# Patient Record
Sex: Male | Born: 1960
Health system: Southern US, Community
[De-identification: ages and names within clinical notes are randomized; demographics above are authoritative.]

## PROBLEM LIST (undated history)

## (undated) DIAGNOSIS — E785 Hyperlipidemia, unspecified: Secondary | ICD-10-CM

## (undated) DIAGNOSIS — M545 Low back pain, unspecified: Secondary | ICD-10-CM

## (undated) DIAGNOSIS — Z86010 Personal history of colonic polyps: Principal | ICD-10-CM

## (undated) DIAGNOSIS — I7121 Aneurysm of the ascending aorta, without rupture: Secondary | ICD-10-CM

## (undated) DIAGNOSIS — M1990 Unspecified osteoarthritis, unspecified site: Secondary | ICD-10-CM

## (undated) HISTORY — DX: Hyperlipidemia, unspecified: E78.5

## (undated) HISTORY — DX: Aneurysm of the ascending aorta, without rupture: I71.21

## (undated) HISTORY — PX: POLYPECTOMY: SHX149

## (undated) HISTORY — DX: Unspecified osteoarthritis, unspecified site: M19.90

## (undated) HISTORY — DX: Low back pain, unspecified: M54.50

## (undated) HISTORY — PX: HERNIA REPAIR: SHX51

## (undated) HISTORY — DX: Personal history of colonic polyps: Z86.010

## (undated) HISTORY — DX: Low back pain: M54.5

---

## 2003-07-14 ENCOUNTER — Inpatient Hospital Stay (HOSPITAL_COMMUNITY): Admission: AD | Admit: 2003-07-14 | Discharge: 2003-07-16 | Payer: Self-pay | Admitting: Family Medicine

## 2003-07-19 ENCOUNTER — Encounter: Admission: RE | Admit: 2003-07-19 | Discharge: 2003-07-19 | Payer: Self-pay | Admitting: Family Medicine

## 2003-09-17 ENCOUNTER — Ambulatory Visit (HOSPITAL_COMMUNITY): Admission: RE | Admit: 2003-09-17 | Discharge: 2003-09-17 | Payer: Self-pay | Admitting: Gastroenterology

## 2003-09-17 HISTORY — PX: COLONOSCOPY: SHX174

## 2012-11-24 ENCOUNTER — Telehealth: Payer: Self-pay | Admitting: Family Medicine

## 2012-11-24 ENCOUNTER — Other Ambulatory Visit: Payer: Self-pay | Admitting: Family Medicine

## 2012-11-24 DIAGNOSIS — M545 Low back pain, unspecified: Secondary | ICD-10-CM

## 2012-11-24 MED ORDER — METAXALONE 800 MG PO TABS: 800.0000 mg | ORAL_TABLET | Freq: Three times a day (TID) | ORAL | Status: DC

## 2012-11-24 NOTE — Telephone Encounter (Signed)
Needs skelaxin for low back pain which I escribed to [facility] and instructed he NTBS if worsening.

## 2012-11-24 NOTE — Telephone Encounter (Signed)
LMTRC twice

## 2013-02-23 ENCOUNTER — Telehealth: Payer: Self-pay | Admitting: Family Medicine

## 2013-02-23 NOTE — Telephone Encounter (Signed)
Atorvastatin 40mg  [facility]  #90

## 2013-02-24 MED ORDER — ATORVASTATIN CALCIUM 40 MG PO TABS: 40.0000 mg | ORAL_TABLET | Freq: Every day | ORAL | Status: DC

## 2013-02-24 NOTE — Telephone Encounter (Signed)
CPE appt in November.  One refill #90 sent

## 2013-03-30 ENCOUNTER — Other Ambulatory Visit: Payer: BC Managed Care – PPO

## 2013-03-30 DIAGNOSIS — Z0000 Encounter for general adult medical examination without abnormal findings: Secondary | ICD-10-CM

## 2013-03-30 LAB — CBC WITH DIFFERENTIAL/PLATELET
Eosinophils Relative: 5 % (ref 0–5)
HCT: 45.3 % (ref 39.0–52.0)
Hemoglobin: 15.4 g/dL (ref 13.0–17.0)
Lymphocytes Relative: 28 % (ref 12–46)
Lymphs Abs: 1.6 10*3/uL (ref 0.7–4.0)
MCV: 87.5 fL (ref 78.0–100.0)
Monocytes Absolute: 0.5 10*3/uL (ref 0.1–1.0)
RBC: 5.18 MIL/uL (ref 4.22–5.81)
WBC: 5.9 10*3/uL (ref 4.0–10.5)

## 2013-03-30 LAB — COMPREHENSIVE METABOLIC PANEL
ALT: 17 U/L (ref 0–53)
BUN: 21 mg/dL (ref 6–23)
CO2: 26 mEq/L (ref 19–32)
Calcium: 9.3 mg/dL (ref 8.4–10.5)
Chloride: 107 mEq/L (ref 96–112)
Creat: 0.82 mg/dL (ref 0.50–1.35)

## 2013-03-30 LAB — LIPID PANEL
Cholesterol: 163 mg/dL (ref 0–200)
Total CHOL/HDL Ratio: 3.5 Ratio

## 2013-04-07 ENCOUNTER — Encounter: Payer: Self-pay | Admitting: Family Medicine

## 2013-04-07 ENCOUNTER — Ambulatory Visit (INDEPENDENT_AMBULATORY_CARE_PROVIDER_SITE_OTHER): Payer: BC Managed Care – PPO | Admitting: Family Medicine

## 2013-04-07 VITALS — BP 110/70 | HR 66 | Temp 97.8°F | Resp 14 | Ht 73.0 in | Wt 223.0 lb

## 2013-04-07 DIAGNOSIS — M545 Low back pain, unspecified: Secondary | ICD-10-CM | POA: Insufficient documentation

## 2013-04-07 DIAGNOSIS — R7303 Prediabetes: Secondary | ICD-10-CM | POA: Insufficient documentation

## 2013-04-07 DIAGNOSIS — Z23 Encounter for immunization: Secondary | ICD-10-CM

## 2013-04-07 DIAGNOSIS — Z0000 Encounter for general adult medical examination without abnormal findings: Secondary | ICD-10-CM

## 2013-04-07 DIAGNOSIS — R7309 Other abnormal glucose: Secondary | ICD-10-CM

## 2013-04-07 MED ORDER — ATORVASTATIN CALCIUM 40 MG PO TABS: 40.0000 mg | ORAL_TABLET | Freq: Every day | ORAL | Status: DC

## 2013-04-07 MED ORDER — METAXALONE 800 MG PO TABS: 800.0000 mg | ORAL_TABLET | Freq: Three times a day (TID) | ORAL | Status: DC

## 2013-04-07 NOTE — Addendum Note (Signed)
Addended by: Legrand Rams B on: 04/07/2013 10:02 AM   Modules accepted: Orders

## 2013-04-07 NOTE — Progress Notes (Signed)
Subjective:    [name], male    DOB: 11-Feb-1961, 52 y.o.   MRN: [id number]  HPI  Patient is here today for a complete physical exam. His lab work is listed below. Is significant for a fasting blood sugar of 111. Otherwise his labs are outstanding. His colonoscopy was in 2005. He is due for a tetanus shot and a flu shot. He is due for his prostate exam. He does have some mild low back pain that migrates and is exacerbated by heavy lifting. He denies any symptoms of sciatica. He denies any dysuria. He denies any hematuria. He denies any fevers. Past Medical History  Diagnosis Date  . Hyperlipidemia   . Low back pain    No current outpatient prescriptions on file prior to visit.   No current facility-administered medications on file prior to visit.   Allergies  Allergen Reactions  . Niaspan [Niacin Er]     Severe flushing   History   Social History  . Marital Status: Married    Spouse Name: N/A    Number of Children: N/A  . Years of Education: N/A   Occupational History  . Not on file.   Social History Main Topics  . Smoking status: Never Smoker   . Smokeless tobacco: Never Used  . Alcohol Use: No  . Drug Use: No  . Sexual Activity: Yes     Comment: married.  Works in Data processing manager, Set designer   Other Topics Concern  . Not on file   Social History Narrative  . No narrative on file   Family History  Problem Relation Age of Onset  . Alcohol abuse Brother   . Hyperlipidemia Brother      Review of Systems  All other systems reviewed and are negative.       Objective:   Physical Exam  Vitals reviewed. Constitutional: He is oriented to person, place, and time. He appears well-developed and well-nourished. No distress.  HENT:  Head: Normocephalic and atraumatic.  Right Ear: External ear normal.  Left Ear: External ear normal.  Nose: Nose normal.  Mouth/Throat: Oropharynx is clear and moist. No oropharyngeal exudate.  Eyes: Conjunctivae  and EOM are normal. Pupils are equal, round, and reactive to light. Right eye exhibits no discharge. Left eye exhibits no discharge. No scleral icterus.  Neck: Normal range of motion. Neck supple. No JVD present. No thyromegaly present.  Cardiovascular: Normal rate, regular rhythm and normal heart sounds.  Exam reveals no gallop and no friction rub.   No murmur heard. Pulmonary/Chest: Effort normal and breath sounds normal. No stridor. No respiratory distress. He has no wheezes. He has no rales. He exhibits no tenderness.  Abdominal: Soft. Bowel sounds are normal. He exhibits no distension and no mass. There is no tenderness. There is no rebound and no guarding.  Genitourinary: Rectum normal, prostate normal and penis normal. Guaiac negative stool. No penile tenderness.  Musculoskeletal: Normal range of motion. He exhibits no edema and no tenderness.  Lymphadenopathy:    He has no cervical adenopathy.  Neurological: He is alert and oriented to person, place, and time. He has normal reflexes. He displays normal reflexes. No cranial nerve deficit. He exhibits normal muscle tone. Coordination normal.  Skin: Skin is warm. Rash noted. He is not diaphoretic. No erythema. No pallor.  Psychiatric: He has a normal mood and affect. His behavior is normal. Judgment and thought content normal.   there is a 2 cm erythematous superficial sore on the  left side of his gluteal cleft.  It appears to be irritated.        Assessment & Plan:  1. Low back pain Sounds musculoskeletal. He can take Skelaxin 800 mg 3 times a day as needed for pain. - metaxalone (SKELAXIN) 800 MG tablet; Take 1 tablet (800 mg total) by mouth 3 (three) times daily. As needed for low back pain  Dispense: 30 tablet; Refill: 0  2. Routine general medical examination at a health care facility Patient's physical exam is normal. Give the patient a flu shot as well as aTdAP.  Also send the patient home with fecal occult blood cards. If  negative he will not need a colonoscopy until next year. I recommended 10 pounds weight loss, 15 minutes a day of aerobic exercise. We will decrease his Lipitor to 20 mg a day. Recheck his blood sugar and fasting lipid panel in 6 months.  I recommended he apply Polysporin daily to the irritated area on his left gluteal cleft 1 month. Recheck then. If persistent I would recommend shave biopsy. - Fecal occult blood, imunochemical  3. Prediabetes Stent 5-10 minutes discussing low carbohydrate diet and increasing aerobic exercise. Recheck blood sugar in 6 months. Decrease Lipitor to 20 mg a day.

## 2013-08-11 ENCOUNTER — Other Ambulatory Visit: Payer: Self-pay | Admitting: Family Medicine

## 2013-08-11 ENCOUNTER — Encounter: Payer: Self-pay | Admitting: Family Medicine

## 2013-08-11 DIAGNOSIS — M545 Low back pain, unspecified: Secondary | ICD-10-CM

## 2013-08-11 MED ORDER — METAXALONE 800 MG PO TABS: 800.0000 mg | ORAL_TABLET | Freq: Three times a day (TID) | ORAL | Status: DC

## 2013-10-05 ENCOUNTER — Other Ambulatory Visit: Payer: BC Managed Care – PPO

## 2013-10-05 DIAGNOSIS — Z79899 Other long term (current) drug therapy: Secondary | ICD-10-CM

## 2013-10-05 DIAGNOSIS — Z0000 Encounter for general adult medical examination without abnormal findings: Secondary | ICD-10-CM

## 2013-10-05 LAB — CBC WITH DIFFERENTIAL/PLATELET
Basophils Absolute: 0.1 10*3/uL (ref 0.0–0.1)
Basophils Relative: 1 % (ref 0–1)
EOS PCT: 5 % (ref 0–5)
Eosinophils Absolute: 0.3 10*3/uL (ref 0.0–0.7)
HEMATOCRIT: 41.4 % (ref 39.0–52.0)
HEMOGLOBIN: 13.9 g/dL (ref 13.0–17.0)
LYMPHS PCT: 30 % (ref 12–46)
Lymphs Abs: 1.5 10*3/uL (ref 0.7–4.0)
MCH: 29.1 pg (ref 26.0–34.0)
MCHC: 33.6 g/dL (ref 30.0–36.0)
MCV: 86.6 fL (ref 78.0–100.0)
MONO ABS: 0.4 10*3/uL (ref 0.1–1.0)
MONOS PCT: 8 % (ref 3–12)
NEUTROS ABS: 2.8 10*3/uL (ref 1.7–7.7)
Neutrophils Relative %: 56 % (ref 43–77)
Platelets: 198 10*3/uL (ref 150–400)
RBC: 4.78 MIL/uL (ref 4.22–5.81)
RDW: 14.1 % (ref 11.5–15.5)
WBC: 5 10*3/uL (ref 4.0–10.5)

## 2013-10-05 LAB — COMPLETE METABOLIC PANEL WITH GFR
ALBUMIN: 4.3 g/dL (ref 3.5–5.2)
ALT: 16 U/L (ref 0–53)
AST: 17 U/L (ref 0–37)
Alkaline Phosphatase: 49 U/L (ref 39–117)
BUN: 18 mg/dL (ref 6–23)
CALCIUM: 9.2 mg/dL (ref 8.4–10.5)
CHLORIDE: 107 meq/L (ref 96–112)
CO2: 25 meq/L (ref 19–32)
CREATININE: 0.83 mg/dL (ref 0.50–1.35)
GLUCOSE: 101 mg/dL — AB (ref 70–99)
POTASSIUM: 4.1 meq/L (ref 3.5–5.3)
Sodium: 142 mEq/L (ref 135–145)
Total Bilirubin: 0.9 mg/dL (ref 0.2–1.2)
Total Protein: 6.2 g/dL (ref 6.0–8.3)

## 2013-10-05 LAB — LIPID PANEL
Cholesterol: 155 mg/dL (ref 0–200)
HDL: 44 mg/dL (ref >39)
LDL CALC: 97 mg/dL (ref 0–99)
TRIGLYCERIDES: 71 mg/dL (ref <150)
Total CHOL/HDL Ratio: 3.5 Ratio
VLDL: 14 mg/dL (ref 0–40)

## 2013-10-06 ENCOUNTER — Encounter: Payer: Self-pay | Admitting: Family Medicine

## 2013-10-29 ENCOUNTER — Encounter: Payer: Self-pay | Admitting: Family Medicine

## 2013-10-29 DIAGNOSIS — E785 Hyperlipidemia, unspecified: Secondary | ICD-10-CM

## 2013-10-30 MED ORDER — ATORVASTATIN CALCIUM 20 MG PO TABS: 20.0000 mg | ORAL_TABLET | Freq: Every day | ORAL | Status: DC

## 2014-02-18 ENCOUNTER — Ambulatory Visit (INDEPENDENT_AMBULATORY_CARE_PROVIDER_SITE_OTHER): Payer: Medicare Other | Admitting: Family Medicine

## 2014-02-18 ENCOUNTER — Encounter: Payer: Self-pay | Admitting: Family Medicine

## 2014-02-18 VITALS — BP 112/74 | HR 80 | Temp 98.2°F | Resp 14 | Ht 73.0 in | Wt 223.0 lb

## 2014-02-18 DIAGNOSIS — Z23 Encounter for immunization: Secondary | ICD-10-CM

## 2014-02-18 DIAGNOSIS — S93601A Unspecified sprain of right foot, initial encounter: Secondary | ICD-10-CM

## 2014-02-18 MED ORDER — KETOROLAC TROMETHAMINE 10 MG PO TABS: 10.0000 mg | ORAL_TABLET | Freq: Four times a day (QID) | ORAL | Status: DC | PRN

## 2014-02-18 NOTE — Progress Notes (Signed)
   Subjective:    [name], male    DOB: [birth year], 53 y.o.   MRN: [id number]  HPI One week ago, the patient performed a substantial amount of walking while he visited his son in college. He had to walk several miles and climb steps all day long.  Ever since his foot has been edematous swollen and tender. He is primarily tender and sore and swollen over the second third and fourth metatarsal bodies and near the metatarsal tarsal joints. There is no erythema or warmth. There is visible swelling and tenderness to palpation in this area. He has full range of motion in his toes and his ankle. There is no bruising. Past Medical History  Diagnosis Date  . Hyperlipidemia   . Low back pain    No past surgical history on file. Current Outpatient Prescriptions on File Prior to Visit  Medication Sig Dispense Refill  . aspirin 81 MG tablet Take 81 mg by mouth daily.      Marland Kitchen atorvastatin (LIPITOR) 20 MG tablet Take 1 tablet (20 mg total) by mouth daily.  90 tablet  3   No current facility-administered medications on file prior to visit.   Allergies  Allergen Reactions  . Niaspan [Niacin Er]     Severe flushing   History   Social History  . Marital Status: Married    Spouse Name: N/A    Number of Children: N/A  . Years of Education: N/A   Occupational History  . Not on file.   Social History Main Topics  . Smoking status: Never Smoker   . Smokeless tobacco: Never Used  . Alcohol Use: No  . Drug Use: No  . Sexual Activity: Yes     Comment: married.  Works in Therapist, occupational, Psychologist, educational   Other Topics Concern  . Not on file   Social History Narrative  . No narrative on file      Review of Systems  All other systems reviewed and are negative.      Objective:   Physical Exam  Vitals reviewed. Cardiovascular: Normal rate and regular rhythm.   Pulmonary/Chest: Effort normal and breath sounds normal.  Musculoskeletal:       Right foot: He exhibits  tenderness, bony tenderness and swelling. He exhibits no deformity.          Assessment & Plan:  Foot sprain, right, initial encounter - Plan: ketorolac (TORADOL) 10 MG tablet   I suspect the patient sprained his midfoot. Recommended rest and elevation through the weekend. I recommended ice to 3 times a day for 10-15 minutes. I recommended Toradol 10 mg every 6 hours for no more than 5 days. The patient is no better next week, I would obtain an x-ray of the foot to rule out a stress fracture.  Patient has no history of gout and there is no evidence of such at this time. There is also no evidence of an infection.

## 2014-02-18 NOTE — Addendum Note (Signed)
Addended by: Shary Decamp B on: 02/18/2014 01:09 PM   Modules accepted: Orders

## 2014-04-05 ENCOUNTER — Other Ambulatory Visit: Payer: Medicare Other

## 2014-04-05 DIAGNOSIS — R7303 Prediabetes: Secondary | ICD-10-CM

## 2014-04-05 DIAGNOSIS — Z79899 Other long term (current) drug therapy: Secondary | ICD-10-CM

## 2014-04-05 DIAGNOSIS — Z0000 Encounter for general adult medical examination without abnormal findings: Secondary | ICD-10-CM

## 2014-04-05 DIAGNOSIS — E785 Hyperlipidemia, unspecified: Secondary | ICD-10-CM

## 2014-04-05 DIAGNOSIS — Z125 Encounter for screening for malignant neoplasm of prostate: Secondary | ICD-10-CM

## 2014-04-05 LAB — LIPID PANEL
CHOL/HDL RATIO: 3.4 ratio
Cholesterol: 148 mg/dL (ref 0–200)
HDL: 43 mg/dL (ref >39)
LDL Cholesterol: 88 mg/dL (ref 0–99)
Triglycerides: 84 mg/dL (ref <150)
VLDL: 17 mg/dL (ref 0–40)

## 2014-04-05 LAB — COMPLETE METABOLIC PANEL WITH GFR
ALK PHOS: 60 U/L (ref 39–117)
ALT: 16 U/L (ref 0–53)
AST: 19 U/L (ref 0–37)
Albumin: 4.4 g/dL (ref 3.5–5.2)
BILIRUBIN TOTAL: 1.1 mg/dL (ref 0.2–1.2)
BUN: 25 mg/dL — ABNORMAL HIGH (ref 6–23)
CO2: 26 mEq/L (ref 19–32)
Calcium: 9.2 mg/dL (ref 8.4–10.5)
Chloride: 105 mEq/L (ref 96–112)
Creat: 0.78 mg/dL (ref 0.50–1.35)
GFR, Est African American: >89 mL/min
GLUCOSE: 107 mg/dL — AB (ref 70–99)
Potassium: 4.8 mEq/L (ref 3.5–5.3)
SODIUM: 139 meq/L (ref 135–145)
TOTAL PROTEIN: 6.5 g/dL (ref 6.0–8.3)

## 2014-04-05 LAB — CBC WITH DIFFERENTIAL/PLATELET
BASOS ABS: 0 10*3/uL (ref 0.0–0.1)
Basophils Relative: 0 % (ref 0–1)
EOS ABS: 0.3 10*3/uL (ref 0.0–0.7)
EOS PCT: 5 % (ref 0–5)
HCT: 43.1 % (ref 39.0–52.0)
Hemoglobin: 14.3 g/dL (ref 13.0–17.0)
LYMPHS PCT: 30 % (ref 12–46)
Lymphs Abs: 1.5 10*3/uL (ref 0.7–4.0)
MCH: 29.5 pg (ref 26.0–34.0)
MCHC: 33.2 g/dL (ref 30.0–36.0)
MCV: 88.9 fL (ref 78.0–100.0)
Monocytes Absolute: 0.4 10*3/uL (ref 0.1–1.0)
Monocytes Relative: 8 % (ref 3–12)
Neutro Abs: 2.9 10*3/uL (ref 1.7–7.7)
Neutrophils Relative %: 57 % (ref 43–77)
PLATELETS: 194 10*3/uL (ref 150–400)
RBC: 4.85 MIL/uL (ref 4.22–5.81)
RDW: 14.1 % (ref 11.5–15.5)
WBC: 5.1 10*3/uL (ref 4.0–10.5)

## 2014-04-05 LAB — HEMOGLOBIN A1C
Hgb A1c MFr Bld: 5.6 % (ref <5.7)
Mean Plasma Glucose: 114 mg/dL (ref <117)

## 2014-04-05 LAB — TSH: TSH: 1.422 u[IU]/mL (ref 0.350–4.500)

## 2014-04-06 LAB — PSA, MEDICARE: PSA: 1.66 ng/mL

## 2014-04-12 ENCOUNTER — Encounter: Payer: Self-pay | Admitting: Family Medicine

## 2014-04-12 ENCOUNTER — Ambulatory Visit (INDEPENDENT_AMBULATORY_CARE_PROVIDER_SITE_OTHER): Payer: Medicare Other | Admitting: Family Medicine

## 2014-04-12 VITALS — BP 120/76 | HR 78 | Temp 98.4°F | Resp 16 | Ht 73.0 in | Wt 222.0 lb

## 2014-04-12 DIAGNOSIS — Z0000 Encounter for general adult medical examination without abnormal findings: Secondary | ICD-10-CM

## 2014-04-12 NOTE — Progress Notes (Signed)
Subjective:    [name], male    DOB: 11-Jun-1960, 53 y.o.   MRN: [id number]  HPI Patient is here today for complete physical exam. He does complain of some numbness in his right hand with repetitive use. 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 He denies any neck pain or arm pain. He denies any pain in his hand. The numbness is limited distal to his right wrist. He is due for a colonoscopy. Otherwise the patient is doing relatively well. He continues to have some pain and swelling in his right forefoot. I diagnosed patient with a sprain in October. He would not like to get an x-ray at this time. The pain is improving he would like to give it more time. His most recent lab work as listed below. His flu shot and his tetanus shot are up-to-date. Lab on 04/05/2014  Component Date Value Ref Range Status  . Sodium 04/05/2014 139  135 - 145 mEq/L Final  . Potassium 04/05/2014 4.8  3.5 - 5.3 mEq/L Final  . Chloride 04/05/2014 105  96 - 112 mEq/L Final  . CO2 04/05/2014 26  19 - 32 mEq/L Final  . Glucose, Bld 04/05/2014 107* 70 - 99 mg/dL Final  . BUN 04/05/2014 25* 6 - 23 mg/dL Final  . Creat 04/05/2014 0.78  0.50 - 1.35 mg/dL Final  . Total Bilirubin 04/05/2014 1.1  0.2 - 1.2 mg/dL Final  . Alkaline Phosphatase 04/05/2014 60  39 - 117 U/L Final  . AST 04/05/2014 19  0 - 37 U/L Final  . ALT 04/05/2014 16  0 - 53 U/L Final  . Total Protein 04/05/2014 6.5  6.0 - 8.3 g/dL Final  . Albumin 04/05/2014 4.4  3.5 - 5.2 g/dL Final  . Calcium 04/05/2014 9.2  8.4 - 10.5 mg/dL Final  . GFR, Est African American 04/05/2014 >89   Final  . GFR, Est Non African American 04/05/2014 >89   Final   Comment:   The estimated GFR is a calculation valid for adults (>=58 years old) that uses the CKD-EPI algorithm to adjust for age and sex. It is   not to be used for children, pregnant women, hospitalized patients,    patients on dialysis, or with rapidly changing kidney  function. According to the NKDEP, eGFR >89 is normal, 60-89 shows mild impairment, 30-59 shows moderate impairment, 15-29 shows severe impairment and <15 is ESRD.     Marland Kitchen Cholesterol 04/05/2014 148  0 - 200 mg/dL Final   Comment: ATP III Classification:       < 200        mg/dL        Desirable      200 - 239     mg/dL        Borderline High      >= 240        mg/dL        High     . Triglycerides 04/05/2014 84  <150 mg/dL Final  . HDL 04/05/2014 43  >39 mg/dL Final  . Total CHOL/HDL Ratio 04/05/2014 3.4   Final  . VLDL 04/05/2014 17  0 - 40 mg/dL Final  . LDL Cholesterol 04/05/2014 88  0 - 99 mg/dL Final   Comment:   Total Cholesterol/HDL Ratio:CHD Risk                        Coronary Heart Disease Risk Table  Men       Women          1/2 Average Risk              3.4        3.3              Average Risk              5.0        4.4           2X Average Risk              9.6        7.1           3X Average Risk             23.4       11.0 Use the calculated Patient Ratio above and the CHD Risk table  to determine the patient's CHD Risk. ATP III Classification (LDL):       < 100        mg/dL         Optimal      100 - 129     mg/dL         Near or Above Optimal      130 - 159     mg/dL         Borderline High      160 - 189     mg/dL         High       > 190        mg/dL         Very High     . WBC 04/05/2014 5.1  4.0 - 10.5 K/uL Final  . RBC 04/05/2014 4.85  4.22 - 5.81 MIL/uL Final  . Hemoglobin 04/05/2014 14.3  13.0 - 17.0 g/dL Final  . HCT 04/05/2014 43.1  39.0 - 52.0 % Final  . MCV 04/05/2014 88.9  78.0 - 100.0 fL Final  . MCH 04/05/2014 29.5  26.0 - 34.0 pg Final  . MCHC 04/05/2014 33.2  30.0 - 36.0 g/dL Final  . RDW 04/05/2014 14.1  11.5 - 15.5 % Final  . Platelets 04/05/2014 194  150 - 400 K/uL Final  . Neutrophils Relative % 04/05/2014 57  43 - 77 % Final  . Neutro Abs 04/05/2014 2.9  1.7 - 7.7 K/uL Final  . Lymphocytes  Relative 04/05/2014 30  12 - 46 % Final  . Lymphs Abs 04/05/2014 1.5  0.7 - 4.0 K/uL Final  . Monocytes Relative 04/05/2014 8  3 - 12 % Final  . Monocytes Absolute 04/05/2014 0.4  0.1 - 1.0 K/uL Final  . Eosinophils Relative 04/05/2014 5  0 - 5 % Final  . Eosinophils Absolute 04/05/2014 0.3  0.0 - 0.7 K/uL Final  . Basophils Relative 04/05/2014 0  0 - 1 % Final  . Basophils Absolute 04/05/2014 0.0  0.0 - 0.1 K/uL Final  . Smear Review 04/05/2014 Criteria for review not met   Final  . TSH 04/05/2014 1.422  0.350 - 4.500 uIU/mL Final  . Hgb A1c MFr Bld 04/05/2014 5.6  <5.7 % Final   Comment:  According to the ADA Clinical Practice Recommendations for 2011, when HbA1c is used as a screening test:     >=6.5%   Diagnostic of Diabetes Mellitus            (if abnormal result is confirmed)   5.7-6.4%   Increased risk of developing Diabetes Mellitus   References:Diagnosis and Classification of Diabetes Mellitus,Diabetes DUKG,2542,70(WCBJS 1):S62-S69 and Standards of Medical Care in         Diabetes - 2011,Diabetes EGBT,5176,16 (Suppl 1):S11-S61.     . Mean Plasma Glucose 04/05/2014 114  <117 mg/dL Final  . PSA 04/05/2014 1.66  <=4.00 ng/mL Final   Comment: Test Methodology: ECLIA PSA (Electrochemiluminescence Immunoassay)   For PSA values from 2.5-4.0, particularly in younger men <27 years old, the AUA and NCCN suggest testing for % Free PSA (3515) and evaluation of the rate of increase in PSA (PSA velocity).    Past Medical History  Diagnosis Date  . Hyperlipidemia   . Low back pain    No past surgical history on file. Current Outpatient Prescriptions on File Prior to Visit  Medication Sig Dispense Refill  . aspirin 81 MG tablet Take 81 mg by mouth daily.    Marland Kitchen atorvastatin (LIPITOR) 20 MG tablet Take 1 tablet (20 mg total) by mouth daily. 90 tablet 3   No current facility-administered medications on file prior  to visit.   Allergies  Allergen Reactions  . Niaspan [Niacin Er]     Severe flushing   History   Social History  . Marital Status: Married    Spouse Name: N/A    Number of Children: N/A  . Years of Education: N/A   Occupational History  . Not on file.   Social History Main Topics  . Smoking status: Never Smoker   . Smokeless tobacco: Never Used  . Alcohol Use: No  . Drug Use: No  . Sexual Activity: Yes     Comment: married.  Works in Therapist, occupational, Psychologist, educational   Other Topics Concern  . Not on file   Social History Narrative   Family History  Problem Relation Age of Onset  . Alcohol abuse Brother   . Hyperlipidemia Brother       Review of Systems  All other systems reviewed and are negative.      Objective:   Physical Exam  Constitutional: He is oriented to person, place, and time. He appears well-developed and well-nourished. No distress.  HENT:  Head: Normocephalic and atraumatic.  Right Ear: External ear normal.  Left Ear: External ear normal.  Nose: Nose normal.  Mouth/Throat: Oropharynx is clear and moist. No oropharyngeal exudate.  Eyes: Conjunctivae and EOM are normal. Pupils are equal, round, and reactive to light. Right eye exhibits no discharge. Left eye exhibits no discharge. No scleral icterus.  Neck: Normal range of motion. Neck supple. No JVD present. No tracheal deviation present. No thyromegaly present.  Cardiovascular: Normal rate, regular rhythm, normal heart sounds and intact distal pulses.  Exam reveals no gallop and no friction rub.   No murmur heard. Pulmonary/Chest: Effort normal and breath sounds normal. No stridor. No respiratory distress. He has no wheezes. He has no rales. He exhibits no tenderness.  Abdominal: Soft. Bowel sounds are normal. He exhibits no distension and no mass. There is no tenderness. There is no rebound and no guarding.  Genitourinary: Rectum normal, prostate normal and penis normal.  Musculoskeletal: Normal  range of motion. He exhibits no edema or tenderness.  Lymphadenopathy:  He has no cervical adenopathy.  Neurological: He is alert and oriented to person, place, and time. He has normal reflexes. He displays normal reflexes. No cranial nerve deficit. He exhibits normal muscle tone. Coordination normal.  Skin: Skin is warm. No rash noted. He is not diaphoretic. No erythema. No pallor.  Psychiatric: He has a normal mood and affect. His behavior is normal. Judgment and thought content normal.  Vitals reviewed.         Assessment & Plan:  Routine general medical examination at a health care facility  Patient's physical exam is up-to-date. His exam is normal. His lab work is significant only for mildly elevated sugar. I recommended 10 pounds of weight loss to try to address and a low carbohydrate diet. I will schedule the patient for colonoscopy. His immunizations are up-to-date. I believe the patient's symptoms are consistent with a sprain of his forefoot and also carpal tunnel syndrome. I have recommended rest ice elevation and compression for his foot. I recommended a cockup wrist splint for his wrist on his right hand. I have also given the patient samples of Cialis 20 mg by mouth daily when necessary for erectile dysfunction.

## 2014-04-12 NOTE — Addendum Note (Signed)
Addended by: Jenna Luo on: 04/12/2014 09:15 AM   Modules accepted: Orders

## 2014-04-22 ENCOUNTER — Encounter: Payer: Self-pay | Admitting: Family Medicine

## 2014-05-03 ENCOUNTER — Encounter: Payer: Self-pay | Admitting: Internal Medicine

## 2014-06-18 ENCOUNTER — Ambulatory Visit (AMBULATORY_SURGERY_CENTER): Payer: Self-pay | Admitting: *Deleted

## 2014-06-18 VITALS — Ht 74.0 in | Wt 224.0 lb

## 2014-06-18 DIAGNOSIS — Z1211 Encounter for screening for malignant neoplasm of colon: Secondary | ICD-10-CM

## 2014-06-18 DIAGNOSIS — Z800 Family history of malignant neoplasm of digestive organs: Secondary | ICD-10-CM

## 2014-06-18 NOTE — Progress Notes (Signed)
No egg or soy allergy  No home 02 use  No diet pills  No issues with past colon sedation  Pt declined emmi

## 2014-06-22 ENCOUNTER — Ambulatory Visit (INDEPENDENT_AMBULATORY_CARE_PROVIDER_SITE_OTHER): Payer: BLUE CROSS/BLUE SHIELD | Admitting: Family Medicine

## 2014-06-22 ENCOUNTER — Encounter: Payer: Self-pay | Admitting: Family Medicine

## 2014-06-22 VITALS — BP 110/70 | HR 76 | Temp 98.6°F | Resp 16 | Ht 73.0 in | Wt 229.0 lb

## 2014-06-22 DIAGNOSIS — L00 Staphylococcal scalded skin syndrome: Secondary | ICD-10-CM

## 2014-06-22 MED ORDER — CEPHALEXIN 500 MG PO CAPS
500.0000 mg | ORAL_CAPSULE | Freq: Four times a day (QID) | ORAL | Status: DC
Start: 2014-06-22 — End: 2014-07-02

## 2014-06-22 NOTE — Progress Notes (Signed)
Subjective:    [name], male    DOB: [birth year], 54 y.o.   MRN: [id number]  HPI Patient has a 2 day history of pain on the right side of his scrotum. It began with some irritation. Yesterday he became extremely erythematous warm and painful. Today the skin on the right side of scrotum is starting to crack and peel similar to skin syndrome or a burn. There is no erythema on the left side of his scrotum. There is no exfoliation on the left side of his scrotum. There are no visible vesicles or blisters or papules. His wife does not have a rash. There is no history of herpes in the family. He denies any dysuria or hematuria. He denies any penile discharge. Differential diagnosis includes scalded skin syndrome from strep or staph, contact dermatitis from an unknown source, unusual shingles, possibly herpes. Past Medical History  Diagnosis Date  . Hyperlipidemia   . Low back pain    Past Surgical History  Procedure Laterality Date  . Hernia repair    . Colonoscopy  09-17-2003    normal dr Wynetta Emery   Current Outpatient Prescriptions on File Prior to Visit  Medication Sig Dispense Refill  . aspirin 81 MG tablet Take 81 mg by mouth daily.    Marland Kitchen atorvastatin (LIPITOR) 20 MG tablet Take 1 tablet (20 mg total) by mouth daily. 90 tablet 3  . Multiple Vitamin (MULTIVITAMIN) tablet Take 1 tablet by mouth daily.    Marland Kitchen OVER THE COUNTER MEDICATION Take 1 capsule by mouth daily. For joints     No current facility-administered medications on file prior to visit.   Allergies  Allergen Reactions  . Niaspan [Niacin Er]     Severe flushing   History   Social History  . Marital Status: Married    Spouse Name: N/A    Number of Children: N/A  . Years of Education: N/A   Occupational History  . Not on file.   Social History Main Topics  . Smoking status: Never Smoker   . Smokeless tobacco: Never Used  . Alcohol Use: No  . Drug Use: No  . Sexual Activity: Yes     Comment:  married.  Works in Therapist, occupational, Psychologist, educational   Other Topics Concern  . Not on file   Social History Narrative      Review of Systems  All other systems reviewed and are negative.      Objective:   Physical Exam  Cardiovascular: Normal rate, regular rhythm and normal heart sounds.   No murmur heard. Pulmonary/Chest: Effort normal and breath sounds normal. No respiratory distress. He has no wheezes. He has no rales.  Abdominal: Soft. Bowel sounds are normal. He exhibits no distension. There is no tenderness. There is no rebound and no guarding.  Skin: Skin is warm. Rash noted. There is erythema.  Vitals reviewed.  right side of the scrotum is erythematous tender and warm with peeling scalded skin        Assessment & Plan:  Scalded skin syndrome - Plan: cephALEXin (KEFLEX) 500 MG capsule  Differential diagnosis includes scalded skin syndrome, contact dermatitis, shingles, unusual herpes, Candida intertrigo with a secondary bacterial infection. I will treat the patient as possible scalded skin syndrome with Keflex 500 mg by mouth 3 times a day to cover staph and strep for 7 days. If patient's symptoms are not improving over the next 48 hours consider putting the patient on Lotrisone cream to cover possible Candida  intertrigo as well as a contact dermatitis.

## 2014-07-02 ENCOUNTER — Encounter: Payer: Self-pay | Admitting: Internal Medicine

## 2014-07-02 ENCOUNTER — Ambulatory Visit (AMBULATORY_SURGERY_CENTER): Payer: BLUE CROSS/BLUE SHIELD | Admitting: Internal Medicine

## 2014-07-02 VITALS — BP 107/69 | HR 55 | Temp 96.8°F | Resp 19 | Ht 74.0 in | Wt 224.0 lb

## 2014-07-02 DIAGNOSIS — Z1211 Encounter for screening for malignant neoplasm of colon: Secondary | ICD-10-CM

## 2014-07-02 DIAGNOSIS — D123 Benign neoplasm of transverse colon: Secondary | ICD-10-CM

## 2014-07-02 MED ORDER — SODIUM CHLORIDE 0.9 % IV SOLN: 500.0000 mL | INTRAVENOUS | Status: DC

## 2014-07-02 NOTE — Op Note (Signed)
[facility]  [address] [place], [id number]   COLONOSCOPY PROCEDURE REPORT  PATIENT: Mike Perez, Mike Perez  MR#: [id number] BIRTHDATE: 07/11/1960 , 78  yrs. old GENDER: male ENDOSCOPIST: Gatha Mayer, MD, [facility] PROCEDURE DATE:  07/02/2014 PROCEDURE:   Colonoscopy with snare polypectomy First Screening Colonoscopy - Avg.  risk and is 50 yrs.  old or older Yes.  Prior Negative Screening - Now for repeat screening. N/A  History of Adenoma - Now for follow-up colonoscopy & has been > or = to 3 yrs.  N/A  Polyps Removed Today? Yes. ASA CLASS:   Class II INDICATIONS:average risk patient for colorectal cancer. MEDICATIONS: Propofol 230 mg IV and Monitored anesthesia care  DESCRIPTION OF PROCEDURE:   After the risks benefits and alternatives of the procedure were thoroughly explained, informed consent was obtained.  The digital rectal exam revealed no abnormalities of the rectum, revealed no prostatic nodules, and revealed the prostate was not enlarged.   The LB [id number] endoscope was introduced through the anus and advanced to the cecum, which was identified by both the appendix and ileocecal valve. No adverse events experienced.   The quality of the prep was excellent, using MiraLax  The instrument was then slowly withdrawn as the colon was fully examined.      COLON FINDINGS: 1) Diminutive transverse colon polyp removed by cold snare and sent to pathology. 2) Sigmoid diverticulosis 3) Otherwise normal colon, excellent prep - first screening.  Retroflexed views revealed no abnormalities. The time to cecum=1 minutes 52 seconds. Withdrawal time=8 minutes 44 seconds.  The scope was withdrawn and the procedure completed. COMPLICATIONS: There were no immediate complications.  ENDOSCOPIC IMPRESSION: 1) Diminutive transverse colon polyp removed by cold snare and sent to pathology. 2) Sigmoid diverticulosis 3) Otherwise normal colon, excellent prep - first  screening  RECOMMENDATIONS: Timing of repeat colonoscopy will be determined by pathology findings.  eSigned:  Gatha Mayer, MD, [facility] 07/02/2014 11:39 AM   cc: Margaretmary Eddy, MD and The Patient

## 2014-07-02 NOTE — Patient Instructions (Signed)
YOU HAD AN ENDOSCOPIC PROCEDURE TODAY AT THE [facility]: Refer to the procedure report that was given to you for any specific questions about what was found during the examination.  If the procedure report does not answer your questions, please call your gastroenterologist to clarify.  If you requested that your care partner not be given the details of your procedure findings, then the procedure report has been included in a sealed envelope for you to review at your convenience later.  YOU SHOULD EXPECT: Some feelings of bloating in the abdomen. Passage of more gas than usual.  Walking can help get rid of the air that was put into your GI tract during the procedure and reduce the bloating. If you had a lower endoscopy (such as a colonoscopy or flexible sigmoidoscopy) you may notice spotting of blood in your stool or on the toilet paper. If you underwent a bowel prep for your procedure, then you may not have a normal bowel movement for a few days.  DIET: Your first meal following the procedure should be a light meal and then it is ok to progress to your normal diet.  A half-sandwich or bowl of soup is an example of a good first meal.  Heavy or fried foods are harder to digest and may make you feel nauseous or bloated.  Likewise meals heavy in dairy and vegetables can cause extra gas to form and this can also increase the bloating.  Drink plenty of fluids but you should avoid alcoholic beverages for 24 hours.  ACTIVITY: Your care partner should take you home directly after the procedure.  You should plan to take it easy, moving slowly for the rest of the day.  You can resume normal activity the day after the procedure however you should NOT DRIVE or use heavy machinery for 24 hours (because of the sedation medicines used during the test).    SYMPTOMS TO REPORT IMMEDIATELY: A gastroenterologist can be reached at any hour.  During normal business hours, 8:30 AM to 5:00 PM Monday through Friday,  call [phone].  After hours and on weekends, please call the GI answering service at [phone] who will take a message and have the physician on call contact you.   Following lower endoscopy (colonoscopy or flexible sigmoidoscopy):  Excessive amounts of blood in the stool  Significant tenderness or worsening of abdominal pains  Swelling of the abdomen that is new, acute  Fever of 100F or higher  FOLLOW UP: If any biopsies were taken you will be contacted by phone or by letter within the next 1-3 weeks.  Call your gastroenterologist if you have not heard about the biopsies in 3 weeks.  Our staff will call the home number listed on your records the next business day following your procedure to check on you and address any questions or concerns that you may have at that time regarding the information given to you following your procedure. This is a courtesy call and so if there is no answer at the home number and we have not heard from you through the emergency physician on call, we will assume that you have returned to your regular daily activities without incident.  SIGNATURES/CONFIDENTIALITY: You and/or your care partner have signed paperwork which will be entered into your electronic medical record.  These signatures attest to the fact that that the information above on your After Visit Summary has been reviewed and is understood.  Full responsibility of the confidentiality of this   discharge information lies with you and/or your care-partner.  Read the handouts given to you by your recovery room nurse. 

## 2014-07-02 NOTE — Progress Notes (Signed)
Called to room to assist during endoscopic procedure.  Patient ID and intended procedure confirmed with present staff. Received instructions for my participation in the procedure from the performing physician.  

## 2014-07-02 NOTE — Progress Notes (Signed)
To recovery, report to Hodges, RN, VSS 

## 2014-07-05 ENCOUNTER — Telehealth: Payer: Self-pay | Admitting: *Deleted

## 2014-07-05 NOTE — Telephone Encounter (Signed)
  Follow up Call-  Call back number 07/02/2014  Post procedure Call Back phone  # [phone]  Permission to leave phone message No  comments no voicemail     Patient questions:  Do you have a fever, pain , or abdominal swelling? No. Pain Score  0 *  Have you tolerated food without any problems? yes  Have you been able to return to your normal activities? Yes.    Do you have any questions about your discharge instructions: Diet   No. Medications  No. Follow up visit  No.  Do you have questions or concerns about your Care? No.  Actions: * If pain score is 4 or above: No action needed, pain <4.

## 2014-07-06 ENCOUNTER — Encounter: Payer: Self-pay | Admitting: Gastroenterology

## 2014-07-08 ENCOUNTER — Encounter: Payer: Self-pay | Admitting: Internal Medicine

## 2014-07-08 DIAGNOSIS — Z86010 Personal history of colonic polyps: Secondary | ICD-10-CM

## 2014-07-08 DIAGNOSIS — Z860101 Personal history of adenomatous and serrated colon polyps: Secondary | ICD-10-CM | POA: Insufficient documentation

## 2014-07-08 HISTORY — DX: Personal history of adenomatous and serrated colon polyps: Z86.0101

## 2014-07-08 HISTORY — DX: Personal history of colonic polyps: Z86.010

## 2014-07-08 NOTE — Progress Notes (Signed)
Quick Note:  Diminutive adenoma Repeat colonoscopy about 2021 ______

## 2014-10-14 ENCOUNTER — Ambulatory Visit (INDEPENDENT_AMBULATORY_CARE_PROVIDER_SITE_OTHER): Payer: BLUE CROSS/BLUE SHIELD | Admitting: Family Medicine

## 2014-10-14 ENCOUNTER — Encounter: Payer: Self-pay | Admitting: Family Medicine

## 2014-10-14 ENCOUNTER — Ambulatory Visit
Admission: RE | Admit: 2014-10-14 | Discharge: 2014-10-14 | Disposition: A | Discharge status: Home / Self Care | Payer: BLUE CROSS/BLUE SHIELD | Source: Ambulatory Visit | Attending: Family Medicine | Admitting: Family Medicine

## 2014-10-14 VITALS — BP 110/66 | HR 80 | Temp 98.5°F | Resp 14 | Ht 73.0 in | Wt 227.0 lb

## 2014-10-14 DIAGNOSIS — R0789 Other chest pain: Secondary | ICD-10-CM

## 2014-10-14 DIAGNOSIS — M549 Dorsalgia, unspecified: Secondary | ICD-10-CM

## 2014-10-14 NOTE — Progress Notes (Signed)
Subjective:    [name], male    DOB: 13-Mar-1961, 54 y.o.   MRN: [id number]  HPI  Patient has had chest pain for 3 weeks. Patient denies any specific cause. The pain just gradually began. The pain is located underneath his lower right sternum. At times it radiates into the center of his back around the level of T7. He denies any falls or injuries. He denies any shortness of breath. He denies any dyspnea on exertion. He denies any pleurisy. He denies any cough. He denies any hemoptysis. He denies any angina. He denies any nausea or vomiting. He denies any relationship of the pain to food. He denies any melanoma or hematochezia. Today on examination the patient is pain-free with palpation of the right upper quadrant. There is no hepatosplenomegaly. There is no tenderness to palpation over his pancreas. I'm unable to reproduce the pain by palpation of the sternum. There is no evidence of costochondritis. There is no pain with palpation of his ribs or his thoracic spinous processes. Cardiac exam is unremarkable. Pulmonary exam is unremarkable. Patient states the pain is not severe but it is nagging and persistent. There are no exacerbating or alleviating factors. Past Medical History  Diagnosis Date  . Hyperlipidemia   . Low back pain   . Hx of adenomatous polyp of colon 07/08/2014   Past Surgical History  Procedure Laterality Date  . Hernia repair    . Colonoscopy  09-17-2003    normal dr Wynetta Emery   Current Outpatient Prescriptions on File Prior to Visit  Medication Sig Dispense Refill  . aspirin 81 MG tablet Take 81 mg by mouth daily.    Marland Kitchen atorvastatin (LIPITOR) 20 MG tablet Take 1 tablet (20 mg total) by mouth daily. 90 tablet 3  . Multiple Vitamin (MULTIVITAMIN) tablet Take 1 tablet by mouth daily.    Marland Kitchen OVER THE COUNTER MEDICATION Take 1 capsule by mouth daily. Turmeric For joints     No current facility-administered medications on file prior to visit.   Allergies    Allergen Reactions  . Niaspan [Niacin Er]     Severe flushing   History   Social History  . Marital Status: Married    Spouse Name: N/A  . Number of Children: N/A  . Years of Education: N/A   Occupational History  . Not on file.   Social History Main Topics  . Smoking status: Never Smoker   . Smokeless tobacco: Never Used  . Alcohol Use: No  . Drug Use: No  . Sexual Activity: Yes     Comment: married.  Works in Therapist, occupational, Psychologist, educational   Other Topics Concern  . Not on file   Social History Narrative     Review of Systems  All other systems reviewed and are negative.      Objective:   Physical Exam  Constitutional: He appears well-developed and well-nourished. No distress.  HENT:  Mouth/Throat: Oropharynx is clear and moist. No oropharyngeal exudate.  Eyes: Conjunctivae are normal.  Neck: Neck supple. No JVD present.  Cardiovascular: Normal rate, regular rhythm and normal heart sounds.  Exam reveals no gallop and no friction rub.   No murmur heard. Pulmonary/Chest: Effort normal and breath sounds normal. No respiratory distress. He has no wheezes. He has no rales.  Abdominal: Soft. Bowel sounds are normal. He exhibits no distension and no mass. There is no tenderness. There is no rebound and no guarding.  Musculoskeletal:  Thoracic back: He exhibits normal range of motion, no tenderness, no bony tenderness, no pain and no spasm.  Lymphadenopathy:    He has no cervical adenopathy.  Skin: He is not diaphoretic.  Vitals reviewed.         Assessment & Plan:  Other chest pain - Plan: DG Chest 2 View, DG Thoracic Spine W/Swimmers  Mid-back pain, acute - Plan: DG Chest 2 View, DG Thoracic Spine W/Swimmers  Patient's pain is nonspecific. Differential diagnosis includes cardiac sources, aortic sources, esophageal sources of chest pain, pulmonary sources, and musculoskeletal sources. I will begin the workup with an EKG. The patient's pain is very atypical  for cardiac sources. If his EKG is norma, then I do not feel this is cardiac in nature. I will proceed with a chest x-ray to rule out pulmonary sources and also to evaluate for possible signs of an aortic dissection which I feel is highly unlikely. I will also obtain x-rays of thoracic spine to rule out bony lesions and injuries in the thoracic spine which may account for his pain. Patient's symptoms do not sound to be gastrointestinal in origin. He has very few if any risk factors for pancreatitis and the pain is actually much higher than his pancreas. Pain does not sound be gallstone related. Seems to be unrelated to food although GERD is a possibility. Next up would be a CT scan of the chest with EKG, chest x-ray, and T-spine x-rays are normal.  EKG reveals normal sinus rhythm with normal intervals and a normal axis. There is no ST segment changes consistent with ischemia. There are no Q waves consistent with infarction. There are no abnormalities seen on the EKG that would account for the patient's symptoms. I will next proceed with a chest x-ray and T-spine x-ray. Consider a CT scan of the chest if x-rays are normal.

## 2014-10-15 ENCOUNTER — Telehealth: Payer: Self-pay | Admitting: Family Medicine

## 2014-10-15 NOTE — Telephone Encounter (Signed)
Tried to call pt no answer and no vm.

## 2014-10-15 NOTE — Telephone Encounter (Signed)
Patient calling to see if xray results were in  [phone]

## 2014-10-20 NOTE — Telephone Encounter (Signed)
Notes Recorded by Eden Lathe Six, LPN on 4/91/7915 at 0:56 PM Patient returned call and made aware.

## 2014-12-20 ENCOUNTER — Other Ambulatory Visit: Payer: Self-pay | Admitting: Family Medicine

## 2015-02-15 ENCOUNTER — Ambulatory Visit (INDEPENDENT_AMBULATORY_CARE_PROVIDER_SITE_OTHER): Payer: BLUE CROSS/BLUE SHIELD | Admitting: Family Medicine

## 2015-02-15 DIAGNOSIS — Z23 Encounter for immunization: Secondary | ICD-10-CM

## 2015-03-01 ENCOUNTER — Encounter: Payer: Self-pay | Admitting: Family Medicine

## 2015-03-03 MED ORDER — METAXALONE 800 MG PO TABS: 800.0000 mg | ORAL_TABLET | Freq: Three times a day (TID) | ORAL | Status: DC | PRN

## 2015-04-19 ENCOUNTER — Other Ambulatory Visit: Payer: BLUE CROSS/BLUE SHIELD

## 2015-04-19 DIAGNOSIS — Z125 Encounter for screening for malignant neoplasm of prostate: Secondary | ICD-10-CM

## 2015-04-19 DIAGNOSIS — R7303 Prediabetes: Secondary | ICD-10-CM

## 2015-04-19 DIAGNOSIS — Z0000 Encounter for general adult medical examination without abnormal findings: Secondary | ICD-10-CM

## 2015-04-19 DIAGNOSIS — Z79899 Other long term (current) drug therapy: Secondary | ICD-10-CM

## 2015-04-19 DIAGNOSIS — E785 Hyperlipidemia, unspecified: Secondary | ICD-10-CM

## 2015-04-19 LAB — COMPLETE METABOLIC PANEL WITH GFR
ALT: 21 U/L (ref 9–46)
AST: 20 U/L (ref 10–35)
Albumin: 4.1 g/dL (ref 3.6–5.1)
Alkaline Phosphatase: 49 U/L (ref 40–115)
BUN: 19 mg/dL (ref 7–25)
CHLORIDE: 105 mmol/L (ref 98–110)
CO2: 25 mmol/L (ref 20–31)
CREATININE: 0.71 mg/dL (ref 0.70–1.33)
Calcium: 8.9 mg/dL (ref 8.6–10.3)
GFR, Est African American: >89 mL/min (ref >=60)
GFR, Est Non African American: >89 mL/min (ref >=60)
GLUCOSE: 90 mg/dL (ref 70–99)
Potassium: 4.5 mmol/L (ref 3.5–5.3)
SODIUM: 139 mmol/L (ref 135–146)
Total Bilirubin: 0.9 mg/dL (ref 0.2–1.2)
Total Protein: 6 g/dL — ABNORMAL LOW (ref 6.1–8.1)

## 2015-04-19 LAB — CBC WITH DIFFERENTIAL/PLATELET
BASOS ABS: 0 10*3/uL (ref 0.0–0.1)
Basophils Relative: 0 % (ref 0–1)
EOS ABS: 0.3 10*3/uL (ref 0.0–0.7)
EOS PCT: 5 % (ref 0–5)
HCT: 41.7 % (ref 39.0–52.0)
Hemoglobin: 14.3 g/dL (ref 13.0–17.0)
LYMPHS ABS: 1.4 10*3/uL (ref 0.7–4.0)
Lymphocytes Relative: 26 % (ref 12–46)
MCH: 30.2 pg (ref 26.0–34.0)
MCHC: 34.3 g/dL (ref 30.0–36.0)
MCV: 88.2 fL (ref 78.0–100.0)
MPV: 9.7 fL (ref 8.6–12.4)
Monocytes Absolute: 0.4 10*3/uL (ref 0.1–1.0)
Monocytes Relative: 8 % (ref 3–12)
Neutro Abs: 3.3 10*3/uL (ref 1.7–7.7)
Neutrophils Relative %: 61 % (ref 43–77)
PLATELETS: 188 10*3/uL (ref 150–400)
RBC: 4.73 MIL/uL (ref 4.22–5.81)
RDW: 14.2 % (ref 11.5–15.5)
WBC: 5.4 10*3/uL (ref 4.0–10.5)

## 2015-04-19 LAB — LIPID PANEL
CHOL/HDL RATIO: 3.6 ratio (ref <=5.0)
CHOLESTEROL: 167 mg/dL (ref 125–200)
HDL: 47 mg/dL (ref >=40)
LDL Cholesterol: 108 mg/dL (ref <130)
Triglycerides: 60 mg/dL (ref <150)
VLDL: 12 mg/dL (ref <30)

## 2015-04-19 LAB — HEMOGLOBIN A1C
Hgb A1c MFr Bld: 5.9 % — ABNORMAL HIGH (ref <5.7)
Mean Plasma Glucose: 123 mg/dL — ABNORMAL HIGH (ref <117)

## 2015-04-19 LAB — TSH: TSH: 1.13 u[IU]/mL (ref 0.350–4.500)

## 2015-04-20 LAB — PSA: PSA: 1.42 ng/mL (ref <=4.00)

## 2015-04-21 ENCOUNTER — Ambulatory Visit (INDEPENDENT_AMBULATORY_CARE_PROVIDER_SITE_OTHER): Payer: BLUE CROSS/BLUE SHIELD | Admitting: Family Medicine

## 2015-04-21 ENCOUNTER — Encounter: Payer: Self-pay | Admitting: Family Medicine

## 2015-04-21 VITALS — BP 100/70 | HR 68 | Temp 98.2°F | Resp 16 | Ht 73.0 in | Wt 230.0 lb

## 2015-04-21 DIAGNOSIS — Z0000 Encounter for general adult medical examination without abnormal findings: Secondary | ICD-10-CM

## 2015-04-21 NOTE — Progress Notes (Signed)
Subjective:    [name], male    DOB: 01/20/1961, 54 y.o.   MRN: [id number]  HPI  Patient is here today for complete physical exam. Patient just had a colonoscopy in February of this year. He did have a tubular adenoma. Repeat colonoscopy was recommended in 2021. He is due today for his prostate exam. His flu shot is up-to-date. Tetanus shot was administered 2 years ago. He does not meet any of the criteria for Pneumovax 23 earlier than age 46. He is not due for the shingles vaccine until age 47. His most recent lab work is listed below: Lab on 04/19/2015  Component Date Value Ref Range Status  . Sodium 04/19/2015 139  135 - 146 mmol/L Final  . Potassium 04/19/2015 4.5  3.5 - 5.3 mmol/L Final  . Chloride 04/19/2015 105  98 - 110 mmol/L Final  . CO2 04/19/2015 25  20 - 31 mmol/L Final  . Glucose, Bld 04/19/2015 90  70 - 99 mg/dL Final  . BUN 04/19/2015 19  7 - 25 mg/dL Final  . Creat 04/19/2015 0.71  0.70 - 1.33 mg/dL Final  . Total Bilirubin 04/19/2015 0.9  0.2 - 1.2 mg/dL Final  . Alkaline Phosphatase 04/19/2015 49  40 - 115 U/L Final  . AST 04/19/2015 20  10 - 35 U/L Final  . ALT 04/19/2015 21  9 - 46 U/L Final  . Total Protein 04/19/2015 6.0* 6.1 - 8.1 g/dL Final  . Albumin 04/19/2015 4.1  3.6 - 5.1 g/dL Final  . Calcium 04/19/2015 8.9  8.6 - 10.3 mg/dL Final  . GFR, Est African American 04/19/2015 >89  >=60 mL/min Final  . GFR, Est Non African American 04/19/2015 >89  >=60 mL/min Final   Comment:   The estimated GFR is a calculation valid for adults (>=12 years old) that uses the CKD-EPI algorithm to adjust for age and sex. It is   not to be used for children, pregnant women, hospitalized patients,    patients on dialysis, or with rapidly changing kidney function. According to the NKDEP, eGFR >89 is normal, 60-89 shows mild impairment, 30-59 shows moderate impairment, 15-29 shows severe impairment and <15 is ESRD.     Marland Kitchen TSH 04/19/2015 1.130  0.350 - 4.500  uIU/mL Final  . Cholesterol 04/19/2015 167  125 - 200 mg/dL Final  . Triglycerides 04/19/2015 60  <150 mg/dL Final  . HDL 04/19/2015 47  >=40 mg/dL Final  . Total CHOL/HDL Ratio 04/19/2015 3.6  <=5.0 Ratio Final  . VLDL 04/19/2015 12  <30 mg/dL Final  . LDL Cholesterol 04/19/2015 108  <130 mg/dL Final   Comment:   Total Cholesterol/HDL Ratio:CHD Risk                        Coronary Heart Disease Risk Table                                        Men       Women          1/2 Average Risk              3.4        3.3              Average Risk              5.0  4.4           2X Average Risk              9.6        7.1           3X Average Risk             23.4       11.0 Use the calculated Patient Ratio above and the CHD Risk table  to determine the patient's CHD Risk.   . WBC 04/19/2015 5.4  4.0 - 10.5 K/uL Final  . RBC 04/19/2015 4.73  4.22 - 5.81 MIL/uL Final  . Hemoglobin 04/19/2015 14.3  13.0 - 17.0 g/dL Final  . HCT 04/19/2015 41.7  39.0 - 52.0 % Final  . MCV 04/19/2015 88.2  78.0 - 100.0 fL Final  . MCH 04/19/2015 30.2  26.0 - 34.0 pg Final  . MCHC 04/19/2015 34.3  30.0 - 36.0 g/dL Final  . RDW 04/19/2015 14.2  11.5 - 15.5 % Final  . Platelets 04/19/2015 188  150 - 400 K/uL Final  . MPV 04/19/2015 9.7  8.6 - 12.4 fL Final  . Neutrophils Relative % 04/19/2015 61  43 - 77 % Final  . Neutro Abs 04/19/2015 3.3  1.7 - 7.7 K/uL Final  . Lymphocytes Relative 04/19/2015 26  12 - 46 % Final  . Lymphs Abs 04/19/2015 1.4  0.7 - 4.0 K/uL Final  . Monocytes Relative 04/19/2015 8  3 - 12 % Final  . Monocytes Absolute 04/19/2015 0.4  0.1 - 1.0 K/uL Final  . Eosinophils Relative 04/19/2015 5  0 - 5 % Final  . Eosinophils Absolute 04/19/2015 0.3  0.0 - 0.7 K/uL Final  . Basophils Relative 04/19/2015 0  0 - 1 % Final  . Basophils Absolute 04/19/2015 0.0  0.0 - 0.1 K/uL Final  . Smear Review 04/19/2015 Criteria for review not met   Final  . Hgb A1c MFr Bld 04/19/2015 5.9* <5.7 % Final    Comment:                                                                        According to the ADA Clinical Practice Recommendations for 2011, when HbA1c is used as a screening test:     >=6.5%   Diagnostic of Diabetes Mellitus            (if abnormal result is confirmed)   5.7-6.4%   Increased risk of developing Diabetes Mellitus   References:Diagnosis and Classification of Diabetes Mellitus,Diabetes TMHD,6222,97(LGXQJ 1):S62-S69 and Standards of Medical Care in         Diabetes - 2011,Diabetes JHER,7408,14 (Suppl 1):S11-S61.     . Mean Plasma Glucose 04/19/2015 123* <117 mg/dL Final  . PSA 04/19/2015 1.42  <=4.00 ng/mL Final   Comment: Test Methodology: ECLIA PSA (Electrochemiluminescence Immunoassay)   For PSA values from 2.5-4.0, particularly in younger men <26 years old, the AUA and NCCN suggest testing for % Free PSA (3515) and evaluation of the rate of increase in PSA (PSA velocity).    Past Medical History  Diagnosis Date  . Hyperlipidemia   . Low back pain   . Hx of adenomatous polyp of colon 07/08/2014  Past Surgical History  Procedure Laterality Date  . Hernia repair    . Colonoscopy  09-17-2003    normal dr Johnson   Current Outpatient Prescriptions on File Prior to Visit  Medication Sig Dispense Refill  . aspirin 81 MG tablet Take 81 mg by mouth daily.    . atorvastatin (LIPITOR) 20 MG tablet TAKE 1 TABLET BY MOUTH DAILY 90 tablet 11  . Multiple Vitamin (MULTIVITAMIN) tablet Take 1 tablet by mouth daily.    . Omega-3 Fatty Acids (FISH OIL CONCENTRATE PO) Take by mouth.    . OVER THE COUNTER MEDICATION Take 1 capsule by mouth daily. Turmeric For joints     No current facility-administered medications on file prior to visit.   Allergies  Allergen Reactions  . Niaspan [Niacin Er]     Severe flushing   Social History   Social History  . Marital Status: Married    Spouse Name: N/A  . Number of Children: N/A  . Years of Education: N/A   Occupational  History  . Not on file.   Social History Main Topics  . Smoking status: Never Smoker   . Smokeless tobacco: Never Used  . Alcohol Use: No  . Drug Use: No  . Sexual Activity: Yes     Comment: married.  Works in fabrication, manufacturing   Other Topics Concern  . Not on file   Social History Narrative   Family History  Problem Relation Age of Onset  . Alcohol abuse Brother   . Hyperlipidemia Brother   . Diabetes Father   . Colon cancer Maternal Grandmother   . Esophageal cancer Neg Hx   . Rectal cancer Neg Hx   . Stomach cancer Neg Hx       Review of Systems  All other systems reviewed and are negative.      Objective:   Physical Exam  Constitutional: He is oriented to person, place, and time. He appears well-developed and well-nourished. No distress.  HENT:  Head: Normocephalic and atraumatic.  Right Ear: External ear normal.  Left Ear: External ear normal.  Nose: Nose normal.  Mouth/Throat: Oropharynx is clear and moist. No oropharyngeal exudate.  Eyes: Conjunctivae and EOM are normal. Pupils are equal, round, and reactive to light. Right eye exhibits no discharge. Left eye exhibits no discharge. No scleral icterus.  Neck: Normal range of motion. Neck supple. No JVD present. No tracheal deviation present. No thyromegaly present.  Cardiovascular: Normal rate, regular rhythm, normal heart sounds and intact distal pulses.  Exam reveals no gallop and no friction rub.   No murmur heard. Pulmonary/Chest: Effort normal and breath sounds normal. No stridor. No respiratory distress. He has no wheezes. He has no rales. He exhibits no tenderness.  Abdominal: Soft. Bowel sounds are normal. He exhibits no distension and no mass. There is no tenderness. There is no rebound and no guarding.  Genitourinary: Rectum normal, prostate normal and penis normal.  Musculoskeletal: Normal range of motion. He exhibits no edema or tenderness.  Lymphadenopathy:    He has no cervical  adenopathy.  Neurological: He is alert and oriented to person, place, and time. He has normal reflexes. No cranial nerve deficit. He exhibits normal muscle tone. Coordination normal.  Skin: Skin is warm. No rash noted. He is not diaphoretic. No erythema. No pallor.  Psychiatric: He has a normal mood and affect. His behavior is normal. Judgment and thought content normal.  Vitals reviewed.         Assessment &   Plan:  Routine general medical examination at a health care facility  Patient's physical exam is up-to-date. His exam is normal. His lab work is significant only for mildly elevated sugar. We spent 5 minutes discussing a low carbohydrate diet, exercise, and weight loss. His immunizations are up-to-date. The remainder of his cancer screening is up-to-date including his prostate exam, his PSA, and his colonoscopy. Recheck in one year or as needed. Continue his current medications at the present dosages. Right now Lipitor 10 mg a day seems adequate to manage his cholesterol.

## 2015-05-07 ENCOUNTER — Encounter: Payer: Self-pay | Admitting: Family Medicine

## 2015-05-20 ENCOUNTER — Encounter: Payer: Self-pay | Admitting: Family Medicine

## 2015-06-23 ENCOUNTER — Encounter: Payer: Self-pay | Admitting: Family Medicine

## 2015-06-24 ENCOUNTER — Other Ambulatory Visit: Payer: Self-pay | Admitting: Family Medicine

## 2015-06-24 MED ORDER — ATORVASTATIN CALCIUM 10 MG PO TABS: 10.0000 mg | ORAL_TABLET | Freq: Every day | ORAL | Status: DC

## 2015-06-24 MED FILL — ATORVASTATIN 10 MG TABLET: 10 | 90 days supply | Qty: 90 | Fill #0

## 2015-06-27 ENCOUNTER — Other Ambulatory Visit: Payer: Self-pay | Admitting: Family Medicine

## 2015-06-27 DIAGNOSIS — Z79899 Other long term (current) drug therapy: Secondary | ICD-10-CM

## 2015-06-27 DIAGNOSIS — E785 Hyperlipidemia, unspecified: Secondary | ICD-10-CM

## 2015-08-16 ENCOUNTER — Ambulatory Visit (INDEPENDENT_AMBULATORY_CARE_PROVIDER_SITE_OTHER): Payer: 59 | Admitting: Family Medicine

## 2015-08-16 ENCOUNTER — Encounter: Payer: Self-pay | Admitting: Family Medicine

## 2015-08-16 VITALS — BP 130/70 | HR 72 | Temp 98.7°F | Resp 12 | Ht 73.0 in | Wt 230.0 lb

## 2015-08-16 DIAGNOSIS — H6123 Impacted cerumen, bilateral: Secondary | ICD-10-CM | POA: Diagnosis not present

## 2015-08-16 MED ORDER — CARBAMIDE PEROXIDE 6.5 % OT SOLN: 5.0000 [drp] | Freq: Two times a day (BID) | OTIC | Status: DC

## 2015-08-16 NOTE — Patient Instructions (Signed)
F/U as needed

## 2015-08-16 NOTE — Progress Notes (Signed)
[name], male   DOB: 24-Aug-1960, 55 y.o.   MRN: [id number]   Subjective:    [name], male    DOB: 1961-03-29, 55 y.o.   MRN: [id number]  Patient presents for L Ear Pain Patient here with left ear pain. He felt like he may have gotten some water in it but also states that he had wax buildup he denies any drainage out of the ear. He denies any sinus pressure drainage has not been ill. He is not using anything in his ears.    Review Of Systems:  GEN- denies fatigue, fever, weight loss,weakness, recent illness HEENT- denies eye drainage, change in vision, nasal discharge, CVS- denies chest pain, palpitations RESP- denies SOB, cough, wheezey Neuro- denies headache, dizziness, syncope, seizure activity       Objective:    BP 130/70 mmHg  Pulse 72  Temp(Src) 98.7 F (37.1 C) (Oral)  Resp 12  Ht 6\' 1"  (1.854 m)  Wt 230 lb (104.327 kg)  BMI 30.35 kg/m2 GEN- NAD, alert and oriented x3 HEENT- PERRL, EOMI, non injected sclera, pink conjunctiva, MMM, oropharynx clear, bilat Canals wax impaction, s/p irrigation- Left canal clear, TM in tact no effusion, Right hard wax toward back unable to visuazlize TM         Assessment & Plan:      Problem List Items Addressed This Visit    None    Visit Diagnoses    Cerumen impaction, bilateral    -  Primary    Unable to completely disimpact the right canal we will give him Debrox drops to use for a few days, he can return to have rinsed again       Note: This dictation was prepared with Dragon dictation along with smaller phrase technology. Any transcriptional errors that result from this process are unintentional.

## 2015-09-15 MED FILL — ATORVASTATIN 10 MG TABLET: 10 | 90 days supply | Qty: 90 | Fill #1

## 2015-09-23 ENCOUNTER — Encounter: Payer: Self-pay | Admitting: Family Medicine

## 2015-09-23 ENCOUNTER — Ambulatory Visit (INDEPENDENT_AMBULATORY_CARE_PROVIDER_SITE_OTHER): Payer: 59 | Admitting: Family Medicine

## 2015-09-23 VITALS — BP 100/64 | HR 82 | Temp 98.4°F | Resp 16 | Ht 72.0 in | Wt 229.0 lb

## 2015-09-23 DIAGNOSIS — J069 Acute upper respiratory infection, unspecified: Secondary | ICD-10-CM | POA: Diagnosis not present

## 2015-09-23 MED ORDER — AZITHROMYCIN 250 MG PO TABS: ORAL_TABLET | ORAL | Status: DC

## 2015-09-23 MED ORDER — HYDROCODONE-HOMATROPINE 5-1.5 MG/5ML PO SYRP: 5.0000 mL | ORAL_SOLUTION | Freq: Three times a day (TID) | ORAL | Status: DC | PRN

## 2015-09-23 MED FILL — HYDROMET SYRUP: 5-1.5 | 8 days supply | Qty: 120 | Fill #0

## 2015-09-23 MED FILL — AZITHROMYCIN 250 MG TABLET: 250 | 5 days supply | Qty: 6 | Fill #0

## 2015-09-23 NOTE — Progress Notes (Signed)
   Subjective:    [name], male    DOB: [birth year], 55 y.o.   MRN: [id number]  HPI  Patient ports a 5 day history of a sore throat that progressed into rhinorrhea and finally now an unrelenting nonproductive cough. He denies any shortness of breath. He denies any chest pain. He denies any fevers or chills. He denies any sinus pain. Past Medical History  Diagnosis Date  . Hyperlipidemia   . Low back pain   . Hx of adenomatous polyp of colon 07/08/2014   Past Surgical History  Procedure Laterality Date  . Hernia repair    . Colonoscopy  09-17-2003    normal dr Wynetta Emery   Current Outpatient Prescriptions on File Prior to Visit  Medication Sig Dispense Refill  . aspirin 81 MG tablet Take 81 mg by mouth daily.    Marland Kitchen atorvastatin (LIPITOR) 10 MG tablet Take 1 tablet (10 mg total) by mouth daily. 90 tablet 3  . glucosamine-chondroitin 500-400 MG tablet Take 1 tablet by mouth 3 (three) times daily.    . Multiple Vitamin (MULTIVITAMIN) tablet Take 1 tablet by mouth daily.    . Omega-3 Fatty Acids (FISH OIL CONCENTRATE PO) Take by mouth.    Marland Kitchen OVER THE COUNTER MEDICATION Take 1 capsule by mouth daily. Turmeric For joints     No current facility-administered medications on file prior to visit.   Allergies  Allergen Reactions  . Niaspan [Niacin Er]     Severe flushing   Social History   Social History  . Marital Status: Married    Spouse Name: N/A  . Number of Children: N/A  . Years of Education: N/A   Occupational History  . Not on file.   Social History Main Topics  . Smoking status: Never Smoker   . Smokeless tobacco: Never Used  . Alcohol Use: No  . Drug Use: No  . Sexual Activity: Yes     Comment: married.  Works in Therapist, occupational, Psychologist, educational   Other Topics Concern  . Not on file   Social History Narrative     Review of Systems  All other systems reviewed and are negative.      Objective:   Physical Exam  HENT:  Right Ear: External ear  normal.  Left Ear: External ear normal.  Nose: Nose normal.  Mouth/Throat: Oropharynx is clear and moist. No oropharyngeal exudate.  Eyes: Conjunctivae are normal.  Neck: Neck supple.  Cardiovascular: Normal rate, regular rhythm and normal heart sounds.   No murmur heard. Pulmonary/Chest: Effort normal and breath sounds normal. No respiratory distress. He has no wheezes. He has no rales.  Abdominal: Soft. Bowel sounds are normal. He exhibits no distension. There is no tenderness. There is no rebound.  Lymphadenopathy:    He has no cervical adenopathy.  Vitals reviewed.         Assessment & Plan:  Acute URI - Plan: azithromycin (ZITHROMAX) 250 MG tablet, HYDROcodone-homatropine (HYCODAN) 5-1.5 MG/5ML syrup  Symptoms are consistent with a viral upper respiratory infection. I recommended tincture of time. He can use Hycodan 1 teaspoon every 8 hours as needed for cough. Should his symptoms worsen, I did give him a prescription for a Z-Pak but he will not get the antibiotic unless he develops fever and/or purulent sputum.

## 2015-11-02 ENCOUNTER — Other Ambulatory Visit: Payer: 59

## 2015-11-02 DIAGNOSIS — E785 Hyperlipidemia, unspecified: Secondary | ICD-10-CM | POA: Diagnosis not present

## 2015-11-02 DIAGNOSIS — Z79899 Other long term (current) drug therapy: Secondary | ICD-10-CM | POA: Diagnosis not present

## 2015-11-03 LAB — COMPLETE METABOLIC PANEL WITH GFR
ALT: 16 U/L (ref 9–46)
AST: 18 U/L (ref 10–35)
Albumin: 4.3 g/dL (ref 3.6–5.1)
Alkaline Phosphatase: 50 U/L (ref 40–115)
BILIRUBIN TOTAL: 1 mg/dL (ref 0.2–1.2)
BUN: 23 mg/dL (ref 7–25)
CO2: 18 mmol/L — AB (ref 20–31)
Calcium: 9 mg/dL (ref 8.6–10.3)
Chloride: 110 mmol/L (ref 98–110)
Creat: 0.71 mg/dL (ref 0.70–1.33)
GLUCOSE: 107 mg/dL — AB (ref 70–99)
Potassium: 4.3 mmol/L (ref 3.5–5.3)
SODIUM: 141 mmol/L (ref 135–146)
TOTAL PROTEIN: 6.3 g/dL (ref 6.1–8.1)

## 2015-11-03 LAB — LIPID PANEL
CHOL/HDL RATIO: 3.8 ratio (ref <=5.0)
Cholesterol: 177 mg/dL (ref 125–200)
HDL: 47 mg/dL (ref >=40)
LDL CALC: 115 mg/dL (ref <130)
TRIGLYCERIDES: 77 mg/dL (ref <150)
VLDL: 15 mg/dL (ref <30)

## 2015-11-07 ENCOUNTER — Encounter: Payer: Self-pay | Admitting: Family Medicine

## 2015-11-09 ENCOUNTER — Encounter: Payer: Self-pay | Admitting: Family Medicine

## 2015-11-11 ENCOUNTER — Encounter: Payer: Self-pay | Admitting: Family Medicine

## 2015-12-19 ENCOUNTER — Other Ambulatory Visit: Payer: Self-pay | Admitting: Family Medicine

## 2015-12-19 ENCOUNTER — Encounter: Payer: Self-pay | Admitting: Family Medicine

## 2015-12-19 MED ORDER — SIMVASTATIN 10 MG PO TABS: 10.0000 mg | ORAL_TABLET | Freq: Every day | ORAL | 3 refills | Status: DC

## 2015-12-22 ENCOUNTER — Other Ambulatory Visit: Payer: Self-pay | Admitting: Family Medicine

## 2015-12-22 MED ORDER — ATORVASTATIN CALCIUM 10 MG PO TABS
10.0000 mg | ORAL_TABLET | Freq: Every day | ORAL | 3 refills | Status: DC
Start: 2015-12-22 — End: 2016-05-28

## 2015-12-22 MED FILL — ATORVASTATIN 10 MG TABLET: 10 | 90 days supply | Qty: 90 | Fill #0

## 2016-02-13 ENCOUNTER — Ambulatory Visit (INDEPENDENT_AMBULATORY_CARE_PROVIDER_SITE_OTHER): Payer: 59 | Admitting: Family Medicine

## 2016-02-13 DIAGNOSIS — Z23 Encounter for immunization: Secondary | ICD-10-CM | POA: Diagnosis not present

## 2016-03-26 MED FILL — ATORVASTATIN 10 MG TABLET: 10 | 90 days supply | Qty: 90 | Fill #1

## 2016-03-29 DIAGNOSIS — H35711 Central serous chorioretinopathy, right eye: Secondary | ICD-10-CM | POA: Diagnosis not present

## 2016-04-16 ENCOUNTER — Ambulatory Visit (INDEPENDENT_AMBULATORY_CARE_PROVIDER_SITE_OTHER): Payer: 59 | Admitting: Family Medicine

## 2016-04-16 ENCOUNTER — Encounter: Payer: Self-pay | Admitting: Family Medicine

## 2016-04-16 VITALS — BP 110/74 | HR 82 | Temp 99.2°F | Resp 16 | Ht 73.0 in | Wt 232.0 lb

## 2016-04-16 DIAGNOSIS — J019 Acute sinusitis, unspecified: Secondary | ICD-10-CM | POA: Diagnosis not present

## 2016-04-16 DIAGNOSIS — J029 Acute pharyngitis, unspecified: Secondary | ICD-10-CM

## 2016-04-16 DIAGNOSIS — B9689 Other specified bacterial agents as the cause of diseases classified elsewhere: Secondary | ICD-10-CM

## 2016-04-16 LAB — STREP GROUP A AG, W/REFLEX TO CULT: STREGTOCOCCUS GROUP A AG SCREEN: NOT DETECTED

## 2016-04-16 MED ORDER — AMOXICILLIN 875 MG PO TABS: 875.0000 mg | ORAL_TABLET | Freq: Two times a day (BID) | ORAL | 0 refills | Status: DC

## 2016-04-16 MED ORDER — HYDROCODONE-HOMATROPINE 5-1.5 MG/5ML PO SYRP: 5.0000 mL | ORAL_SOLUTION | Freq: Three times a day (TID) | ORAL | 0 refills | Status: DC | PRN

## 2016-04-16 MED FILL — HYDROCODONE-HOMATROPINE SYR: 5-1.5 | 8 days supply | Qty: 120 | Fill #0

## 2016-04-16 MED FILL — AMOXICILLIN 875 MG TABLET: 875 | 10 days supply | Qty: 20 | Fill #0

## 2016-04-16 NOTE — Progress Notes (Signed)
   Subjective:    [name], male    DOB: [birth year], 55 y.o.   MRN: [id number]  HPI  Patient Reports a 5 day history of sinus pressure and sinus pain behind both eyes and in both maxillary sinuses. He reports subjective fevers. He reports significant postnasal drainage causing a nonproductive cough and a very sore throat. He reports sinus headaches and dizziness. Strep test is negative Past Medical History:  Diagnosis Date  . Hx of adenomatous polyp of colon 07/08/2014  . Hyperlipidemia   . Low back pain    Past Surgical History:  Procedure Laterality Date  . COLONOSCOPY  09-17-2003   normal dr Wynetta Emery  . HERNIA REPAIR     Current Outpatient Prescriptions on File Prior to Visit  Medication Sig Dispense Refill  . aspirin 81 MG tablet Take 81 mg by mouth daily.    Marland Kitchen atorvastatin (LIPITOR) 10 MG tablet Take 1 tablet (10 mg total) by mouth daily. Please cancel simvastatin prescription 90 tablet 3  . glucosamine-chondroitin 500-400 MG tablet Take 1 tablet by mouth 3 (three) times daily.    . Multiple Vitamin (MULTIVITAMIN) tablet Take 1 tablet by mouth daily.    . Omega-3 Fatty Acids (FISH OIL CONCENTRATE PO) Take by mouth.    Marland Kitchen OVER THE COUNTER MEDICATION Take 1 capsule by mouth daily. Turmeric For joints    . simvastatin (ZOCOR) 10 MG tablet Take 1 tablet (10 mg total) by mouth at bedtime. 90 tablet 3   No current facility-administered medications on file prior to visit.    Allergies  Allergen Reactions  . Niaspan [Niacin Er]     Severe flushing   Social History   Social History  . Marital status: Married    Spouse name: N/A  . Number of children: N/A  . Years of education: N/A   Occupational History  . Not on file.   Social History Main Topics  . Smoking status: Never Smoker  . Smokeless tobacco: Never Used  . Alcohol use No  . Drug use: No  . Sexual activity: Yes     Comment: married.  Works in Therapist, occupational, Psychologist, educational   Other Topics Concern  .  Not on file   Social History Narrative  . No narrative on file     Review of Systems  All other systems reviewed and are negative.      Objective:   Physical Exam  HENT:  Right Ear: External ear normal.  Left Ear: External ear normal.  Nose: Nose normal.  Mouth/Throat: Oropharynx is clear and moist. No oropharyngeal exudate.  Eyes: Conjunctivae are normal.  Neck: Neck supple.  Cardiovascular: Normal rate, regular rhythm and normal heart sounds.   No murmur heard. Pulmonary/Chest: Effort normal and breath sounds normal. No respiratory distress. He has no wheezes. He has no rales.  Abdominal: Soft. Bowel sounds are normal. He exhibits no distension. There is no tenderness. There is no rebound.  Lymphadenopathy:    He has no cervical adenopathy.  Vitals reviewed.         Assessment & Plan:  Sore throat - Plan: STREP GROUP A AG, W/REFLEX TO CULT, amoxicillin (AMOXIL) 875 MG tablet  Acute bacterial rhinosinusitis - Plan: amoxicillin (AMOXIL) 875 MG tablet Patient has a sinus infection. Symptoms are worsening now 5 days then. Begin amoxicillin 875 mg by mouth twice a day for 10 days. Use Hycodan for cough. Continue to use Mucinex DM and Sudafed for congestion and cough.

## 2016-04-18 LAB — CULTURE, GROUP A STREP: ORGANISM ID, BACTERIA: NORMAL

## 2016-05-23 ENCOUNTER — Other Ambulatory Visit: Payer: 59

## 2016-05-23 DIAGNOSIS — Z125 Encounter for screening for malignant neoplasm of prostate: Secondary | ICD-10-CM

## 2016-05-23 DIAGNOSIS — R7303 Prediabetes: Secondary | ICD-10-CM

## 2016-05-23 DIAGNOSIS — Z0000 Encounter for general adult medical examination without abnormal findings: Secondary | ICD-10-CM | POA: Diagnosis not present

## 2016-05-23 DIAGNOSIS — Z79899 Other long term (current) drug therapy: Secondary | ICD-10-CM

## 2016-05-23 LAB — COMPLETE METABOLIC PANEL WITH GFR
ALT: 18 U/L (ref 9–46)
AST: 19 U/L (ref 10–35)
Albumin: 4.3 g/dL (ref 3.6–5.1)
Alkaline Phosphatase: 58 U/L (ref 40–115)
BUN: 17 mg/dL (ref 7–25)
CHLORIDE: 106 mmol/L (ref 98–110)
CO2: 24 mmol/L (ref 20–31)
Calcium: 8.9 mg/dL (ref 8.6–10.3)
Creat: 0.71 mg/dL (ref 0.70–1.33)
GFR, Est African American: >89 mL/min (ref >=60)
GFR, Est Non African American: >89 mL/min (ref >=60)
GLUCOSE: 91 mg/dL (ref 70–99)
POTASSIUM: 4.1 mmol/L (ref 3.5–5.3)
SODIUM: 141 mmol/L (ref 135–146)
Total Bilirubin: 1.1 mg/dL (ref 0.2–1.2)
Total Protein: 6.5 g/dL (ref 6.1–8.1)

## 2016-05-23 LAB — CBC WITH DIFFERENTIAL/PLATELET
BASOS ABS: 56 {cells}/uL (ref 0–200)
Basophils Relative: 1 %
Eosinophils Absolute: 168 cells/uL (ref 15–500)
Eosinophils Relative: 3 %
HEMATOCRIT: 43 % (ref 38.5–50.0)
Hemoglobin: 14.3 g/dL (ref 13.0–17.0)
LYMPHS PCT: 27 %
Lymphs Abs: 1512 cells/uL (ref 850–3900)
MCH: 29.5 pg (ref 27.0–33.0)
MCHC: 33.3 g/dL (ref 32.0–36.0)
MCV: 88.8 fL (ref 80.0–100.0)
MONO ABS: 448 {cells}/uL (ref 200–950)
MPV: 10.1 fL (ref 7.5–12.5)
Monocytes Relative: 8 %
NEUTROS PCT: 61 %
Neutro Abs: 3416 cells/uL (ref 1500–7800)
Platelets: 206 10*3/uL (ref 140–400)
RBC: 4.84 MIL/uL (ref 4.20–5.80)
RDW: 14.5 % (ref 11.0–15.0)
WBC: 5.6 10*3/uL (ref 3.8–10.8)

## 2016-05-23 LAB — LIPID PANEL
CHOL/HDL RATIO: 4.4 ratio (ref <5.0)
Cholesterol: 197 mg/dL (ref <200)
HDL: 45 mg/dL (ref >40)
LDL CALC: 134 mg/dL — AB (ref <100)
TRIGLYCERIDES: 91 mg/dL (ref <150)
VLDL: 18 mg/dL (ref <30)

## 2016-05-23 LAB — HEMOGLOBIN A1C
Hgb A1c MFr Bld: 5.5 % (ref <5.7)
MEAN PLASMA GLUCOSE: 111 mg/dL

## 2016-05-23 LAB — TSH: TSH: 1.15 m[IU]/L (ref 0.40–4.50)

## 2016-05-23 LAB — PSA: PSA: 2.2 ng/mL (ref <=4.0)

## 2016-05-28 ENCOUNTER — Ambulatory Visit (INDEPENDENT_AMBULATORY_CARE_PROVIDER_SITE_OTHER): Payer: 59 | Admitting: Family Medicine

## 2016-05-28 ENCOUNTER — Encounter: Payer: Self-pay | Admitting: Family Medicine

## 2016-05-28 VITALS — BP 126/74 | HR 72 | Temp 97.8°F | Resp 18 | Ht 73.0 in | Wt 234.0 lb

## 2016-05-28 DIAGNOSIS — Z0000 Encounter for general adult medical examination without abnormal findings: Secondary | ICD-10-CM

## 2016-05-28 DIAGNOSIS — E7800 Pure hypercholesterolemia, unspecified: Secondary | ICD-10-CM | POA: Diagnosis not present

## 2016-05-28 MED ORDER — ATORVASTATIN CALCIUM 10 MG PO TABS
10.0000 mg | ORAL_TABLET | Freq: Every day | ORAL | 3 refills | Status: DC
Start: 2016-05-28 — End: 2017-05-30

## 2016-05-28 MED ORDER — MELOXICAM 15 MG PO TABS: 15.0000 mg | ORAL_TABLET | Freq: Every day | ORAL | 0 refills | Status: DC

## 2016-05-28 MED FILL — MELOXICAM 15 MG TABLET: 15 | 30 days supply | Qty: 30 | Fill #0

## 2016-05-28 MED FILL — ATORVASTATIN 10 MG TABLET: 10 | 90 days supply | Qty: 90 | Fill #0

## 2016-05-28 NOTE — Progress Notes (Signed)
Subjective:    [name], male    DOB: 1960/12/02, 56 y.o.   MRN: [id number]  HPI Patient is here today for complete physical exam. Patient just had a colonoscopy in February of 2016. He did have a tubular adenoma. Repeat colonoscopy was recommended in 2021.PSA did increase from 1.5-2.2 but is asymptomatic and his digital rectal exam today is normal.  His flu shot is up-to-date. Tetanus shot was administered 3 years ago. He does not meet any of the criteria for Pneumovax 23 earlier than age 75. He is not due for the shingles vaccine until age 4. His most recent lab work is listed below: Lab on 05/23/2016  Component Date Value Ref Range Status  . Sodium 05/23/2016 141  135 - 146 mmol/L Final  . Potassium 05/23/2016 4.1  3.5 - 5.3 mmol/L Final  . Chloride 05/23/2016 106  98 - 110 mmol/L Final  . CO2 05/23/2016 24  20 - 31 mmol/L Final  . Glucose, Bld 05/23/2016 91  70 - 99 mg/dL Final  . BUN 05/23/2016 17  7 - 25 mg/dL Final  . Creat 05/23/2016 0.71  0.70 - 1.33 mg/dL Final   Comment:   For patients > or = 55 years of age: The upper reference limit for Creatinine is approximately 13% higher for people identified as African-American.     . Total Bilirubin 05/23/2016 1.1  0.2 - 1.2 mg/dL Final  . Alkaline Phosphatase 05/23/2016 58  40 - 115 U/L Final  . AST 05/23/2016 19  10 - 35 U/L Final  . ALT 05/23/2016 18  9 - 46 U/L Final  . Total Protein 05/23/2016 6.5  6.1 - 8.1 g/dL Final  . Albumin 05/23/2016 4.3  3.6 - 5.1 g/dL Final  . Calcium 05/23/2016 8.9  8.6 - 10.3 mg/dL Final  . GFR, Est African American 05/23/2016 >89  >=60 mL/min Final  . GFR, Est Non African American 05/23/2016 >89  >=60 mL/min Final  . TSH 05/23/2016 1.15  0.40 - 4.50 mIU/L Final  . Cholesterol 05/23/2016 197  <200 mg/dL Final  . Triglycerides 05/23/2016 91  <150 mg/dL Final  . HDL 05/23/2016 45  >40 mg/dL Final  . Total CHOL/HDL Ratio 05/23/2016 4.4  <5.0 Ratio Final  . VLDL 05/23/2016 18   <30 mg/dL Final  . LDL Cholesterol 05/23/2016 134* <100 mg/dL Final  . WBC 05/23/2016 5.6  3.8 - 10.8 K/uL Final  . RBC 05/23/2016 4.84  4.20 - 5.80 MIL/uL Final  . Hemoglobin 05/23/2016 14.3  13.0 - 17.0 g/dL Final  . HCT 05/23/2016 43.0  38.5 - 50.0 % Final  . MCV 05/23/2016 88.8  80.0 - 100.0 fL Final  . MCH 05/23/2016 29.5  27.0 - 33.0 pg Final  . MCHC 05/23/2016 33.3  32.0 - 36.0 g/dL Final  . RDW 05/23/2016 14.5  11.0 - 15.0 % Final  . Platelets 05/23/2016 206  140 - 400 K/uL Final  . MPV 05/23/2016 10.1  7.5 - 12.5 fL Final  . Neutro Abs 05/23/2016 3416  1,500 - 7,800 cells/uL Final  . Lymphs Abs 05/23/2016 1512  850 - 3,900 cells/uL Final  . Monocytes Absolute 05/23/2016 448  200 - 950 cells/uL Final  . Eosinophils Absolute 05/23/2016 168  15 - 500 cells/uL Final  . Basophils Absolute 05/23/2016 56  0 - 200 cells/uL Final  . Neutrophils Relative % 05/23/2016 61  % Final  . Lymphocytes Relative 05/23/2016 27  % Final  . Monocytes  Relative 05/23/2016 8  % Final  . Eosinophils Relative 05/23/2016 3  % Final  . Basophils Relative 05/23/2016 1  % Final  . Smear Review 05/23/2016 Criteria for review not met   Final  . Hgb A1c MFr Bld 05/23/2016 5.5  <5.7 % Final   Comment:   For the purpose of screening for the presence of diabetes:   <5.7%       Consistent with the absence of diabetes 5.7-6.4 %   Consistent with increased risk for diabetes (prediabetes) >=6.5 %     Consistent with diabetes   This assay result is consistent with a decreased risk of diabetes.   Currently, no consensus exists regarding use of hemoglobin A1c for diagnosis of diabetes in children.   According to American Diabetes Association (ADA) guidelines, hemoglobin A1c <7.0% represents optimal control in non-pregnant diabetic patients. Different metrics may apply to specific patient populations. Standards of Medical Care in Diabetes (ADA).     . Mean Plasma Glucose 05/23/2016 111  mg/dL Final  . PSA  05/23/2016 2.2  <=4.0 ng/mL Final   Comment:   The total PSA value from this assay system is standardized against the WHO standard. The test result will be approximately 20% lower when compared to the equimolar-standardized total PSA (Beckman Coulter). Comparison of serial PSA results should be interpreted with this fact in mind.   This test was performed using the Siemens chemiluminescent method. Values obtained from different assay methods cannot be used interchangeably. PSA levels, regardless of value, should not be interpreted as absolute evidence of the presence or absence of disease.      Past Medical History:  Diagnosis Date  . Hx of adenomatous polyp of colon 07/08/2014  . Hyperlipidemia   . Low back pain    Past Surgical History:  Procedure Laterality Date  . COLONOSCOPY  09-17-2003   normal dr Wynetta Emery  . HERNIA REPAIR     Current Outpatient Prescriptions on File Prior to Visit  Medication Sig Dispense Refill  . aspirin 81 MG tablet Take 81 mg by mouth daily.    Marland Kitchen atorvastatin (LIPITOR) 10 MG tablet Take 1 tablet (10 mg total) by mouth daily. Please cancel simvastatin prescription 90 tablet 3  . glucosamine-chondroitin 500-400 MG tablet Take 1 tablet by mouth 3 (three) times daily.    . Multiple Vitamin (MULTIVITAMIN) tablet Take 1 tablet by mouth daily.    . Omega-3 Fatty Acids (FISH OIL CONCENTRATE PO) Take by mouth.    Marland Kitchen OVER THE COUNTER MEDICATION Take 1 capsule by mouth daily. Turmeric For joints     No current facility-administered medications on file prior to visit.    Allergies  Allergen Reactions  . Niaspan [Niacin Er]     Severe flushing   Social History   Social History  . Marital status: Married    Spouse name: N/A  . Number of children: N/A  . Years of education: N/A   Occupational History  . Not on file.   Social History Main Topics  . Smoking status: Never Smoker  . Smokeless tobacco: Never Used  . Alcohol use No  . Drug use: No  .  Sexual activity: Yes     Comment: married.  Works in Therapist, occupational, Psychologist, educational   Other Topics Concern  . Not on file   Social History Narrative  . No narrative on file   Family History  Problem Relation Age of Onset  . Alcohol abuse Brother   . Hyperlipidemia Brother   .  Diabetes Father   . Colon cancer Maternal Grandmother   . Esophageal cancer Neg Hx   . Rectal cancer Neg Hx   . Stomach cancer Neg Hx       Review of Systems  All other systems reviewed and are negative.      Objective:   Physical Exam  Constitutional: He is oriented to person, place, and time. He appears well-developed and well-nourished. No distress.  HENT:  Head: Normocephalic and atraumatic.  Right Ear: External ear normal.  Left Ear: External ear normal.  Nose: Nose normal.  Mouth/Throat: Oropharynx is clear and moist. No oropharyngeal exudate.  Eyes: Conjunctivae and EOM are normal. Pupils are equal, round, and reactive to light. Right eye exhibits no discharge. Left eye exhibits no discharge. No scleral icterus.  Neck: Normal range of motion. Neck supple. No JVD present. No tracheal deviation present. No thyromegaly present.  Cardiovascular: Normal rate, regular rhythm, normal heart sounds and intact distal pulses.  Exam reveals no gallop and no friction rub.   No murmur heard. Pulmonary/Chest: Effort normal and breath sounds normal. No stridor. No respiratory distress. He has no wheezes. He has no rales. He exhibits no tenderness.  Abdominal: Soft. Bowel sounds are normal. He exhibits no distension and no mass. There is no tenderness. There is no rebound and no guarding.  Genitourinary: Rectum normal, prostate normal and penis normal.  Musculoskeletal: Normal range of motion. He exhibits no edema or tenderness.  Lymphadenopathy:    He has no cervical adenopathy.  Neurological: He is alert and oriented to person, place, and time. He has normal reflexes. No cranial nerve deficit. He exhibits  normal muscle tone. Coordination normal.  Skin: Skin is warm. No rash noted. He is not diaphoretic. No erythema. No pallor.  Psychiatric: He has a normal mood and affect. His behavior is normal. Judgment and thought content normal.  Vitals reviewed.         Assessment & Plan:  Routine general medical examination at a health care facility  Pure hypercholesterolemia  Patient's physical exam is up-to-date. His exam is normal. His blood sugar has improved from the last time we checked it. His previous fasting blood sugar was 107. Today it is normal and his hemoglobin A1c is within normal range. Immunizations are up-to-date. Lab work is only significant for a mild increase in his LDL cholesterol. This increases his 10 year risk of cardiovascular disease from 4.2% to 5.4%. Patient will stay on Lipitor but work on diet and exercise to reduce his cholesterol further. Immunizations are up-to-date and cancer screening is up-to-date.

## 2016-07-12 ENCOUNTER — Encounter (INDEPENDENT_AMBULATORY_CARE_PROVIDER_SITE_OTHER): Payer: 59 | Admitting: Family Medicine

## 2016-08-20 MED FILL — ATORVASTATIN 10 MG TABLET: 10 | 90 days supply | Qty: 90 | Fill #1

## 2016-08-22 ENCOUNTER — Encounter: Payer: Self-pay | Admitting: Family Medicine

## 2016-08-22 MED ORDER — MELOXICAM 15 MG PO TABS: 15.0000 mg | ORAL_TABLET | Freq: Every day | ORAL | 1 refills | Status: DC

## 2016-08-22 MED FILL — MELOXICAM 15 MG TABLET: 15 | 90 days supply | Qty: 90 | Fill #0

## 2016-11-30 MED FILL — ATORVASTATIN 10 MG TABLET: 10 | 90 days supply | Qty: 90 | Fill #2

## 2017-01-27 ENCOUNTER — Telehealth: Payer: BLUE CROSS/BLUE SHIELD | Admitting: Family

## 2017-01-27 DIAGNOSIS — W57XXXA Bitten or stung by nonvenomous insect and other nonvenomous arthropods, initial encounter: Secondary | ICD-10-CM

## 2017-01-27 MED ORDER — PREDNISONE 10 MG (21) PO TBPK: ORAL_TABLET | ORAL | 0 refills | Status: DC

## 2017-01-27 MED ORDER — TRIAMCINOLONE ACETONIDE 0.5 % EX OINT: 1.0000 "application " | TOPICAL_OINTMENT | Freq: Two times a day (BID) | CUTANEOUS | 0 refills | Status: DC

## 2017-01-27 NOTE — Progress Notes (Signed)
E Visit for Insect Sting  Thank you for describing the insect sting for [place].  Here is how we plan to help!  Based on the information you have shared with me it looks like you have: A sting that we will treat with a short course of prednisone.  The 2 greatest risks from insect stings are allergic reaction, which can be fatal in some people and infection, which is more common and less serious.  Bees, wasps, yellow jackets, and hornets belong to a class of insects called Hymenoptera.  Most insect stings cause only minor discomfort.  Stings can happen anywhere on the body and can be painful.  Most stings are from honey bees or yellow jackets.  Fire ants can sting multiple times.  The sites of the stings are more likely to become infected.    I have sent in prednisone 10 mg tapering dose for 5 days to the pharmacy you selected.  Please make sure that you selected a pharmacy that is open now. I also sent in Kenalog cream to help with the itching.   What can be used to prevent Insect Stings?   Insect repellant with at least 20% DEET.    Wearing long pants and shirts with socks and shoes.    Wear dark or drab-colored clothes rather than bright colors.    Avoid using perfumes and hair sprays; these attract insects.  HOME CARE ADVICE:  1. Stinger removal:  The stinger looks like a tiny black dot in the sting.  Use a fingernail, credit card edge, or knife-edge to scrape it off.  Don't pull it out because it squeezes out more venom.  If the stinger is below the skin surface, leave it alone.  It will be shed with normal skin healing. 2. Use cold compresses to the area of the sting for 10-20 minutes.  You may repeat this as needed to relieve symptoms of pain and swelling. 3.  For pain relief, take acetominophen 650 mg 4-6 hours as needed or ibuprofen 400 mg every 6-8 hours as needed or naproxen 250-500 mg every 12 hours as needed. 4.  You can also use hydrocortisone cream 0.5% or 1% up to 4  times daily as needed for itching. 5.  If the sting becomes very itchy, take Benadryl 25-50 mg, follow directions on box. 6.  Wash the area 2-3 times daily with antibacterial soap and warm water. 7. Call your Doctor if:  Fever, a severe headache, or rash occur in the next 2 weeks.  Sting area begins to look infected.  Redness and swelling worsens after home treatment.  Your current symptoms become worse.    MAKE SURE YOU:   Understand these instructions.  Will watch your condition.  Will get help right away if you are not doing well or get worse.  Thank you for choosing an e-visit. Your e-visit answers were reviewed by a board certified advanced clinical practitioner to complete your personal care plan. Depending upon the condition, your plan could have included both over the counter or prescription medications. Please review your pharmacy choice. Be sure that the pharmacy you have chosen is open so that you can pick up your prescription now.  If there is a problem you may message your provider in [place] to have the prescription routed to another pharmacy. Your safety is important to [place]. If you have drug allergies check your prescription carefully.  For the next 24 hours, you can use MyChart to ask questions about today's  visit, request a non-urgent call back, or ask for a work or school excuse from your e-visit provider. You will get an email in the next two days asking about your experience. I hope that your e-visit has been valuable and will speed your recovery.

## 2017-02-06 ENCOUNTER — Ambulatory Visit (INDEPENDENT_AMBULATORY_CARE_PROVIDER_SITE_OTHER): Payer: 59 | Admitting: Family Medicine

## 2017-02-06 DIAGNOSIS — Z23 Encounter for immunization: Secondary | ICD-10-CM

## 2017-03-05 MED FILL — ATORVASTATIN 10 MG TABLET: 10 | 90 days supply | Qty: 90 | Fill #3

## 2017-03-27 DIAGNOSIS — H35711 Central serous chorioretinopathy, right eye: Secondary | ICD-10-CM | POA: Diagnosis not present

## 2017-05-20 ENCOUNTER — Other Ambulatory Visit: Payer: 59

## 2017-05-20 DIAGNOSIS — Z0000 Encounter for general adult medical examination without abnormal findings: Secondary | ICD-10-CM | POA: Diagnosis not present

## 2017-05-20 DIAGNOSIS — E7800 Pure hypercholesterolemia, unspecified: Secondary | ICD-10-CM

## 2017-05-21 LAB — COMPREHENSIVE METABOLIC PANEL
AG RATIO: 2 (calc) (ref 1.0–2.5)
ALBUMIN MSPROF: 4 g/dL (ref 3.6–5.1)
ALT: 14 U/L (ref 9–46)
AST: 17 U/L (ref 10–35)
Alkaline phosphatase (APISO): 59 U/L (ref 40–115)
BUN: 16 mg/dL (ref 7–25)
CHLORIDE: 106 mmol/L (ref 98–110)
CO2: 28 mmol/L (ref 20–32)
Calcium: 8.8 mg/dL (ref 8.6–10.3)
Creat: 0.73 mg/dL (ref 0.70–1.33)
GLOBULIN: 2 g/dL (ref 1.9–3.7)
GLUCOSE: 95 mg/dL (ref 65–99)
POTASSIUM: 4.2 mmol/L (ref 3.5–5.3)
SODIUM: 142 mmol/L (ref 135–146)
Total Bilirubin: 1 mg/dL (ref 0.2–1.2)
Total Protein: 6 g/dL — ABNORMAL LOW (ref 6.1–8.1)

## 2017-05-21 LAB — CBC WITH DIFFERENTIAL/PLATELET
BASOS PCT: 1 %
Basophils Absolute: 48 cells/uL (ref 0–200)
EOS ABS: 202 {cells}/uL (ref 15–500)
Eosinophils Relative: 4.2 %
HCT: 41.7 % (ref 38.5–50.0)
HEMOGLOBIN: 14.2 g/dL (ref 13.2–17.1)
Lymphs Abs: 1219 cells/uL (ref 850–3900)
MCH: 30.7 pg (ref 27.0–33.0)
MCHC: 34.1 g/dL (ref 32.0–36.0)
MCV: 90.1 fL (ref 80.0–100.0)
MONOS PCT: 8.8 %
MPV: 10.2 fL (ref 7.5–12.5)
NEUTROS ABS: 2909 {cells}/uL (ref 1500–7800)
Neutrophils Relative %: 60.6 %
Platelets: 201 10*3/uL (ref 140–400)
RBC: 4.63 10*6/uL (ref 4.20–5.80)
RDW: 12.8 % (ref 11.0–15.0)
Total Lymphocyte: 25.4 %
WBC: 4.8 10*3/uL (ref 3.8–10.8)
WBCMIX: 422 {cells}/uL (ref 200–950)

## 2017-05-21 LAB — LIPID PANEL
CHOL/HDL RATIO: 3.9 (calc) (ref <5.0)
Cholesterol: 181 mg/dL (ref <200)
HDL: 47 mg/dL (ref >40)
LDL Cholesterol (Calc): 119 mg/dL (calc) — ABNORMAL HIGH
NON-HDL CHOLESTEROL (CALC): 134 mg/dL — AB (ref <130)
Triglycerides: 55 mg/dL (ref <150)

## 2017-05-21 LAB — PSA: PSA: 1.7 ng/mL (ref <=4.0)

## 2017-05-30 ENCOUNTER — Ambulatory Visit (INDEPENDENT_AMBULATORY_CARE_PROVIDER_SITE_OTHER): Payer: 59 | Admitting: Family Medicine

## 2017-05-30 ENCOUNTER — Encounter: Payer: Self-pay | Admitting: Family Medicine

## 2017-05-30 VITALS — BP 100/70 | HR 80 | Temp 98.2°F | Resp 14 | Ht 73.0 in | Wt 209.0 lb

## 2017-05-30 DIAGNOSIS — Z0000 Encounter for general adult medical examination without abnormal findings: Secondary | ICD-10-CM

## 2017-05-30 DIAGNOSIS — E7800 Pure hypercholesterolemia, unspecified: Secondary | ICD-10-CM

## 2017-05-30 MED ORDER — ATORVASTATIN CALCIUM 10 MG PO TABS: 10.0000 mg | ORAL_TABLET | Freq: Every day | ORAL | 3 refills | Status: DC

## 2017-05-30 MED ORDER — METAXALONE 800 MG PO TABS: 800.0000 mg | ORAL_TABLET | Freq: Three times a day (TID) | ORAL | 1 refills | Status: DC

## 2017-05-30 MED ORDER — HYDROCODONE-HOMATROPINE 5-1.5 MG/5ML PO SYRP: 5.0000 mL | ORAL_SOLUTION | Freq: Three times a day (TID) | ORAL | 0 refills | Status: DC | PRN

## 2017-05-30 NOTE — Progress Notes (Signed)
Subjective:    [name], male    DOB: January 01, 1961, 57 y.o.   MRN: [id number]  HPI Patient is here today for complete physical exam. Patient just had a colonoscopy in February of 2016. He did have a tubular adenoma. Repeat colonoscopy was recommended in 2021.  He is due for shingrix.  Other vaccines are UTD.  Reports a cough for 4 weeks that is non productive but slowly getting better.  Denies fever, chills, sob, doe.  Has lost 30 lbs.  His most recent lab work is listed below: Lab on 05/20/2017  Component Date Value Ref Range Status  . WBC 05/20/2017 4.8  3.8 - 10.8 Thousand/uL Final  . RBC 05/20/2017 4.63  4.20 - 5.80 Million/uL Final  . Hemoglobin 05/20/2017 14.2  13.2 - 17.1 g/dL Final  . HCT 05/20/2017 41.7  38.5 - 50.0 % Final  . MCV 05/20/2017 90.1  80.0 - 100.0 fL Final  . MCH 05/20/2017 30.7  27.0 - 33.0 pg Final  . MCHC 05/20/2017 34.1  32.0 - 36.0 g/dL Final  . RDW 05/20/2017 12.8  11.0 - 15.0 % Final  . Platelets 05/20/2017 201  140 - 400 Thousand/uL Final  . MPV 05/20/2017 10.2  7.5 - 12.5 fL Final  . Neutro Abs 05/20/2017 2,909  1,500 - 7,800 cells/uL Final  . Lymphs Abs 05/20/2017 1,219  850 - 3,900 cells/uL Final  . WBC mixed population 05/20/2017 422  200 - 950 cells/uL Final  . Eosinophils Absolute 05/20/2017 202  15 - 500 cells/uL Final  . Basophils Absolute 05/20/2017 48  0 - 200 cells/uL Final  . Neutrophils Relative % 05/20/2017 60.6  % Final  . Total Lymphocyte 05/20/2017 25.4  % Final  . Monocytes Relative 05/20/2017 8.8  % Final  . Eosinophils Relative 05/20/2017 4.2  % Final  . Basophils Relative 05/20/2017 1.0  % Final  . Glucose, Bld 05/20/2017 95  65 - 99 mg/dL Final   Comment: .            Fasting reference interval .   . BUN 05/20/2017 16  7 - 25 mg/dL Final  . Creat 05/20/2017 0.73  0.70 - 1.33 mg/dL Final   Comment: For patients >97 years of age, the reference limit for Creatinine is approximately 13% higher for  people identified as African-American. .   Havery Moros Ratio 42/70/6237 NOT APPLICABLE  6 - 22 (calc) Final  . Sodium 05/20/2017 142  135 - 146 mmol/L Final  . Potassium 05/20/2017 4.2  3.5 - 5.3 mmol/L Final  . Chloride 05/20/2017 106  98 - 110 mmol/L Final  . CO2 05/20/2017 28  20 - 32 mmol/L Final  . Calcium 05/20/2017 8.8  8.6 - 10.3 mg/dL Final  . Total Protein 05/20/2017 6.0* 6.1 - 8.1 g/dL Final  . Albumin 05/20/2017 4.0  3.6 - 5.1 g/dL Final  . Globulin 05/20/2017 2.0  1.9 - 3.7 g/dL (calc) Final  . AG Ratio 05/20/2017 2.0  1.0 - 2.5 (calc) Final  . Total Bilirubin 05/20/2017 1.0  0.2 - 1.2 mg/dL Final  . Alkaline phosphatase (APISO) 05/20/2017 59  40 - 115 U/L Final  . AST 05/20/2017 17  10 - 35 U/L Final  . ALT 05/20/2017 14  9 - 46 U/L Final  . PSA 05/20/2017 1.7  < OR = 4.0 ng/mL Final   Comment: The total PSA value from this assay system is  standardized against the WHO standard. The test  result will be approximately 20% lower when compared  to the equimolar-standardized total PSA (Beckman  Coulter). Comparison of serial PSA results should be  interpreted with this fact in mind. . This test was performed using the Siemens  chemiluminescent method. Values obtained from  different assay methods cannot be used interchangeably. PSA levels, regardless of value, should not be interpreted as absolute evidence of the presence or absence of disease.   . Cholesterol 05/20/2017 181  <200 mg/dL Final  . HDL 05/20/2017 47  >40 mg/dL Final  . Triglycerides 05/20/2017 55  <150 mg/dL Final  . LDL Cholesterol (Calc) 05/20/2017 119* mg/dL (calc) Final   Comment: Reference range: <100 . Desirable range <100 mg/dL for primary prevention;   <70 mg/dL for patients with CHD or diabetic patients  with > or = 2 CHD risk factors. Marland Kitchen LDL-C is now calculated using the Martin-Hopkins  calculation, which is a validated novel method providing  better accuracy than the Friedewald  equation in the  estimation of LDL-C.  Cresenciano Genre et al. Annamaria Helling. 5102;585(27): 2061-2068  (http://education.[facility].com/faq/FAQ164)   . Total CHOL/HDL Ratio 05/20/2017 3.9  <5.0 (calc) Final  . Non-HDL Cholesterol (Calc) 05/20/2017 134* <130 mg/dL (calc) Final   Comment: For patients with diabetes plus 1 major ASCVD risk  factor, treating to a non-HDL-C goal of <100 mg/dL  (LDL-C of <70 mg/dL) is considered a therapeutic  option.    Past Medical History:  Diagnosis Date  . Hx of adenomatous polyp of colon 07/08/2014  . Hyperlipidemia   . Low back pain    Past Surgical History:  Procedure Laterality Date  . COLONOSCOPY  09-17-2003   normal dr Wynetta Emery  . HERNIA REPAIR     Current Outpatient Medications on File Prior to Visit  Medication Sig Dispense Refill  . aspirin 81 MG tablet Take 81 mg by mouth daily.    . Multiple Vitamin (MULTIVITAMIN) tablet Take 1 tablet by mouth daily.    . Omega-3 Fatty Acids (FISH OIL CONCENTRATE PO) Take by mouth.     No current facility-administered medications on file prior to visit.    Allergies  Allergen Reactions  . Niaspan [Niacin Er]     Severe flushing   Social History   Socioeconomic History  . Marital status: Married    Spouse name: Not on file  . Number of children: Not on file  . Years of education: Not on file  . Highest education level: Not on file  Social Needs  . Financial resource strain: Not on file  . Food insecurity - worry: Not on file  . Food insecurity - inability: Not on file  . Transportation needs - medical: Not on file  . Transportation needs - non-medical: Not on file  Occupational History  . Not on file  Tobacco Use  . Smoking status: Never Smoker  . Smokeless tobacco: Never Used  Substance and Sexual Activity  . Alcohol use: No    Alcohol/week: 0.0 oz  . Drug use: No  . Sexual activity: Yes    Comment: married.  Works in Therapist, occupational, Psychologist, educational  Other Topics Concern  . Not on file  Social  History Narrative  . Not on file   Family History  Problem Relation Age of Onset  . Alcohol abuse Brother   . Hyperlipidemia Brother   . Diabetes Father   . Colon cancer Maternal Grandmother   . Esophageal cancer Neg Hx   . Rectal cancer Neg Hx   . Stomach  cancer Neg Hx       Review of Systems  All other systems reviewed and are negative.      Objective:   Physical Exam  Constitutional: He is oriented to person, place, and time. He appears well-developed and well-nourished. No distress.  HENT:  Head: Normocephalic and atraumatic.  Right Ear: External ear normal.  Left Ear: External ear normal.  Nose: Nose normal.  Mouth/Throat: Oropharynx is clear and moist. No oropharyngeal exudate.  Eyes: Conjunctivae and EOM are normal. Pupils are equal, round, and reactive to light. Right eye exhibits no discharge. Left eye exhibits no discharge. No scleral icterus.  Neck: Normal range of motion. Neck supple. No JVD present. No tracheal deviation present. No thyromegaly present.  Cardiovascular: Normal rate, regular rhythm, normal heart sounds and intact distal pulses. Exam reveals no gallop and no friction rub.  No murmur heard. Pulmonary/Chest: Effort normal and breath sounds normal. No stridor. No respiratory distress. He has no wheezes. He has no rales. He exhibits no tenderness.  Abdominal: Soft. Bowel sounds are normal. He exhibits no distension and no mass. There is no tenderness. There is no rebound and no guarding.  Genitourinary: Rectum normal, prostate normal and penis normal.  Musculoskeletal: Normal range of motion. He exhibits no edema or tenderness.  Lymphadenopathy:    He has no cervical adenopathy.  Neurological: He is alert and oriented to person, place, and time. He has normal reflexes. No cranial nerve deficit. He exhibits normal muscle tone. Coordination normal.  Skin: Skin is warm. No rash noted. He is not diaphoretic. No erythema. No pallor.  Psychiatric: He has a  normal mood and affect. His behavior is normal. Judgment and thought content normal.  Vitals reviewed.         Assessment & Plan:  Routine general medical examination at a health care facility  Pure hypercholesterolemia  Patient's physical exam is up-to-date. His exam is normal. Labs are excellent.  Recommended shingrix.    Immunizations and cancer screening is utd.  Hycodan 1 tsp poq6 hrs prn cough, CXR if no better in 2 weeks.  Otherwise, CPE is excellent.

## 2017-06-10 ENCOUNTER — Encounter: Payer: Self-pay | Admitting: Family Medicine

## 2017-06-12 ENCOUNTER — Other Ambulatory Visit: Payer: Self-pay

## 2017-06-12 ENCOUNTER — Encounter: Payer: Self-pay | Admitting: Physician Assistant

## 2017-06-12 ENCOUNTER — Ambulatory Visit (INDEPENDENT_AMBULATORY_CARE_PROVIDER_SITE_OTHER): Payer: 59 | Admitting: Physician Assistant

## 2017-06-12 ENCOUNTER — Telehealth: Payer: Self-pay

## 2017-06-12 VITALS — BP 132/76 | HR 73 | Temp 98.0°F | Resp 14 | Ht 73.0 in | Wt 212.2 lb

## 2017-06-12 DIAGNOSIS — H65112 Acute and subacute allergic otitis media (mucoid) (sanguinous) (serous), left ear: Secondary | ICD-10-CM | POA: Diagnosis not present

## 2017-06-12 DIAGNOSIS — H60392 Other infective otitis externa, left ear: Secondary | ICD-10-CM

## 2017-06-12 DIAGNOSIS — H6123 Impacted cerumen, bilateral: Secondary | ICD-10-CM

## 2017-06-12 MED ORDER — ATORVASTATIN CALCIUM 10 MG PO TABS: 10.0000 mg | ORAL_TABLET | Freq: Every day | ORAL | 3 refills | Status: DC

## 2017-06-12 MED ORDER — OFLOXACIN 0.3 % OT SOLN: 5.0000 [drp] | Freq: Every day | OTIC | 0 refills | Status: AC

## 2017-06-12 MED ORDER — AMOXICILLIN-POT CLAVULANATE 875-125 MG PO TABS: 1.0000 | ORAL_TABLET | Freq: Two times a day (BID) | ORAL | 0 refills | Status: AC

## 2017-06-12 MED FILL — AMOX-CLAV 875-125 MG TABLET: 875-125 | 7 days supply | Qty: 14 | Fill #0

## 2017-06-12 MED FILL — OFLOXACIN 0.3% EYE DROPS: 0.3 | 40 days supply | Qty: 10 | Fill #0

## 2017-06-12 MED FILL — ATORVASTATIN 10 MG TABLET: 10 | 90 days supply | Qty: 90 | Fill #0

## 2017-06-12 NOTE — Progress Notes (Addendum)
[name] MRN: [id number], DOB: 10/26/1960, 57 y.o. Date of Encounter: 06/12/2017, 9:55 AM    Chief Complaint:  Chief Complaint  Patient presents with  . left ear pain    right ear ache x1week     HPI: 57 y.o. year old male presents with above.   He reports that he recently was here for complete physical exam with Dr. Dennard Schaumann.  Was told that he had "wax in his ears ".  After that, his wife started putting drops of Debrox in his ears.  States that they also have used the bulb that comes with that and has flushed it out.  Says that he got "a large hunk out of the left ear "and then had some relief but then it felt stopped up again.  In the left ear he is also having decreased hearing and ringing sound in the left ear for the last few days.     Home Meds:   Outpatient Medications Prior to Visit  Medication Sig Dispense Refill  . aspirin 81 MG tablet Take 81 mg by mouth daily.    Marland Kitchen atorvastatin (LIPITOR) 10 MG tablet Take 1 tablet (10 mg total) by mouth daily. Please cancel simvastatin prescription 90 tablet 3  . HYDROcodone-homatropine (HYCODAN) 5-1.5 MG/5ML syrup Take 5 mLs by mouth every 8 (eight) hours as needed for cough. 120 mL 0  . metaxalone (SKELAXIN) 800 MG tablet Take 1 tablet (800 mg total) by mouth 3 (three) times daily. (Patient taking differently: Take 800 mg by mouth 3 (three) times daily as needed. ) 90 tablet 1  . Multiple Vitamin (MULTIVITAMIN) tablet Take 1 tablet by mouth daily.    . Omega-3 Fatty Acids (FISH OIL CONCENTRATE PO) Take by mouth.     No facility-administered medications prior to visit.     Allergies:  Allergies  Allergen Reactions  . Niaspan [Niacin Er]     Severe flushing      Review of Systems: See HPI for pertinent ROS. All other ROS negative.    Physical Exam: Blood pressure 132/76, pulse 73, temperature 98 F (36.7 C), temperature source Oral, resp. rate 14, height 6\' 1"  (1.854 m), weight 96.3 kg (212 lb 3.2 oz),  SpO2 96 %., Body mass index is 28 kg/m. General:  WNWD WM. Appears in no acute distress. HEENT: Right ear canal is completely obstructed with hard cerumen.  Left ear canal is obstructed with soft cerumen. Repeat Examination after irrigation performed:  Right Ear canal, TM appear normal.  Left TM appears severely retracted, deep/dull shade of erythema. Canal also abnormal with inflammation. Neck: Supple. No thyromegaly. No lymphadenopathy. Lungs: Clear bilaterally to auscultation without wheezes, rales, or rhonchi. Breathing is unlabored. Heart: Regular rhythm. No murmurs, rubs, or gallops. Msk:  Strength and tone normal for age. Extremities/Skin: Warm and dry.  Neuro: Alert and oriented X 3. Moves all extremities spontaneously. Gait is normal. CNII-XII grossly in tact. Psych:  Responds to questions appropriately with a normal affect.     ASSESSMENT AND PLAN:  57 y.o. year old male with  1. Bilateral impacted cerumen Both the ears are irrigated today at our office.  In the future he can use the Debrox to prevent recurrence.   2. Acute mucoid otitis media of left ear Antibiotic as directed and complete all of it.  Follow-up if symptoms do not resolve after completion of this. - amoxicillin-clavulanate (AUGMENTIN) 875-125 MG tablet; Take 1 tablet by mouth every 12 (twelve) hours  for 7 days.  Dispense: 14 tablet; Refill: 0  3. Infective otitis externa of left ear Apply drops to the left ear as directed.  Follow-up if symptoms do not resolve after completion of this. - ofloxacin (FLOXIN) 0.3 % OTIC solution; Place 5 drops into the left ear daily for 7 days.  Dispense: 10 mL; Refill: 0   Signed, [address] [place], [place], [facility] 06/12/2017 9:55 AM

## 2017-06-12 NOTE — Telephone Encounter (Signed)
Call placed to [facility] and spoke with Elmo Putt notified them that PCP had approved request to make switch from ear drops to eye drops and use eye drops ofloxacin in ears

## 2017-06-12 NOTE — Telephone Encounter (Signed)
Approved.  

## 2017-06-12 NOTE — Addendum Note (Signed)
Addended by: Dena Billet B on: 06/12/2017 10:22 AM   Modules accepted: Orders

## 2017-06-12 NOTE — Telephone Encounter (Signed)
Pharmacy called and states the ofloxacin  ear drops will cost the patient $131.00, but they can switch to ofloxacin eye drops and the patient can still use them as ear drops for $33.00.  Pharmacy is asking for permission to make this switch

## 2017-06-25 ENCOUNTER — Ambulatory Visit (INDEPENDENT_AMBULATORY_CARE_PROVIDER_SITE_OTHER): Payer: 59 | Admitting: Family Medicine

## 2017-06-25 ENCOUNTER — Encounter: Payer: Self-pay | Admitting: Family Medicine

## 2017-06-25 VITALS — BP 108/62 | HR 64 | Temp 98.5°F | Resp 16 | Ht 73.0 in | Wt 210.0 lb

## 2017-06-25 DIAGNOSIS — H60392 Other infective otitis externa, left ear: Secondary | ICD-10-CM

## 2017-06-25 MED ORDER — CIPROFLOXACIN-DEXAMETHASONE 0.3-0.1 % OT SUSP
4.0000 [drp] | Freq: Two times a day (BID) | OTIC | 0 refills | Status: DC
Start: 2017-06-25 — End: 2018-06-03

## 2017-06-25 MED ORDER — NEOMYCIN-POLYMYXIN-HC 3.5-10000-1 OT SOLN: 4.0000 [drp] | Freq: Four times a day (QID) | OTIC | 0 refills | Status: DC

## 2017-06-25 MED FILL — CIPRODEX OTIC SUSPENSION: 0.3-0.1 | 18 days supply | Qty: 8 | Fill #0

## 2017-06-25 NOTE — Progress Notes (Signed)
Subjective:    [name], male    DOB: [birth year], 57 y.o.   MRN: [id number]  HPI Patient was diagnosed with otitis externa at his last visit and was started on Augmentin as well as ofloxacin otic drops.  The pain in his ear has not improved.  He continues to report pain and pressure in his left ear.  He reports hearing loss in his left ear.  He reports symptoms of eustachian tube dysfunction in his left ear.  Examination of the left auditory canal reveals copious white purulent exudate covering all surfaces of the auditory canal and almost completely obscuring visualization of the tympanic membrane Past Medical History:  Diagnosis Date  . Hx of adenomatous polyp of colon 07/08/2014  . Hyperlipidemia   . Low back pain    Past Surgical History:  Procedure Laterality Date  . COLONOSCOPY  09-17-2003   normal dr Wynetta Emery  . HERNIA REPAIR     Current Outpatient Medications on File Prior to Visit  Medication Sig Dispense Refill  . aspirin 81 MG tablet Take 81 mg by mouth daily.    Marland Kitchen atorvastatin (LIPITOR) 10 MG tablet Take 1 tablet (10 mg total) by mouth daily. Please cancel simvastatin prescription 90 tablet 3  . HYDROcodone-homatropine (HYCODAN) 5-1.5 MG/5ML syrup Take 5 mLs by mouth every 8 (eight) hours as needed for cough. 120 mL 0  . metaxalone (SKELAXIN) 800 MG tablet Take 1 tablet (800 mg total) by mouth 3 (three) times daily. (Patient taking differently: Take 800 mg by mouth 3 (three) times daily as needed. ) 90 tablet 1  . Multiple Vitamin (MULTIVITAMIN) tablet Take 1 tablet by mouth daily.    . Omega-3 Fatty Acids (FISH OIL CONCENTRATE PO) Take by mouth.     No current facility-administered medications on file prior to visit.    Allergies  Allergen Reactions  . Niaspan [Niacin Er]     Severe flushing   Social History   Socioeconomic History  . Marital status: Married    Spouse name: Not on file  . Number of children: Not on file  . Years of education: Not  on file  . Highest education level: Not on file  Social Needs  . Financial resource strain: Not on file  . Food insecurity - worry: Not on file  . Food insecurity - inability: Not on file  . Transportation needs - medical: Not on file  . Transportation needs - non-medical: Not on file  Occupational History  . Not on file  Tobacco Use  . Smoking status: Never Smoker  . Smokeless tobacco: Never Used  Substance and Sexual Activity  . Alcohol use: No    Alcohol/week: 0.0 oz  . Drug use: No  . Sexual activity: Yes    Comment: married.  Works in Therapist, occupational, Psychologist, educational  Other Topics Concern  . Not on file  Social History Narrative  . Not on file      Review of Systems  All other systems reviewed and are negative.      Objective:   Physical Exam  HENT:  Right Ear: Tympanic membrane and ear canal normal.  Left Ear: There is drainage, swelling and tenderness.  Mouth/Throat: Oropharynx is clear and moist.  Neck: Neck supple.  Cardiovascular: Normal rate, regular rhythm and normal heart sounds.  Pulmonary/Chest: Effort normal and breath sounds normal.  Vitals reviewed.         Assessment & Plan:  Other infective acute otitis externa of  left ear - Plan: Ambulatory referral to ENT  Patient has severe otitis externa.  Begin Ciprodex otic drops, 4 drops twice daily for 7 days.  If no better, consult ENT for application of desiccant powder for refractory otitis externa.

## 2017-06-27 DIAGNOSIS — H9012 Conductive hearing loss, unilateral, left ear, with unrestricted hearing on the contralateral side: Secondary | ICD-10-CM | POA: Diagnosis not present

## 2017-06-27 DIAGNOSIS — H60332 Swimmer's ear, left ear: Secondary | ICD-10-CM | POA: Diagnosis not present

## 2017-07-01 DIAGNOSIS — H60392 Other infective otitis externa, left ear: Secondary | ICD-10-CM | POA: Diagnosis not present

## 2017-07-01 DIAGNOSIS — H93292 Other abnormal auditory perceptions, left ear: Secondary | ICD-10-CM | POA: Diagnosis not present

## 2017-09-03 MED FILL — ATORVASTATIN 10 MG TABLET: 10 | 90 days supply | Qty: 90 | Fill #1

## 2017-09-23 ENCOUNTER — Encounter: Payer: Self-pay | Admitting: Family Medicine

## 2017-09-23 MED ORDER — METAXALONE 800 MG PO TABS: 800.0000 mg | ORAL_TABLET | Freq: Three times a day (TID) | ORAL | 1 refills | Status: DC | PRN

## 2017-09-24 ENCOUNTER — Telehealth: Payer: Self-pay | Admitting: *Deleted

## 2017-09-24 MED ORDER — TIZANIDINE HCL 4 MG PO TABS: 4.0000 mg | ORAL_TABLET | Freq: Four times a day (QID) | ORAL | 1 refills | Status: DC | PRN

## 2017-09-24 MED FILL — tiZANidine HCL 4 MG TABS: 4 | 15 days supply | Qty: 60 | Fill #0

## 2017-09-24 NOTE — Telephone Encounter (Signed)
Received fax requesting alternative to Skelaxin ($129 co-pay).   Alternatives include (~$5 co-pay) : Tizanidine Methocarbamol Cyclobenzaprine  Patient requesting option with the least amount of drowsiness/ somnolence.   MD please advise.

## 2017-09-24 NOTE — Telephone Encounter (Signed)
xanaflex 4 mg poq6hrs prn pain (60)

## 2017-09-24 NOTE — Telephone Encounter (Signed)
Medication called/sent to requested pharmacy and pt's wife aware via vm

## 2017-12-12 MED FILL — NAPROXEN 500 MG TABLET: 500 | 4 days supply | Qty: 12 | Fill #0

## 2017-12-19 MED FILL — ATORVASTATIN 10 MG TABLET: 10 | 90 days supply | Qty: 90 | Fill #2

## 2018-02-11 ENCOUNTER — Other Ambulatory Visit: Payer: Self-pay

## 2018-02-11 ENCOUNTER — Ambulatory Visit (INDEPENDENT_AMBULATORY_CARE_PROVIDER_SITE_OTHER): Payer: 59

## 2018-02-11 DIAGNOSIS — Z23 Encounter for immunization: Secondary | ICD-10-CM

## 2018-02-11 MED ORDER — ZOSTER VAC RECOMB ADJUVANTED 50 MCG/0.5ML IM SUSR: 0.5000 mL | Freq: Once | INTRAMUSCULAR | 0 refills | Status: AC

## 2018-02-11 MED FILL — SHINGRIX 50 MCG SUS: 50 | 1 days supply | Qty: 1 | Fill #0

## 2018-02-11 NOTE — Progress Notes (Signed)
Patient was in office to receive his flu vaccine. Patient received the vaccine in his right deltoid. Patient tolerated well.

## 2018-04-01 DIAGNOSIS — H35711 Central serous chorioretinopathy, right eye: Secondary | ICD-10-CM | POA: Diagnosis not present

## 2018-04-02 ENCOUNTER — Encounter: Payer: Self-pay | Admitting: Family Medicine

## 2018-04-03 MED ORDER — ATORVASTATIN CALCIUM 10 MG PO TABS: 10.0000 mg | ORAL_TABLET | Freq: Every day | ORAL | 3 refills | Status: DC

## 2018-04-03 MED FILL — ATORVASTATIN 10 MG TABLET: 10 | 90 days supply | Qty: 90 | Fill #0

## 2018-04-21 MED FILL — SHINGRIX 50 MCG SUS: 50 | 1 days supply | Qty: 1 | Fill #1

## 2018-05-05 MED FILL — SHINGRIX 50 MCG SUS: 50 | 1 days supply | Qty: 1 | Fill #1

## 2018-05-29 ENCOUNTER — Other Ambulatory Visit: Payer: 59

## 2018-05-30 ENCOUNTER — Other Ambulatory Visit: Payer: 59

## 2018-05-30 DIAGNOSIS — R7303 Prediabetes: Secondary | ICD-10-CM

## 2018-05-30 DIAGNOSIS — Z0000 Encounter for general adult medical examination without abnormal findings: Secondary | ICD-10-CM | POA: Diagnosis not present

## 2018-05-30 DIAGNOSIS — E7800 Pure hypercholesterolemia, unspecified: Secondary | ICD-10-CM

## 2018-05-30 LAB — CBC WITH DIFFERENTIAL/PLATELET
ABSOLUTE MONOCYTES: 435 {cells}/uL (ref 200–950)
BASOS PCT: 1 %
Basophils Absolute: 41 cells/uL (ref 0–200)
EOS ABS: 275 {cells}/uL (ref 15–500)
Eosinophils Relative: 6.7 %
HCT: 42.3 % (ref 38.5–50.0)
HEMOGLOBIN: 14.7 g/dL (ref 13.2–17.1)
Lymphs Abs: 1267 cells/uL (ref 850–3900)
MCH: 31.1 pg (ref 27.0–33.0)
MCHC: 34.8 g/dL (ref 32.0–36.0)
MCV: 89.6 fL (ref 80.0–100.0)
MPV: 10.4 fL (ref 7.5–12.5)
Monocytes Relative: 10.6 %
NEUTROS ABS: 2083 {cells}/uL (ref 1500–7800)
Neutrophils Relative %: 50.8 %
Platelets: 201 10*3/uL (ref 140–400)
RBC: 4.72 10*6/uL (ref 4.20–5.80)
RDW: 12.4 % (ref 11.0–15.0)
Total Lymphocyte: 30.9 %
WBC: 4.1 10*3/uL (ref 3.8–10.8)

## 2018-05-30 LAB — COMPREHENSIVE METABOLIC PANEL
AG RATIO: 2.4 (calc) (ref 1.0–2.5)
ALKALINE PHOSPHATASE (APISO): 50 U/L (ref 40–115)
ALT: 16 U/L (ref 9–46)
AST: 21 U/L (ref 10–35)
Albumin: 4.6 g/dL (ref 3.6–5.1)
BILIRUBIN TOTAL: 1.7 mg/dL — AB (ref 0.2–1.2)
BUN: 21 mg/dL (ref 7–25)
CHLORIDE: 106 mmol/L (ref 98–110)
CO2: 28 mmol/L (ref 20–32)
Calcium: 9.4 mg/dL (ref 8.6–10.3)
Creat: 0.73 mg/dL (ref 0.70–1.33)
Globulin: 1.9 g/dL (calc) (ref 1.9–3.7)
Glucose, Bld: 92 mg/dL (ref 65–99)
Potassium: 4.7 mmol/L (ref 3.5–5.3)
Sodium: 141 mmol/L (ref 135–146)
Total Protein: 6.5 g/dL (ref 6.1–8.1)

## 2018-05-30 LAB — LIPID PANEL
CHOL/HDL RATIO: 3.5 (calc) (ref <5.0)
Cholesterol: 183 mg/dL (ref <200)
HDL: 53 mg/dL (ref >40)
LDL Cholesterol (Calc): 116 mg/dL (calc) — ABNORMAL HIGH
NON-HDL CHOLESTEROL (CALC): 130 mg/dL — AB (ref <130)
Triglycerides: 51 mg/dL (ref <150)

## 2018-05-30 LAB — PSA: PSA: 2 ng/mL (ref <=4.0)

## 2018-06-03 ENCOUNTER — Ambulatory Visit (INDEPENDENT_AMBULATORY_CARE_PROVIDER_SITE_OTHER): Payer: 59 | Admitting: Family Medicine

## 2018-06-03 ENCOUNTER — Encounter: Payer: Self-pay | Admitting: Family Medicine

## 2018-06-03 VITALS — BP 122/80 | HR 64 | Temp 98.0°F | Resp 16 | Ht 73.0 in | Wt 213.0 lb

## 2018-06-03 DIAGNOSIS — Z0000 Encounter for general adult medical examination without abnormal findings: Secondary | ICD-10-CM | POA: Diagnosis not present

## 2018-06-03 DIAGNOSIS — Z86010 Personal history of colonic polyps: Secondary | ICD-10-CM | POA: Diagnosis not present

## 2018-06-03 DIAGNOSIS — M19041 Primary osteoarthritis, right hand: Secondary | ICD-10-CM | POA: Diagnosis not present

## 2018-06-03 DIAGNOSIS — E7800 Pure hypercholesterolemia, unspecified: Secondary | ICD-10-CM | POA: Diagnosis not present

## 2018-06-03 MED ORDER — MELOXICAM 15 MG PO TABS: 15.0000 mg | ORAL_TABLET | Freq: Every day | ORAL | 3 refills | Status: DC

## 2018-06-03 NOTE — Progress Notes (Signed)
Subjective:    [name], male    DOB: 09-Mar-1961, 58 y.o.   MRN: [id number]  HPI Patient is here today for complete physical exam. Patient had a colonoscopy in February of 2016. He did have a tubular adenoma. Repeat colonoscopy was recommended in 2021.  Patient is doing exceptionally well.  He does report pain and stiffness in the PIP and DIP joints on both hands.  It is particularly worse on his right hand.  There is radial deviation of his second DIP joint.  He is unable to completely flex his third PIP joint due to stiffness.  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  I believe this is most likely due to his job.  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  He is not taking any anti-inflammatory.  He is due for HIV and hepatitis C screening however he politely declines this.  His immunizations are all up-to-date including Shingrix vaccine which he received last month.  Not yet due for Pneumovax or Prevnar.   his most recent lab work is listed below: Lab on 05/30/2018  Component Date Value Ref Range Status  . WBC 05/30/2018 4.1  3.8 - 10.8 Thousand/uL Final  . RBC 05/30/2018 4.72  4.20 - 5.80 Million/uL Final  . Hemoglobin 05/30/2018 14.7  13.2 - 17.1 g/dL Final  . HCT 05/30/2018 42.3  38.5 - 50.0 % Final  . MCV 05/30/2018 89.6  80.0 - 100.0 fL Final  . MCH 05/30/2018 31.1  27.0 - 33.0 pg Final  . MCHC 05/30/2018 34.8  32.0 - 36.0 g/dL Final  . RDW 05/30/2018 12.4  11.0 - 15.0 % Final  . Platelets 05/30/2018 201  140 - 400 Thousand/uL Final  . MPV 05/30/2018 10.4  7.5 - 12.5 fL Final  . Neutro Abs 05/30/2018 2,083  1,500 - 7,800 cells/uL Final  . Lymphs Abs 05/30/2018 1,267  850 - 3,900 cells/uL Final  . Absolute Monocytes 05/30/2018 435  200 - 950 cells/uL Final  . Eosinophils Absolute 05/30/2018 275  15 - 500 cells/uL Final  . Basophils Absolute 05/30/2018 41  0 - 200 cells/uL Final   . Neutrophils Relative % 05/30/2018 50.8  % Final  . Total Lymphocyte 05/30/2018 30.9  % Final  . Monocytes Relative 05/30/2018 10.6  % Final  . Eosinophils Relative 05/30/2018 6.7  % Final  . Basophils Relative 05/30/2018 1.0  % Final  . Glucose, Bld 05/30/2018 92  65 - 99 mg/dL Final   Comment: .            Fasting reference interval .   . BUN 05/30/2018 21  7 - 25 mg/dL Final  . Creat 05/30/2018 0.73  0.70 - 1.33 mg/dL Final   Comment: For patients >65 years of age, the reference limit for Creatinine is approximately 13% higher for people identified as African-American. .   Havery Moros Ratio 23/53/6144 NOT APPLICABLE  6 - 22 (calc) Final  . Sodium 05/30/2018 141  135 - 146 mmol/L Final  . Potassium 05/30/2018 4.7  3.5 - 5.3 mmol/L Final  . Chloride 05/30/2018 106  98 - 110 mmol/L Final  . CO2 05/30/2018 28  20 - 32 mmol/L Final  . Calcium 05/30/2018 9.4  8.6 - 10.3 mg/dL Final  . Total Protein 05/30/2018 6.5  6.1 - 8.1 g/dL Final  . Albumin 05/30/2018 4.6  3.6 - 5.1 g/dL Final  .  Globulin 05/30/2018 1.9  1.9 - 3.7 g/dL (calc) Final  . AG Ratio 05/30/2018 2.4  1.0 - 2.5 (calc) Final  . Total Bilirubin 05/30/2018 1.7* 0.2 - 1.2 mg/dL Final  . Alkaline phosphatase (APISO) 05/30/2018 50  40 - 115 U/L Final  . AST 05/30/2018 21  10 - 35 U/L Final  . ALT 05/30/2018 16  9 - 46 U/L Final  . Cholesterol 05/30/2018 183  <200 mg/dL Final  . HDL 05/30/2018 53  >40 mg/dL Final  . Triglycerides 05/30/2018 51  <150 mg/dL Final  . LDL Cholesterol (Calc) 05/30/2018 116* mg/dL (calc) Final   Comment: Reference range: <100 . Desirable range <100 mg/dL for primary prevention;   <70 mg/dL for patients with CHD or diabetic patients  with > or = 2 CHD risk factors. Marland Kitchen LDL-C is now calculated using the Martin-Hopkins  calculation, which is a validated novel method providing  better accuracy than the Friedewald equation in the  estimation of LDL-C.  Cresenciano Genre et al. Annamaria Helling. 5732;202(54):  2061-2068  (http://education.[facility].com/faq/FAQ164)   . Total CHOL/HDL Ratio 05/30/2018 3.5  <5.0 (calc) Final  . Non-HDL Cholesterol (Calc) 05/30/2018 130* <130 mg/dL (calc) Final   Comment: For patients with diabetes plus 1 major ASCVD risk  factor, treating to a non-HDL-C goal of <100 mg/dL  (LDL-C of <70 mg/dL) is considered a therapeutic  option.   Marland Kitchen PSA 05/30/2018 2.0  < OR = 4.0 ng/mL Final   Comment: The total PSA value from this assay system is  standardized against the WHO standard. The test  result will be approximately 20% lower when compared  to the equimolar-standardized total PSA (Beckman  Coulter). Comparison of serial PSA results should be  interpreted with this fact in mind. . This test was performed using the Siemens  chemiluminescent method. Values obtained from  different assay methods cannot be used interchangeably. PSA levels, regardless of value, should not be interpreted as absolute evidence of the presence or absence of disease.    Past Medical History:  Diagnosis Date  . Hx of adenomatous polyp of colon 07/08/2014  . Hyperlipidemia   . Low back pain    Past Surgical History:  Procedure Laterality Date  . COLONOSCOPY  09-17-2003   normal dr Wynetta Emery  . HERNIA REPAIR     Current Outpatient Medications on File Prior to Visit  Medication Sig Dispense Refill  . aspirin 81 MG tablet Take 81 mg by mouth daily.    Marland Kitchen atorvastatin (LIPITOR) 10 MG tablet Take 1 tablet (10 mg total) by mouth daily. Please cancel simvastatin prescription 90 tablet 3  . Multiple Vitamin (MULTIVITAMIN) tablet Take 1 tablet by mouth daily.    . Omega-3 Fatty Acids (FISH OIL CONCENTRATE PO) Take by mouth.    Marland Kitchen tiZANidine (ZANAFLEX) 4 MG tablet Take 1 tablet (4 mg total) by mouth every 6 (six) hours as needed for muscle spasms. 60 tablet 1   No current facility-administered medications on file prior to visit.    Allergies  Allergen Reactions  . Niaspan [Niacin Er]      Severe flushing   Social History   Socioeconomic History  . Marital status: Married    Spouse name: Not on file  . Number of children: Not on file  . Years of education: Not on file  . Highest education level: Not on file  Occupational History  . Not on file  Social Needs  . Financial resource strain: Not on file  . Food insecurity:  Worry: Not on file    Inability: Not on file  . Transportation needs:    Medical: Not on file    Non-medical: Not on file  Tobacco Use  . Smoking status: Never Smoker  . Smokeless tobacco: Never Used  Substance and Sexual Activity  . Alcohol use: No    Alcohol/week: 0.0 standard drinks  . Drug use: No  . Sexual activity: Yes    Comment: married.  Works in Therapist, occupational, Psychologist, educational  Lifestyle  . Physical activity:    Days per week: Not on file    Minutes per session: Not on file  . Stress: Not on file  Relationships  . Social connections:    Talks on phone: Not on file    Gets together: Not on file    Attends religious service: Not on file    Active member of club or organization: Not on file    Attends meetings of clubs or organizations: Not on file    Relationship status: Not on file  . Intimate partner violence:    Fear of current or ex partner: Not on file    Emotionally abused: Not on file    Physically abused: Not on file    Forced sexual activity: Not on file  Other Topics Concern  . Not on file  Social History Narrative  . Not on file   Family History  Problem Relation Age of Onset  . Alcohol abuse Brother   . Hyperlipidemia Brother   . Diabetes Father   . Colon cancer Maternal Grandmother   . Esophageal cancer Neg Hx   . Rectal cancer Neg Hx   . Stomach cancer Neg Hx       Review of Systems  All other systems reviewed and are negative.      Objective:   Physical Exam  Constitutional: He is oriented to person, place, and time. He appears well-developed and well-nourished. No distress.  HENT:  Head:  Normocephalic and atraumatic.  Right Ear: External ear normal.  Left Ear: External ear normal.  Nose: Nose normal.  Mouth/Throat: Oropharynx is clear and moist. No oropharyngeal exudate.  Eyes: Pupils are equal, round, and reactive to light. Conjunctivae and EOM are normal. Right eye exhibits no discharge. Left eye exhibits no discharge. No scleral icterus.  Neck: Normal range of motion. Neck supple. No JVD present. No tracheal deviation present. No thyromegaly present.  Cardiovascular: Normal rate, regular rhythm, normal heart sounds and intact distal pulses. Exam reveals no gallop and no friction rub.  No murmur heard. Pulmonary/Chest: Effort normal and breath sounds normal. No stridor. No respiratory distress. He has no wheezes. He has no rales. He exhibits no tenderness.  Abdominal: Soft. Bowel sounds are normal. He exhibits no distension and no mass. There is no abdominal tenderness. There is no rebound and no guarding.  Genitourinary:    Prostate, penis and rectum normal.   Musculoskeletal: Normal range of motion.        General: No tenderness or edema.  Lymphadenopathy:    He has no cervical adenopathy.  Neurological: He is alert and oriented to person, place, and time. He has normal reflexes. No cranial nerve deficit. He exhibits normal muscle tone. Coordination normal.  Skin: Skin is warm. No rash noted. He is not diaphoretic. No erythema. No pallor.  Psychiatric: He has a normal mood and affect. His behavior is normal. Judgment and thought content normal.  Vitals reviewed.         Assessment &  Plan:  General medical exam  Patient's physical exam today is completely normal.  His blood pressure is outstanding at 122/80.  His lab work is significant for an elevation in his bilirubin.  This is never been present however it is extremely mild at 1.7 and his alkaline phosphatase is normal.  There is no evidence of increased red blood cell turnover on his CBC.  Therefore I recommended  repeating his bilirubin in 2 weeks to see if this is an isolated lab error.  At that time we can get direct and indirect bilirubin as well to try to determine the source if persistently elevated.  His LDL cholesterol slightly high at 116 however his 10-year risk of cardiovascular disease is 4.9%.  Therefore I recommended not increasing his Lipitor at the present time.  He will continue to take aspirin and Lipitor.  His father recently had a TIA at age 88 and we determined that he would like to stay on a statin because of this.  The remainder of his lab work is excellent including his PSA.  Regular anticipatory guidance is provided.  We will try meloxicam 15 mg p.o. daily as needed joint stiffness.  I warned him not to take it every day due to the risk of GI toxicity

## 2018-06-17 ENCOUNTER — Other Ambulatory Visit: Payer: 59

## 2018-06-19 ENCOUNTER — Other Ambulatory Visit: Payer: 59

## 2018-06-19 DIAGNOSIS — R17 Unspecified jaundice: Secondary | ICD-10-CM

## 2018-06-20 LAB — BILIRUBIN, FRACTIONATED(TOT/DIR/INDIR)
BILIRUBIN DIRECT: 0.2 mg/dL (ref 0.0–0.2)
BILIRUBIN TOTAL: 0.8 mg/dL (ref 0.2–1.2)
Indirect Bilirubin: 0.6 mg/dL (calc) (ref 0.2–1.2)

## 2018-06-23 MED FILL — ATORVASTATIN 10 MG TABLET: 10 | 90 days supply | Qty: 90 | Fill #1 | Status: TO

## 2018-07-01 ENCOUNTER — Encounter: Payer: Self-pay | Admitting: Family Medicine

## 2018-07-24 MED FILL — tiZANidine HCL 4 MG TABS: 4 | 15 days supply | Qty: 60 | Fill #1

## 2018-07-28 DIAGNOSIS — M545 Low back pain: Secondary | ICD-10-CM | POA: Diagnosis not present

## 2018-07-28 DIAGNOSIS — S33101A Dislocation of unspecified lumbar vertebra, initial encounter: Secondary | ICD-10-CM | POA: Diagnosis not present

## 2018-08-01 DIAGNOSIS — S33101A Dislocation of unspecified lumbar vertebra, initial encounter: Secondary | ICD-10-CM | POA: Diagnosis not present

## 2018-08-01 DIAGNOSIS — M545 Low back pain: Secondary | ICD-10-CM | POA: Diagnosis not present

## 2018-09-15 MED FILL — ATORVASTATIN 10 MG TABLET: 10 | 90 days supply | Qty: 90 | Fill #0

## 2018-10-30 DIAGNOSIS — S33101A Dislocation of unspecified lumbar vertebra, initial encounter: Secondary | ICD-10-CM | POA: Diagnosis not present

## 2018-10-30 DIAGNOSIS — M545 Low back pain: Secondary | ICD-10-CM | POA: Diagnosis not present

## 2018-11-11 ENCOUNTER — Ambulatory Visit (INDEPENDENT_AMBULATORY_CARE_PROVIDER_SITE_OTHER): Payer: 59 | Admitting: Family Medicine

## 2018-11-11 ENCOUNTER — Other Ambulatory Visit: Payer: Self-pay

## 2018-11-11 VITALS — BP 110/68 | HR 67 | Temp 97.7°F | Resp 18 | Ht 73.0 in | Wt 211.0 lb

## 2018-11-11 DIAGNOSIS — Q5522 Retractile testis: Secondary | ICD-10-CM | POA: Diagnosis not present

## 2018-11-11 NOTE — Progress Notes (Signed)
Subjective:    [name], male    DOB: 1960/08/09, 58 y.o.   MRN: [id number]  HPI Patient states that for the last 2 months, it feels like his left testicle is "drawing up inside him".  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  I am unable to manually press the testicle into the scrotum but it is palpable in the distal portion of the inguinal canal.  There is no palpable mass or abnormality.  It is tender to palpation.  There is no palpable hernia or mass that I can appreciate on the left side of the road him.  His right testicle is in the scrotum with a large cystocele that has been present for many years per the patient Past Medical History:  Diagnosis Date  . Hx of adenomatous polyp of colon 07/08/2014  . Hyperlipidemia   . Low back pain    Past Surgical History:  Procedure Laterality Date  . COLONOSCOPY  09-17-2003   normal dr Wynetta Emery  . HERNIA REPAIR     Current Outpatient Medications on File Prior to Visit  Medication Sig Dispense Refill  . aspirin 81 MG tablet Take 81 mg by mouth daily.    Marland Kitchen atorvastatin (LIPITOR) 10 MG tablet Take 1 tablet (10 mg total) by mouth daily. Please cancel simvastatin prescription 90 tablet 3  . meloxicam (MOBIC) 15 MG tablet Take 1 tablet (15 mg total) by mouth daily. 30 tablet 3  . Multiple Vitamin (MULTIVITAMIN) tablet Take 1 tablet by mouth daily.    . Omega-3 Fatty Acids (FISH OIL CONCENTRATE PO) Take by mouth.    Marland Kitchen tiZANidine (ZANAFLEX) 4 MG tablet Take 1 tablet (4 mg total) by mouth every 6 (six) hours as needed for muscle spasms. 60 tablet 1   No current facility-administered medications on file prior to visit.    Allergies  Allergen Reactions  . Niaspan [Niacin Er]     Severe flushing   Social History   Socioeconomic History  . Marital status: Married    Spouse name: Not on file  . Number of children: Not on file  . Years of education: Not on file  . Highest education level: Not on file  Occupational  History  . Not on file  Social Needs  . Financial resource strain: Not on file  . Food insecurity    Worry: Not on file    Inability: Not on file  . Transportation needs    Medical: Not on file    Non-medical: Not on file  Tobacco Use  . Smoking status: Never Smoker  . Smokeless tobacco: Never Used  Substance and Sexual Activity  . Alcohol use: No    Alcohol/week: 0.0 standard drinks  . Drug use: No  . Sexual activity: Yes    Comment: married.  Works in Therapist, occupational, Psychologist, educational  Lifestyle  . Physical activity    Days per week: Not on file    Minutes per session: Not on file  . Stress: Not on file  Relationships  . Social Herbalist on phone: Not on file    Gets together: Not on file    Attends religious service: Not on file    Active member of club or organization: Not on file    Attends meetings of clubs or organizations: Not on file    Relationship status: Not on file  . Intimate partner violence    Fear of current or ex  partner: Not on file    Emotionally abused: Not on file    Physically abused: Not on file    Forced sexual activity: Not on file  Other Topics Concern  . Not on file  Social History Narrative  . Not on file      Review of Systems  All other systems reviewed and are negative.      Objective:   Physical Exam  Neck: Neck supple.  Cardiovascular: Normal rate, regular rhythm and normal heart sounds.  Pulmonary/Chest: Effort normal and breath sounds normal.  Abdominal: Soft. Bowel sounds are normal. He exhibits no distension. There is no abdominal tenderness. There is no rebound and no guarding. Hernia confirmed negative in the right inguinal area and confirmed negative in the left inguinal area.  Genitourinary:    Penis normal.  Right testis shows mass and swelling. Right testis shows no tenderness. Right testis is descended. Cremasteric reflex is not absent on the right side. Left testis shows tenderness. Left testis shows no mass  and no swelling. Left testis is undescended.  Vitals reviewed.         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 I do not appreciate any hernia today on exam.  However his left testicle is retracted into the distal portion of the inguinal canal and I am unable to manually express it into the scrotum.  Is also tender to palpation.  I will begin with a scrotal ultrasound to evaluate further.  I am concerned that the retraction has just recently happened.  I am not certain of what may have caused this.  I do not appreciate any hernia but I do appreciate a cystocele on the right side.  We will evaluate further with a scrotal ultrasound

## 2018-11-13 ENCOUNTER — Encounter: Payer: Self-pay | Admitting: Family Medicine

## 2018-11-18 ENCOUNTER — Other Ambulatory Visit: Payer: BLUE CROSS/BLUE SHIELD

## 2018-11-26 ENCOUNTER — Other Ambulatory Visit: Payer: BLUE CROSS/BLUE SHIELD

## 2018-12-04 ENCOUNTER — Ambulatory Visit
Admission: RE | Admit: 2018-12-04 | Discharge: 2018-12-04 | Disposition: A | Discharge status: Home / Self Care | Payer: 59 | Source: Ambulatory Visit | Attending: Family Medicine | Admitting: Family Medicine

## 2018-12-04 DIAGNOSIS — Q5522 Retractile testis: Secondary | ICD-10-CM

## 2018-12-04 DIAGNOSIS — N433 Hydrocele, unspecified: Secondary | ICD-10-CM | POA: Diagnosis not present

## 2018-12-12 MED FILL — ATORVASTATIN 10 MG TABLET: 10 | 90 days supply | Qty: 90 | Fill #1

## 2019-02-03 ENCOUNTER — Other Ambulatory Visit: Payer: Self-pay

## 2019-02-03 ENCOUNTER — Ambulatory Visit (INDEPENDENT_AMBULATORY_CARE_PROVIDER_SITE_OTHER): Payer: 59

## 2019-02-03 DIAGNOSIS — Z23 Encounter for immunization: Secondary | ICD-10-CM | POA: Diagnosis not present

## 2019-02-26 DIAGNOSIS — H5203 Hypermetropia, bilateral: Secondary | ICD-10-CM | POA: Diagnosis not present

## 2019-02-26 DIAGNOSIS — H35711 Central serous chorioretinopathy, right eye: Secondary | ICD-10-CM | POA: Diagnosis not present

## 2019-02-26 DIAGNOSIS — H524 Presbyopia: Secondary | ICD-10-CM | POA: Diagnosis not present

## 2019-03-04 ENCOUNTER — Other Ambulatory Visit: Payer: Self-pay | Admitting: Family Medicine

## 2019-03-04 MED FILL — ATORVASTATIN 10 MG TABLET: 10 | 90 days supply | Qty: 90 | Fill #0

## 2019-05-08 ENCOUNTER — Other Ambulatory Visit: Payer: Self-pay | Admitting: Family Medicine

## 2019-05-08 ENCOUNTER — Encounter: Payer: Self-pay | Admitting: Family Medicine

## 2019-05-08 DIAGNOSIS — N5082 Scrotal pain: Secondary | ICD-10-CM

## 2019-05-25 ENCOUNTER — Encounter: Payer: Self-pay | Admitting: Family Medicine

## 2019-05-27 MED FILL — ATORVASTATIN 10 MG TABLET: 10 | 90 days supply | Qty: 90 | Fill #1

## 2019-06-02 ENCOUNTER — Ambulatory Visit
Admission: RE | Admit: 2019-06-02 | Discharge: 2019-06-02 | Disposition: A | Discharge status: Home / Self Care | Payer: No Typology Code available for payment source | Source: Ambulatory Visit | Attending: Chiropractor | Admitting: Chiropractor

## 2019-06-02 ENCOUNTER — Other Ambulatory Visit: Payer: Self-pay | Admitting: Chiropractor

## 2019-06-02 DIAGNOSIS — M545 Low back pain, unspecified: Secondary | ICD-10-CM

## 2019-07-13 ENCOUNTER — Other Ambulatory Visit: Payer: No Typology Code available for payment source

## 2019-07-13 DIAGNOSIS — Z125 Encounter for screening for malignant neoplasm of prostate: Secondary | ICD-10-CM

## 2019-07-13 DIAGNOSIS — E7800 Pure hypercholesterolemia, unspecified: Secondary | ICD-10-CM

## 2019-07-13 DIAGNOSIS — Z0000 Encounter for general adult medical examination without abnormal findings: Secondary | ICD-10-CM

## 2019-07-14 LAB — COMPLETE METABOLIC PANEL WITH GFR
AG Ratio: 2.2 (calc) (ref 1.0–2.5)
ALT: 14 U/L (ref 9–46)
AST: 16 U/L (ref 10–35)
Albumin: 4.2 g/dL (ref 3.6–5.1)
Alkaline phosphatase (APISO): 54 U/L (ref 35–144)
BUN: 18 mg/dL (ref 7–25)
CO2: 27 mmol/L (ref 20–32)
Calcium: 9.2 mg/dL (ref 8.6–10.3)
Chloride: 107 mmol/L (ref 98–110)
Creat: 0.71 mg/dL (ref 0.70–1.33)
GFR, Est African American: 120 mL/min/{1.73_m2} (ref >=60)
GFR, Est Non African American: 103 mL/min/{1.73_m2} (ref >=60)
Globulin: 1.9 g/dL (calc) (ref 1.9–3.7)
Glucose, Bld: 99 mg/dL (ref 65–99)
Potassium: 4.9 mmol/L (ref 3.5–5.3)
Sodium: 142 mmol/L (ref 135–146)
Total Bilirubin: 0.8 mg/dL (ref 0.2–1.2)
Total Protein: 6.1 g/dL (ref 6.1–8.1)

## 2019-07-14 LAB — CBC WITH DIFFERENTIAL/PLATELET
Absolute Monocytes: 409 cells/uL (ref 200–950)
Basophils Absolute: 52 cells/uL (ref 0–200)
Basophils Relative: 1.1 %
Eosinophils Absolute: 277 cells/uL (ref 15–500)
Eosinophils Relative: 5.9 %
HCT: 43.5 % (ref 38.5–50.0)
Hemoglobin: 14.6 g/dL (ref 13.2–17.1)
Lymphs Abs: 1330 cells/uL (ref 850–3900)
MCH: 30.9 pg (ref 27.0–33.0)
MCHC: 33.6 g/dL (ref 32.0–36.0)
MCV: 92.2 fL (ref 80.0–100.0)
MPV: 10.3 fL (ref 7.5–12.5)
Monocytes Relative: 8.7 %
Neutro Abs: 2632 cells/uL (ref 1500–7800)
Neutrophils Relative %: 56 %
Platelets: 192 10*3/uL (ref 140–400)
RBC: 4.72 10*6/uL (ref 4.20–5.80)
RDW: 12.6 % (ref 11.0–15.0)
Total Lymphocyte: 28.3 %
WBC: 4.7 10*3/uL (ref 3.8–10.8)

## 2019-07-14 LAB — LIPID PANEL
Cholesterol: 164 mg/dL (ref <200)
HDL: 44 mg/dL (ref >=40)
LDL Cholesterol (Calc): 104 mg/dL (calc) — ABNORMAL HIGH
Non-HDL Cholesterol (Calc): 120 mg/dL (calc) (ref <130)
Total CHOL/HDL Ratio: 3.7 (calc) (ref <5.0)
Triglycerides: 75 mg/dL (ref <150)

## 2019-07-14 LAB — PSA: PSA: 2.4 ng/mL (ref <=4.0)

## 2019-07-17 ENCOUNTER — Ambulatory Visit (INDEPENDENT_AMBULATORY_CARE_PROVIDER_SITE_OTHER): Payer: No Typology Code available for payment source | Admitting: Family Medicine

## 2019-07-17 ENCOUNTER — Other Ambulatory Visit: Payer: Self-pay

## 2019-07-17 ENCOUNTER — Encounter: Payer: Self-pay | Admitting: Family Medicine

## 2019-07-17 VITALS — BP 120/70 | HR 78 | Temp 98.1°F | Resp 16 | Ht 73.0 in | Wt 213.0 lb

## 2019-07-17 DIAGNOSIS — Z86010 Personal history of colonic polyps: Secondary | ICD-10-CM | POA: Diagnosis not present

## 2019-07-17 DIAGNOSIS — E7800 Pure hypercholesterolemia, unspecified: Secondary | ICD-10-CM | POA: Diagnosis not present

## 2019-07-17 DIAGNOSIS — Z0000 Encounter for general adult medical examination without abnormal findings: Secondary | ICD-10-CM | POA: Diagnosis not present

## 2019-07-17 DIAGNOSIS — M2550 Pain in unspecified joint: Secondary | ICD-10-CM | POA: Diagnosis not present

## 2019-07-17 MED ORDER — METAXALONE 800 MG PO TABS: 800.0000 mg | ORAL_TABLET | Freq: Three times a day (TID) | ORAL | 1 refills | Status: DC

## 2019-07-17 MED FILL — METAXALONE 800 MG TABLET: 800 | 30 days supply | Qty: 90 | Fill #0

## 2019-07-17 NOTE — Progress Notes (Signed)
Subjective:    [name], male    DOB: [birth year], 59 y.o.   MRN: [id number]  HPI Patient is here today for complete physical exam. Patient had a colonoscopy in February of 2016. He did have a tubular adenoma.  Therefore he is due for a colonoscopy this year.  He request that I go ahead and schedule this for him.  His PSA was 2.4.  Over the last 3 years it has fluctuated between 2.2 and 2.4.  Therefore remains remarkably stable.  He denies any lower urinary tract symptoms.  He is due for HIV screening as well as hepatitis C screening however he politely declines this due to a lack of risk factors.  He has 2 concerns today.  First he does report a rash on the anterior surfaces of both shins.  The rash is located over several varicose veins and appears to be an eczema-like rash.  Therefore I think it stasis dermatitis secondary to leg swelling from his varicose veins.  At the present time it is very mild and he denies requiring any treatment.  He is not wearing compression hose so I did suggest that he start wearing knee-high compression hose to prevent it from worsening.  He also complains of diffuse arthralgias and myalgias.  The patient reports that he hurts constantly in his lower back.  He would like to take Skelaxin for this as tizanidine causes fatigue.  He also complains of pain in both knees, both hips, and severe arthritis in the PIP and DIP joints on both hands.  In fact he is not able to fully flex his PIP joint of his right third finger.  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  Therefore I believe the majority of this is due to osteoarthritis and general wear and tear however he is taking a statin and he has not been screened for autoimmune disease. Lab on 07/13/2019  Component Date Value Ref Range Status  . PSA 07/13/2019 2.4  < OR = 4.0 ng/mL Final   Comment: The total PSA value from this assay system is  standardized against the WHO  standard. The test  result will be approximately 20% lower when compared  to the equimolar-standardized total PSA (Beckman  Coulter). Comparison of serial PSA results should be  interpreted with this fact in mind. . This test was performed using the Siemens  chemiluminescent method. Values obtained from  different assay methods cannot be used interchangeably. PSA levels, regardless of value, should not be interpreted as absolute evidence of the presence or absence of disease.   . Cholesterol 07/13/2019 164  <200 mg/dL Final  . HDL 07/13/2019 44  > OR = 40 mg/dL Final  . Triglycerides 07/13/2019 75  <150 mg/dL Final  . LDL Cholesterol (Calc) 07/13/2019 104* mg/dL (calc) Final   Comment: Reference range: <100 . Desirable range <100 mg/dL for primary prevention;   <70 mg/dL for patients with CHD or diabetic patients  with > or = 2 CHD risk factors. Marland Kitchen LDL-C is now calculated using the Martin-Hopkins  calculation, which is a validated novel method providing  better accuracy than the Friedewald equation in the  estimation of LDL-C.  Cresenciano Genre et al. Annamaria Helling. [id number]): 2061-2068  (http://education.[facility].com/faq/FAQ164)   . Total CHOL/HDL Ratio 07/13/2019 3.7  <5.0 (calc) Final  . Non-HDL Cholesterol (Calc) 07/13/2019 120  <130 mg/dL (calc) Final   Comment: For patients with diabetes plus 1 major ASCVD risk  factor, treating to a non-HDL-C goal of <100 mg/dL  (LDL-C of <70 mg/dL) is considered a therapeutic  option.   . Glucose, Bld 07/13/2019 99  65 - 99 mg/dL Final   Comment: .            Fasting reference interval .   . BUN 07/13/2019 18  7 - 25 mg/dL Final  . Creat 07/13/2019 0.71  0.70 - 1.33 mg/dL Final   Comment: For patients >54 years of age, the reference limit for Creatinine is approximately 13% higher for people identified as African-American. .   . GFR, Est Non African American 07/13/2019 103  > OR = 60 mL/min/1.52m2 Final  . GFR, Est African American  07/13/2019 120  > OR = 60 mL/min/1.51m2 Final  . BUN/Creatinine Ratio [id number] NOT APPLICABLE  6 - 22 (calc) Final  . Sodium 07/13/2019 142  135 - 146 mmol/L Final  . Potassium 07/13/2019 4.9  3.5 - 5.3 mmol/L Final  . Chloride 07/13/2019 107  98 - 110 mmol/L Final  . CO2 07/13/2019 27  20 - 32 mmol/L Final  . Calcium 07/13/2019 9.2  8.6 - 10.3 mg/dL Final  . Total Protein 07/13/2019 6.1  6.1 - 8.1 g/dL Final  . Albumin 07/13/2019 4.2  3.6 - 5.1 g/dL Final  . Globulin 07/13/2019 1.9  1.9 - 3.7 g/dL (calc) Final  . AG Ratio 07/13/2019 2.2  1.0 - 2.5 (calc) Final  . Total Bilirubin 07/13/2019 0.8  0.2 - 1.2 mg/dL Final  . Alkaline phosphatase (APISO) 07/13/2019 54  35 - 144 U/L Final  . AST 07/13/2019 16  10 - 35 U/L Final  . ALT 07/13/2019 14  9 - 46 U/L Final  . WBC 07/13/2019 4.7  3.8 - 10.8 Thousand/uL Final  . RBC 07/13/2019 4.72  4.20 - 5.80 Million/uL Final  . Hemoglobin 07/13/2019 14.6  13.2 - 17.1 g/dL Final  . HCT 07/13/2019 43.5  38.5 - 50.0 % Final  . MCV 07/13/2019 92.2  80.0 - 100.0 fL Final  . MCH 07/13/2019 30.9  27.0 - 33.0 pg Final  . MCHC 07/13/2019 33.6  32.0 - 36.0 g/dL Final  . RDW 07/13/2019 12.6  11.0 - 15.0 % Final  . Platelets 07/13/2019 192  140 - 400 Thousand/uL Final  . MPV 07/13/2019 10.3  7.5 - 12.5 fL Final  . Neutro Abs 07/13/2019 2,632  1,500 - 7,800 cells/uL Final  . Lymphs Abs 07/13/2019 1,330  850 - 3,900 cells/uL Final  . Absolute Monocytes 07/13/2019 409  200 - 950 cells/uL Final  . Eosinophils Absolute 07/13/2019 277  15 - 500 cells/uL Final  . Basophils Absolute 07/13/2019 52  0 - 200 cells/uL Final  . Neutrophils Relative % 07/13/2019 56  % Final  . Total Lymphocyte 07/13/2019 28.3  % Final  . Monocytes Relative 07/13/2019 8.7  % Final  . Eosinophils Relative 07/13/2019 5.9  % Final  . Basophils Relative 07/13/2019 1.1  % Final   Past Medical History:  Diagnosis Date  . Hx of adenomatous polyp of colon 07/08/2014  . Hyperlipidemia   .  Low back pain    Past Surgical History:  Procedure Laterality Date  . COLONOSCOPY  09-17-2003   normal dr Wynetta Emery  . HERNIA REPAIR     Current Outpatient Medications on File Prior to Visit  Medication Sig Dispense Refill  . aspirin 81 MG tablet Take 81 mg by mouth daily.    Marland Kitchen atorvastatin (LIPITOR) 10 MG tablet TAKE  1 TABLET (10 MG TOTAL) BY MOUTH DAILY 90 tablet 1  . meloxicam (MOBIC) 15 MG tablet Take 1 tablet (15 mg total) by mouth daily. 30 tablet 3  . Multiple Vitamin (MULTIVITAMIN) tablet Take 1 tablet by mouth daily.    . Omega-3 Fatty Acids (FISH OIL CONCENTRATE PO) Take by mouth.    Marland Kitchen tiZANidine (ZANAFLEX) 4 MG tablet Take 1 tablet (4 mg total) by mouth every 6 (six) hours as needed for muscle spasms. 60 tablet 1   No current facility-administered medications on file prior to visit.   Allergies  Allergen Reactions  . Niaspan [Niacin Er]     Severe flushing   Social History   Socioeconomic History  . Marital status: Married    Spouse name: Not on file  . Number of children: Not on file  . Years of education: Not on file  . Highest education level: Not on file  Occupational History  . Not on file  Tobacco Use  . Smoking status: Never Smoker  . Smokeless tobacco: Never Used  Substance and Sexual Activity  . Alcohol use: No    Alcohol/week: 0.0 standard drinks  . Drug use: No  . Sexual activity: Yes    Comment: married.  Works in Therapist, occupational, Psychologist, educational  Other Topics Concern  . Not on file  Social History Narrative  . Not on file   Social Determinants of Health   Financial Resource Strain:   . Difficulty of Paying Living Expenses: Not on file  Food Insecurity:   . Worried About Charity fundraiser in the Last Year: Not on file  . Ran Out of Food in the Last Year: Not on file  Transportation Needs:   . Lack of Transportation (Medical): Not on file  . Lack of Transportation (Non-Medical): Not on file  Physical Activity:   . Days of Exercise per Week:  Not on file  . Minutes of Exercise per Session: Not on file  Stress:   . Feeling of Stress : Not on file  Social Connections:   . Frequency of Communication with Friends and Family: Not on file  . Frequency of Social Gatherings with Friends and Family: Not on file  . Attends Religious Services: Not on file  . Active Member of Clubs or Organizations: Not on file  . Attends Archivist Meetings: Not on file  . Marital Status: Not on file  Intimate Partner Violence:   . Fear of Current or Ex-Partner: Not on file  . Emotionally Abused: Not on file  . Physically Abused: Not on file  . Sexually Abused: Not on file   Family History  Problem Relation Age of Onset  . Alcohol abuse Brother   . Hyperlipidemia Brother   . Diabetes Father   . Colon cancer Maternal Grandmother   . Esophageal cancer Neg Hx   . Rectal cancer Neg Hx   . Stomach cancer Neg Hx       Review of Systems  All other systems reviewed and are negative.      Objective:   Physical Exam  Constitutional: He is oriented to person, place, and time. He appears well-developed and well-nourished. No distress.  HENT:  Head: Normocephalic and atraumatic.  Right Ear: External ear normal.  Left Ear: External ear normal.  Nose: Nose normal.  Mouth/Throat: Oropharynx is clear and moist. No oropharyngeal exudate.  Eyes: Pupils are equal, round, and reactive to light. Conjunctivae and EOM are normal. Right eye exhibits no discharge.  Left eye exhibits no discharge. No scleral icterus.  Neck: No JVD present. No tracheal deviation present. No thyromegaly present.  Cardiovascular: Normal rate, regular rhythm, normal heart sounds and intact distal pulses. Exam reveals no gallop and no friction rub.  No murmur heard. Pulmonary/Chest: Effort normal and breath sounds normal. No stridor. No respiratory distress. He has no wheezes. He has no rales. He exhibits no tenderness.  Abdominal: Soft. Bowel sounds are normal. He  exhibits no distension and no mass. There is no abdominal tenderness. There is no rebound and no guarding.  Musculoskeletal:        General: No tenderness or edema. Normal range of motion.     Cervical back: Normal range of motion and neck supple.  Lymphadenopathy:    He has no cervical adenopathy.  Neurological: He is alert and oriented to person, place, and time. He has normal reflexes. No cranial nerve deficit. He exhibits normal muscle tone. Coordination normal.  Skin: Skin is warm. No rash noted. He is not diaphoretic. No erythema. No pallor.  Psychiatric: He has a normal mood and affect. His behavior is normal. Judgment and thought content normal.  Vitals reviewed.         Assessment & Plan:  Routine general medical examination at a health care facility  Pure hypercholesterolemia  Hx of adenomatous polyp of colon - Plan: Ambulatory referral to Gastroenterology  Polyarthralgia - Plan: CK, Rheumatoid factor, ANA, Sedimentation rate  Physical exam today is outstanding.  Blood pressure is excellent.  He declines HIV and hepatitis C screening.  PSA is stable.  I will schedule the patient for a colonoscopy.  I have requested that he discontinued his Lipitor for 2 to 3 weeks and see how much of his polyarthralgias and myalgias improved.  I will also screen the patient for autoimmune diseases by checking a CK level, rheumatoid factor, ANA, and sedimentation rate.  If myalgia improves dramatically off statin, we will likely not resume a statin in the future.  If they do not improve, the patient would like to switch to Crestor for cost savings.  I personally suspect osteoarthritis due to general wear and tear due to the physical demands of his occupation.  Schedule the patient for a colonoscopy.  Immunizations are up-to-date.

## 2019-07-19 LAB — ANA: Anti Nuclear Antibody (ANA): POSITIVE — AB

## 2019-07-19 LAB — SEDIMENTATION RATE: Sed Rate: 2 mm/h (ref 0–20)

## 2019-07-19 LAB — ANTI-NUCLEAR AB-TITER (ANA TITER): ANA Titer 1: 1:80 {titer} — ABNORMAL HIGH

## 2019-07-19 LAB — CK: Total CK: 181 U/L (ref 44–196)

## 2019-07-19 LAB — RHEUMATOID FACTOR: Rheumatoid fact SerPl-aCnc: <14 IU/mL (ref <14)

## 2019-08-01 ENCOUNTER — Ambulatory Visit: Payer: No Typology Code available for payment source | Attending: Internal Medicine

## 2019-08-01 DIAGNOSIS — Z23 Encounter for immunization: Secondary | ICD-10-CM

## 2019-08-01 NOTE — Progress Notes (Signed)
   Covid-19 Vaccination Clinic  Name:  Mike Perez    MRN: [id number] DOB: [birth year]  08/01/2019  Mr. [name] was observed post Covid-19 immunization for 15 minutes without incident. He was provided with Vaccine Information Sheet and instruction to access the V-Safe system.   Mr. [name] was instructed to call 911 with any severe reactions post vaccine: Marland Kitchen Difficulty breathing  . Swelling of face and throat  . A fast heartbeat  . A bad rash all over body  . Dizziness and weakness   Immunizations Administered    Name Date Dose VIS Date Route   Pfizer COVID-19 Vaccine 08/01/2019 10:01 AM 0.3 mL 05/01/2019 Intramuscular   Manufacturer: [place]   Lot: [id number]   [place]: [id number]

## 2019-08-08 ENCOUNTER — Encounter: Payer: Self-pay | Admitting: Family Medicine

## 2019-08-10 ENCOUNTER — Other Ambulatory Visit: Payer: Self-pay | Admitting: Family Medicine

## 2019-08-10 ENCOUNTER — Ambulatory Visit: Payer: No Typology Code available for payment source

## 2019-08-10 MED ORDER — ROSUVASTATIN CALCIUM 5 MG PO TABS: 5.0000 mg | ORAL_TABLET | Freq: Every day | ORAL | 3 refills | Status: DC

## 2019-08-11 MED FILL — ROSUVASTATIN CALCIUM 5 MG T: 5 | 90 days supply | Qty: 90 | Fill #0

## 2019-08-12 ENCOUNTER — Encounter: Payer: Self-pay | Admitting: Family Medicine

## 2019-08-16 ENCOUNTER — Encounter: Payer: Self-pay | Admitting: Family Medicine

## 2019-08-16 DIAGNOSIS — M25559 Pain in unspecified hip: Secondary | ICD-10-CM

## 2019-08-16 DIAGNOSIS — M25519 Pain in unspecified shoulder: Secondary | ICD-10-CM

## 2019-08-17 ENCOUNTER — Encounter: Payer: Self-pay | Admitting: Family Medicine

## 2019-08-18 ENCOUNTER — Encounter: Payer: Self-pay | Admitting: Family Medicine

## 2019-08-18 ENCOUNTER — Other Ambulatory Visit: Payer: Self-pay | Admitting: Family Medicine

## 2019-08-20 ENCOUNTER — Ambulatory Visit (INDEPENDENT_AMBULATORY_CARE_PROVIDER_SITE_OTHER): Payer: No Typology Code available for payment source | Admitting: Orthopaedic Surgery

## 2019-08-20 ENCOUNTER — Ambulatory Visit (INDEPENDENT_AMBULATORY_CARE_PROVIDER_SITE_OTHER): Payer: No Typology Code available for payment source

## 2019-08-20 ENCOUNTER — Other Ambulatory Visit: Payer: Self-pay

## 2019-08-20 ENCOUNTER — Encounter: Payer: Self-pay | Admitting: Physician Assistant

## 2019-08-20 DIAGNOSIS — G8929 Other chronic pain: Secondary | ICD-10-CM | POA: Diagnosis not present

## 2019-08-20 DIAGNOSIS — M25511 Pain in right shoulder: Secondary | ICD-10-CM

## 2019-08-20 DIAGNOSIS — M160 Bilateral primary osteoarthritis of hip: Secondary | ICD-10-CM

## 2019-08-20 MED ORDER — DICLOFENAC SODIUM 75 MG PO TBEC: 75.0000 mg | DELAYED_RELEASE_TABLET | Freq: Two times a day (BID) | ORAL | 2 refills | Status: DC

## 2019-08-20 MED ORDER — TRAMADOL HCL 50 MG PO TABS: 50.0000 mg | ORAL_TABLET | Freq: Three times a day (TID) | ORAL | 2 refills | Status: DC | PRN

## 2019-08-20 MED FILL — DICLOFENAC SODIUM 75 MG TAB: 75 | 30 days supply | Qty: 60 | Fill #0

## 2019-08-20 MED FILL — traMADol HCL 50 MG TABS: 50 | 10 days supply | Qty: 30 | Fill #0

## 2019-08-20 NOTE — Progress Notes (Addendum)
Office Visit Note   Patient: Mike Perez           Date of Birth: 1960-07-18           MRN: [id number] Visit Date: 08/20/2019              Requested by: Susy Frizzle, MD [address] [place],   [id number] PCP: Susy Frizzle, MD   Assessment & Plan: Visit Diagnoses:  1. Bilateral primary osteoarthritis of hip   2. Acute pain of right shoulder     Plan: Impression is bilateral hip osteoarthritis right greater than left and right shoulder rotator cuff tendinitis which is resolving.  In regards to the hips, I have recommended cortisone injection.  The patient notes he has a history of what sounds like possible retinal detachment and has been told by his ophthalmologist to not have any steroids to include steroid injections.  We will treat this without injections for now.  Have called in a prescription of tramadol to use as needed.  We have also discussed eventual treatment to include total hip arthroplasty.  In regards to the right shoulder, this has dramatically improved and there is no need for intervention at this time.  He will follow-up with [place] as needed for his shoulder.  Call with concerns or questions in meantime.  Follow-Up Instructions: Return if symptoms worsen or fail to improve.   Orders:  Orders Placed This Encounter  Procedures  . XR HIPS BILAT W OR W/O PELVIS 3-4 VIEWS  . XR Shoulder Right   Meds ordered this encounter  Medications  . traMADol (ULTRAM) 50 MG tablet    Sig: Take 1 tablet (50 mg total) by mouth 3 (three) times daily as needed.    Dispense:  30 tablet    Refill:  2  . diclofenac (VOLTAREN) 75 MG EC tablet    Sig: Take 1 tablet (75 mg total) by mouth 2 (two) times daily.    Dispense:  60 tablet    Refill:  2      Procedures: No procedures performed   Clinical Data: No additional findings.   Subjective: Chief Complaint  Patient presents with  . Left Hip - Pain  . Right Hip - Pain  . Right Shoulder - Pain     HPI patient is a very pleasant 59 year old gentleman who presents to our clinic today with bilateral hip pain right greater than left as well as right shoulder pain.  In regards to his hips they have been bothering him for the past year.  The pain he has is to the groin and anterior thigh.  He notes worsening symptoms when turning over in the bed as well as at the end of a long workday.  He has tried over-the-counter NSAIDs and Tylenol without significant relief of symptoms.  No previous injection or surgical intervention to either hip.  In regards to the right shoulder, he has had pain here for the past week.  He was carrying a light weight objects at work when he felt a pop to the proximal deltoid.  This was followed by pain and slight weakness for about a day.  His pain has dramatically improved over the past several days.  He notes that he no longer has any weakness to the right upper extremity.  He still has slight pain with the extremes of external rotation.  He denies any numbness, tingling or burning.  Review of Systems as detailed in  HPI.  All others reviewed and are negative.   Objective: Vital Signs: There were no vitals taken for this visit.  Physical Exam well-developed and well-nourished gentleman in no acute distress.  Alert and oriented x3.  Ortho Exam examination of his right hip reveals a markedly positive logroll and FADIR.  Left hip has a negative logroll and FADIR.  No focal weakness.  Is neurovascular intact distally.  Right shoulder exam shows near full active range of motion all planes.  Negative empty can, cross body abduction, O'Briens or speeds test.  He has full strength throughout.  He is neurovascularly intact distally.  Specialty Comments:  No specialty comments available.  Imaging: No results found.   PMFS History: Patient Active Problem List   Diagnosis Date Noted  . Hx of adenomatous polyp of colon 07/08/2014  . Prediabetes 04/07/2013  . Low back pain     Past Medical History:  Diagnosis Date  . Hx of adenomatous polyp of colon 07/08/2014  . Hyperlipidemia   . Low back pain     Family History  Problem Relation Age of Onset  . Alcohol abuse Brother   . Hyperlipidemia Brother   . Diabetes Father   . Colon cancer Maternal Grandmother   . Esophageal cancer Neg Hx   . Rectal cancer Neg Hx   . Stomach cancer Neg Hx     Past Surgical History:  Procedure Laterality Date  . COLONOSCOPY  09-17-2003   normal dr Wynetta Emery  . HERNIA REPAIR     Social History   Occupational History  . Not on file  Tobacco Use  . Smoking status: Never Smoker  . Smokeless tobacco: Never Used  Substance and Sexual Activity  . Alcohol use: No    Alcohol/week: 0.0 standard drinks  . Drug use: No  . Sexual activity: Yes    Comment: married.  Works in Therapist, occupational, Psychologist, educational

## 2019-08-24 ENCOUNTER — Ambulatory Visit: Payer: No Typology Code available for payment source | Attending: Internal Medicine

## 2019-08-24 DIAGNOSIS — Z23 Encounter for immunization: Secondary | ICD-10-CM

## 2019-08-24 NOTE — Progress Notes (Signed)
   Covid-19 Vaccination Clinic  Name:  Mike Perez    MRN: [id number] DOB: Oct 01, 1960  08/24/2019  [name] was observed post Covid-19 immunization for 15 minutes without incident. He was provided with Vaccine Information Sheet and instruction to access the V-Safe system.   [name] was instructed to call 911 with any severe reactions post vaccine: Marland Kitchen Difficulty breathing  . Swelling of face and throat  . A fast heartbeat  . A bad rash all over body  . Dizziness and weakness   Immunizations Administered    Name Date Dose VIS Date Route   Pfizer COVID-19 Vaccine 08/24/2019  3:52 PM 0.3 mL 05/01/2019 Intramuscular   Manufacturer: [employer]   Lot: [id number]   [place]: [id number]

## 2019-08-25 ENCOUNTER — Encounter: Payer: Self-pay | Admitting: Orthopaedic Surgery

## 2019-08-25 ENCOUNTER — Ambulatory Visit: Payer: No Typology Code available for payment source

## 2019-08-26 ENCOUNTER — Encounter: Payer: Self-pay | Admitting: Orthopaedic Surgery

## 2019-08-26 NOTE — Telephone Encounter (Signed)
Spoke to patient and he wanted me to call wife and she didn't answer so I left VM

## 2019-08-28 ENCOUNTER — Encounter: Payer: Self-pay | Admitting: Orthopaedic Surgery

## 2019-08-28 NOTE — Telephone Encounter (Signed)
Wife will call for an appt to discuss hip surgery and shoulder injection

## 2019-08-31 NOTE — Telephone Encounter (Signed)
Have you called to schedule?

## 2019-08-31 NOTE — Telephone Encounter (Signed)
Ok, cool...thanks

## 2019-09-15 ENCOUNTER — Encounter: Payer: Self-pay | Admitting: Orthopaedic Surgery

## 2019-09-15 ENCOUNTER — Ambulatory Visit: Payer: No Typology Code available for payment source | Admitting: Orthopaedic Surgery

## 2019-09-15 ENCOUNTER — Other Ambulatory Visit: Payer: Self-pay

## 2019-09-15 VITALS — Ht 73.0 in | Wt 215.0 lb

## 2019-09-15 DIAGNOSIS — M545 Low back pain, unspecified: Secondary | ICD-10-CM

## 2019-09-15 DIAGNOSIS — M25511 Pain in right shoulder: Secondary | ICD-10-CM | POA: Diagnosis not present

## 2019-09-15 DIAGNOSIS — G8929 Other chronic pain: Secondary | ICD-10-CM | POA: Diagnosis not present

## 2019-09-15 NOTE — Addendum Note (Signed)
Addended by: Precious Bard on: 09/15/2019 02:30 PM   Modules accepted: Orders

## 2019-09-15 NOTE — Progress Notes (Signed)
Office Visit Note   Patient: Mike Perez           Date of Birth: [birth year]           MRN: [id number] Visit Date: 09/15/2019              Requested by: Susy Frizzle, MD [address] [place],  [place] [address] PCP: Susy Frizzle, MD   Assessment & Plan: Visit Diagnoses:  1. Chronic bilateral low back pain without sciatica   2. Chronic right shoulder pain     Plan: Impression is right shoulder pain likely due to partial rotator cuff tear.  Function is relatively preserved.  We discussed multiple treatment options and patient would like to hold off on any injections until advanced imaging.  We will obtain MRI at this time.  Follow-up in about 2 weeks to review the MRI.  In terms of low back he has abnormally straight lumbar spine and he has lumbar spondylosis at multiple levels.  He is not describing any symptoms consistent with radiculopathy.  I recommend physical therapy and conservative treatment.  Questions encouraged and answered.  Follow-Up Instructions: Return in about 2 weeks (around 09/29/2019).   Orders:  No orders of the defined types were placed in this encounter.  No orders of the defined types were placed in this encounter.     Procedures: No procedures performed   Clinical Data: No additional findings.   Subjective: Chief Complaint  Patient presents with  . Right Shoulder - Follow-up  . Lower Back - Pain    Mike Perez is a 59 year old gentleman who is accompanied by his wife today.  He is complaining of low back pain and chronic right shoulder pain.  He states his low back pain started around Easter.  He recently had x-rays in January.  He has been taking muscle relaxers for the back pain..  Denies any bowel or bladder dysfunction or numbness and tingling or radicular symptoms.  Right shoulder pain is worse with elevation of the arm.   Review of Systems  Constitutional: Negative.   All other systems reviewed and are  negative.    Objective: Vital Signs: Ht 6\' 1"  (1.854 m)   Wt 215 lb (97.5 kg)   BMI 28.37 kg/m   Physical Exam Vitals and nursing note reviewed.  Constitutional:      Appearance: He is well-developed.  Pulmonary:     Effort: Pulmonary effort is normal.  Abdominal:     Palpations: Abdomen is soft.  Skin:    General: Skin is warm.  Neurological:     Mental Status: He is alert and oriented to person, place, and time.  Psychiatric:        Behavior: Behavior normal.        Thought Content: Thought content normal.        Judgment: Judgment normal.     Ortho Exam Right shoulder shows normal active and passive range of motion with mild pain with active range of motion.  Positive empty can test.  manual muscle testing shows slight weakness to empty can secondary to pain.  Negative impingement signs. Low back exam is nonfocal.  His low back pain that is nonradiating. Specialty Comments:  No specialty comments available.  Imaging: No results found.   PMFS History: Patient Active Problem List   Diagnosis Date Noted  . Hx of adenomatous polyp of colon 07/08/2014  . Prediabetes 04/07/2013  . Low back pain  Past Medical History:  Diagnosis Date  . Hx of adenomatous polyp of colon 07/08/2014  . Hyperlipidemia   . Low back pain     Family History  Problem Relation Age of Onset  . Alcohol abuse Brother   . Hyperlipidemia Brother   . Diabetes Father   . Colon cancer Maternal Grandmother   . Esophageal cancer Neg Hx   . Rectal cancer Neg Hx   . Stomach cancer Neg Hx     Past Surgical History:  Procedure Laterality Date  . COLONOSCOPY  09-17-2003   normal dr Wynetta Emery  . HERNIA REPAIR     Social History   Occupational History  . Not on file  Tobacco Use  . Smoking status: Never Smoker  . Smokeless tobacco: Never Used  Substance and Sexual Activity  . Alcohol use: No    Alcohol/week: 0.0 standard drinks  . Drug use: No  . Sexual activity: Yes    Comment:  married.  Works in Therapist, occupational, Psychologist, educational

## 2019-09-16 ENCOUNTER — Encounter: Payer: Self-pay | Admitting: Family Medicine

## 2019-09-17 ENCOUNTER — Other Ambulatory Visit: Payer: Self-pay | Admitting: Family Medicine

## 2019-09-17 DIAGNOSIS — M25559 Pain in unspecified hip: Secondary | ICD-10-CM

## 2019-09-17 DIAGNOSIS — M2550 Pain in unspecified joint: Secondary | ICD-10-CM

## 2019-09-21 ENCOUNTER — Other Ambulatory Visit: Payer: Self-pay | Admitting: Orthopaedic Surgery

## 2019-09-21 ENCOUNTER — Encounter: Payer: Self-pay | Admitting: Orthopaedic Surgery

## 2019-09-21 DIAGNOSIS — Z9889 Other specified postprocedural states: Secondary | ICD-10-CM

## 2019-09-29 ENCOUNTER — Ambulatory Visit: Payer: No Typology Code available for payment source | Admitting: Orthopaedic Surgery

## 2019-10-22 ENCOUNTER — Ambulatory Visit
Admission: RE | Admit: 2019-10-22 | Discharge: 2019-10-22 | Disposition: A | Discharge status: Home / Self Care | Payer: No Typology Code available for payment source | Source: Ambulatory Visit | Attending: Orthopaedic Surgery | Admitting: Orthopaedic Surgery

## 2019-10-22 ENCOUNTER — Other Ambulatory Visit: Payer: Self-pay

## 2019-10-22 DIAGNOSIS — G8929 Other chronic pain: Secondary | ICD-10-CM

## 2019-10-22 DIAGNOSIS — Z9889 Other specified postprocedural states: Secondary | ICD-10-CM

## 2019-10-27 ENCOUNTER — Ambulatory Visit: Payer: No Typology Code available for payment source | Admitting: Orthopaedic Surgery

## 2019-10-29 ENCOUNTER — Encounter: Payer: Self-pay | Admitting: Family Medicine

## 2019-10-30 ENCOUNTER — Other Ambulatory Visit: Payer: Self-pay

## 2019-10-30 ENCOUNTER — Ambulatory Visit (INDEPENDENT_AMBULATORY_CARE_PROVIDER_SITE_OTHER): Payer: No Typology Code available for payment source | Admitting: Orthopaedic Surgery

## 2019-10-30 DIAGNOSIS — M1612 Unilateral primary osteoarthritis, left hip: Secondary | ICD-10-CM | POA: Insufficient documentation

## 2019-10-30 DIAGNOSIS — M1611 Unilateral primary osteoarthritis, right hip: Secondary | ICD-10-CM | POA: Diagnosis not present

## 2019-10-30 DIAGNOSIS — M67921 Unspecified disorder of synovium and tendon, right upper arm: Secondary | ICD-10-CM | POA: Diagnosis not present

## 2019-10-30 DIAGNOSIS — M67819 Other specified disorders of synovium and tendon, unspecified shoulder: Secondary | ICD-10-CM | POA: Insufficient documentation

## 2019-10-30 NOTE — Progress Notes (Signed)
Office Visit Note   Patient: Mike Perez           Date of Birth: February 11, 1961           MRN: [id number] Visit Date: 10/30/2019              Requested by: Susy Frizzle, MD [address] [place],  [place] [address] PCP: Susy Frizzle, MD   Assessment & Plan: Visit Diagnoses:  1. Primary osteoarthritis of left hip   2. Primary osteoarthritis of right hip   3. Tendinosis of rotator cuff   4. Tendinopathy of right biceps tendon     Plan: My impression is severe right hip DJD, moderate left hip DJD, right shoulder biceps tendinopathy and rotator cuff tendinosis.  Right shoulder MRI shows medial subluxation of the biceps tendon with tendinosis of the subscapularis.  He has partial bursal surface tearing of the supraspinatus as well as AC joint arthrosis.  These findings were reviewed with the patient in detail.  Based on our discussion the patient feels that he can continue to endure his symptoms and is still functional therefore he will continue with his symptomatic treatment.  We will see him back as needed.  Follow-Up Instructions: Return if symptoms worsen or fail to improve.   Orders:  No orders of the defined types were placed in this encounter.  No orders of the defined types were placed in this encounter.     Procedures: No procedures performed   Clinical Data: No additional findings.   Subjective: Chief Complaint  Patient presents with  . Right Hip - Pain  . Left Hip - Pain  . Right Shoulder - Pain    Mike Perez returns today for MRI review of his right shoulder and follow-up for bilateral hip DJD.  He is accompanied by his wife.   Review of Systems  Constitutional: Negative.   All other systems reviewed and are negative.    Objective: Vital Signs: There were no vitals taken for this visit.  Physical Exam Vitals and nursing note reviewed.  Constitutional:      Appearance: He is well-developed.  Pulmonary:     Effort: Pulmonary  effort is normal.  Abdominal:     Palpations: Abdomen is soft.  Skin:    General: Skin is warm.  Neurological:     Mental Status: He is alert and oriented to person, place, and time.  Psychiatric:        Behavior: Behavior normal.        Thought Content: Thought content normal.        Judgment: Judgment normal.     Ortho Exam Hip exams are unchanged. Right shoulder exam shows tenderness of the bicipital groove.  Manual muscle testing is slightly decreased secondary to pain.  Positive cross body adduction. Specialty Comments:  No specialty comments available.  Imaging: No results found.   PMFS History: Patient Active Problem List   Diagnosis Date Noted  . Primary osteoarthritis of right hip 10/30/2019  . Primary osteoarthritis of left hip 10/30/2019  . Tendinosis of rotator cuff 10/30/2019  . Tendinopathy of right biceps tendon 10/30/2019  . Hx of adenomatous polyp of colon 07/08/2014  . Prediabetes 04/07/2013  . Low back pain    Past Medical History:  Diagnosis Date  . Hx of adenomatous polyp of colon 07/08/2014  . Hyperlipidemia   . Low back pain     Family History  Problem Relation Age of Onset  .  Alcohol abuse Brother   . Hyperlipidemia Brother   . Diabetes Father   . Colon cancer Maternal Grandmother   . Esophageal cancer Neg Hx   . Rectal cancer Neg Hx   . Stomach cancer Neg Hx     Past Surgical History:  Procedure Laterality Date  . COLONOSCOPY  09-17-2003   normal dr Wynetta Emery  . HERNIA REPAIR     Social History   Occupational History  . Not on file  Tobacco Use  . Smoking status: Never Smoker  . Smokeless tobacco: Never Used  Substance and Sexual Activity  . Alcohol use: No    Alcohol/week: 0.0 standard drinks  . Drug use: No  . Sexual activity: Yes    Comment: married.  Works in Therapist, occupational, Psychologist, educational

## 2019-11-10 ENCOUNTER — Encounter: Payer: Self-pay | Admitting: Family Medicine

## 2019-11-10 DIAGNOSIS — M67819 Other specified disorders of synovium and tendon, unspecified shoulder: Secondary | ICD-10-CM

## 2019-11-10 DIAGNOSIS — M25519 Pain in unspecified shoulder: Secondary | ICD-10-CM

## 2019-11-10 DIAGNOSIS — M67921 Unspecified disorder of synovium and tendon, right upper arm: Secondary | ICD-10-CM

## 2019-11-10 MED FILL — ROSUVASTATIN CALCIUM 5 MG T: 5 | 90 days supply | Qty: 90 | Fill #1

## 2019-11-24 ENCOUNTER — Encounter: Payer: Self-pay | Admitting: Family Medicine

## 2019-11-26 ENCOUNTER — Other Ambulatory Visit: Payer: Self-pay | Admitting: Family Medicine

## 2019-11-26 DIAGNOSIS — H6120 Impacted cerumen, unspecified ear: Secondary | ICD-10-CM

## 2019-12-21 ENCOUNTER — Other Ambulatory Visit: Payer: Self-pay | Admitting: Family Medicine

## 2019-12-21 DIAGNOSIS — M79673 Pain in unspecified foot: Secondary | ICD-10-CM

## 2020-01-05 ENCOUNTER — Ambulatory Visit: Payer: No Typology Code available for payment source | Admitting: Podiatry

## 2020-02-09 MED FILL — ROSUVASTATIN CALCIUM 5 MG T: 5 | 90 days supply | Qty: 90 | Fill #2

## 2020-02-15 ENCOUNTER — Encounter: Payer: Self-pay | Admitting: Family Medicine

## 2020-02-25 ENCOUNTER — Ambulatory Visit (INDEPENDENT_AMBULATORY_CARE_PROVIDER_SITE_OTHER): Payer: No Typology Code available for payment source

## 2020-02-25 ENCOUNTER — Other Ambulatory Visit: Payer: Self-pay

## 2020-02-25 DIAGNOSIS — Z23 Encounter for immunization: Secondary | ICD-10-CM

## 2020-03-05 ENCOUNTER — Ambulatory Visit: Payer: No Typology Code available for payment source

## 2020-03-19 ENCOUNTER — Ambulatory Visit: Payer: No Typology Code available for payment source | Attending: Internal Medicine

## 2020-03-19 DIAGNOSIS — Z23 Encounter for immunization: Secondary | ICD-10-CM

## 2020-03-19 NOTE — Progress Notes (Signed)
   Covid-19 Vaccination Clinic  Name:  Mike Perez    MRN: [id number] DOB: 1961-05-16  03/19/2020  [name] was observed post Covid-19 immunization for 15 minutes without incident. He was provided with Vaccine Information Sheet and instruction to access the V-Safe system.   [name] was instructed to call 911 with any severe reactions post vaccine: Marland Kitchen Difficulty breathing  . Swelling of face and throat  . A fast heartbeat  . A bad rash all over body  . Dizziness and weakness

## 2020-05-06 ENCOUNTER — Other Ambulatory Visit: Payer: Self-pay

## 2020-05-06 ENCOUNTER — Other Ambulatory Visit: Payer: No Typology Code available for payment source

## 2020-05-06 ENCOUNTER — Telehealth: Payer: No Typology Code available for payment source | Admitting: Nurse Practitioner

## 2020-05-06 DIAGNOSIS — Z20822 Contact with and (suspected) exposure to covid-19: Secondary | ICD-10-CM

## 2020-05-06 NOTE — Progress Notes (Signed)
E-Visit for Corona Virus Screening  Your current symptoms could be consistent with the coronavirus.  Many health care providers can now test patients at their office but not all are.  [facility] has multiple testing sites. For information on our COVID testing locations and hours go to [email or web address]  Once you have gotten your test results we can go from there.  Testing Information: The COVID-19 Community Testing sites are testing BY APPOINTMENT ONLY.  You can schedule online at [email or web address]  If you do not have access to a smart phone or computer you may call [phone] for an appointment.   Additional testing sites in the Community:  . For [facility] Testing sites in [facility]  FaceUpdate.[email or web address]  . For Pop-up testing sites in [place]  BowlDirectory.co.uk  . For [facility] [email or web address]  . For [facility] testing in [place] and [employer] [email or web address]  . For [facility] testing in [facility]   [email or web address]  For  more information about community testing call [phone]   Please quarantine yourself while awaiting your test results. Please stay home for a minimum of 10 days from the first day of illness with improving symptoms and you have had 24 hours of no fever (without the use of Tylenol (Acetaminophen) Motrin (Ibuprofen) or any fever reducing medication).  Also - Do not get tested prior to returning to work because once you have had a positive test the test can stay positive for more than a month in some cases.   You should wear a mask or cloth face covering over your nose and mouth if you must be around  other people or animals, including pets (even at home). Try to stay at least 6 feet away from other people. This will protect the people around you.  Please continue good preventive care measures, including:  frequent hand-washing, avoid touching your face, cover coughs/sneezes, stay out of crowds and keep a 6 foot distance from others.  COVID-19 is a respiratory illness with symptoms that are similar to the flu. Symptoms are typically mild to moderate, but there have been cases of severe illness and death due to the virus.   The following symptoms may appear 2-14 days after exposure: . Fever . Cough . Shortness of breath or difficulty breathing . Chills . Repeated shaking with chills . Muscle pain . Headache . Sore throat . New loss of taste or smell . Fatigue . Congestion or runny nose . Nausea or vomiting . Diarrhea  Go to the nearest hospital ED for assessment if fever/cough/breathlessness are severe or illness seems like a threat to life.  It is vitally important that if you feel that you have an infection such as this virus or any other virus that you stay home and away from places where you may spread it to others.  You should avoid contact with people age 55 and older.    You may also take acetaminophen (Tylenol) as needed for fever.  Reduce your risk of any infection by using the same precautions used for avoiding the common cold or flu:  Marland Kitchen Wash your hands often with soap and warm water for at least 20 seconds.  If soap and water are not readily available, use an alcohol-based hand sanitizer with at least 60% alcohol.  . If coughing or sneezing, cover your mouth and nose by coughing or sneezing into the elbow areas of your shirt or coat, into a tissue or into your sleeve (not your hands). . Avoid  shaking hands with others and consider head nods or verbal greetings only. . Avoid touching your eyes, nose, or mouth with unwashed hands.  . Avoid close contact with people who are  sick. . Avoid places or events with large numbers of people in one location, like concerts or sporting events. . Carefully consider travel plans you have or are making. . If you are planning any travel outside or inside the [place], visit the CDC's Travelers' Health webpage for the latest health notices. . If you have some symptoms but not all symptoms, continue to monitor at home and seek medical attention if your symptoms worsen. . If you are having a medical emergency, call 911.  HOME CARE . Only take medications as instructed by your medical team. . Drink plenty of fluids and get plenty of rest. . A steam or ultrasonic humidifier can help if you have congestion.   GET HELP RIGHT AWAY IF YOU HAVE EMERGENCY WARNING SIGNS** FOR COVID-19. If you or someone is showing any of these signs seek emergency medical care immediately. Call 911 or proceed to your closest emergency facility if: . You develop worsening high fever. . Trouble breathing . Bluish lips or face . Persistent pain or pressure in the chest . New confusion . Inability to wake or stay awake . You cough up blood. . Your symptoms become more severe  **This list is not all possible symptoms. Contact your medical provider for any symptoms that are sever or concerning to you.  MAKE SURE YOU   Understand these instructions.  Will watch your condition.  Will get help right away if you are not doing well or get worse.  Your e-visit answers were reviewed by a board certified advanced clinical practitioner to complete your personal care plan.  Depending on the condition, your plan could have included both over the counter or prescription medications.  If there is a problem please reply once you have received a response from your provider.  Your safety is important to [place].  If you have drug allergies check your prescription carefully.    You can use MyChart to ask questions about today's visit, request a non-urgent call back, or ask for  a work or school excuse for 24 hours related to this e-Visit. If it has been greater than 24 hours you will need to follow up with your provider, or enter a new e-Visit to address those concerns. You will get an e-mail in the next two days asking about your experience.  I hope that your e-visit has been valuable and will speed your recovery. Thank you for using e-visits.  5-10 minutes spent reviewing and documenting in chart.

## 2020-05-07 LAB — NOVEL CORONAVIRUS, NAA: SARS-CoV-2, NAA: NOT DETECTED

## 2020-05-07 LAB — SARS-COV-2, NAA 2 DAY TAT

## 2020-05-08 ENCOUNTER — Encounter: Payer: Self-pay | Admitting: Family Medicine

## 2020-05-09 ENCOUNTER — Ambulatory Visit (INDEPENDENT_AMBULATORY_CARE_PROVIDER_SITE_OTHER): Payer: No Typology Code available for payment source | Admitting: Family Medicine

## 2020-05-09 ENCOUNTER — Other Ambulatory Visit: Payer: Self-pay | Admitting: Family Medicine

## 2020-05-09 ENCOUNTER — Encounter: Payer: Self-pay | Admitting: Family Medicine

## 2020-05-09 ENCOUNTER — Other Ambulatory Visit: Payer: Self-pay

## 2020-05-09 VITALS — BP 120/80 | HR 72 | Temp 98.3°F | Ht 73.0 in | Wt 217.0 lb

## 2020-05-09 DIAGNOSIS — J029 Acute pharyngitis, unspecified: Secondary | ICD-10-CM | POA: Diagnosis not present

## 2020-05-09 MED ORDER — AMOXICILLIN 875 MG PO TABS: 875.0000 mg | ORAL_TABLET | Freq: Two times a day (BID) | ORAL | 0 refills | Status: DC

## 2020-05-09 MED FILL — ROSUVASTATIN CALCIUM 5 MG T: 5 | 90 days supply | Qty: 90 | Fill #3

## 2020-05-09 MED FILL — AMOXICILLIN 875 MG TABS: 875 | 10 days supply | Qty: 20 | Fill #0

## 2020-05-09 NOTE — Progress Notes (Signed)
Subjective:    [name], male    DOB: Feb 04, 1961, 59 y.o.   MRN: [id number]  HPI Thursday, the patient was directly exposed to Covid.  Friday he developed fever to 100.3, severe sore throat.  He does have some mild rhinorrhea.  He has no cough.  He went and got tested for Covid Friday night and his Covid test was negative.  However the sore throat has worsened through the weekend.  On examination his posterior oropharynx is swollen and erythematous and very tender to touch.  He reports that it feels like swallowing razor blades.  He denies any exposure to mono, hand-foot-and-mouth, strep to his knowledge Past Medical History:  Diagnosis Date  . Hx of adenomatous polyp of colon 07/08/2014  . Hyperlipidemia   . Low back pain    Past Surgical History:  Procedure Laterality Date  . COLONOSCOPY  09-17-2003   normal dr Wynetta Emery  . HERNIA REPAIR     Current Outpatient Medications on File Prior to Visit  Medication Sig Dispense Refill  . aspirin 81 MG tablet Take 81 mg by mouth daily.    . Calcium 250 MG CAPS     . Cholecalciferol (VITAMIN D) 125 MCG (5000 UT) CAPS     . diclofenac (VOLTAREN) 75 MG EC tablet Take 1 tablet (75 mg total) by mouth 2 (two) times daily. 60 tablet 2  . metaxalone (SKELAXIN) 800 MG tablet Take 1 tablet (800 mg total) by mouth 3 (three) times daily. 90 tablet 1  . Multiple Vitamin (MULTIVITAMIN) tablet Take 1 tablet by mouth daily.    . Omega-3 Fatty Acids (FISH OIL CONCENTRATE PO) Take by mouth.    . rosuvastatin (CRESTOR) 5 MG tablet Take 1 tablet (5 mg total) by mouth daily. 90 tablet 3  . traMADol (ULTRAM) 50 MG tablet Take 1 tablet (50 mg total) by mouth 3 (three) times daily as needed. 30 tablet 2   No current facility-administered medications on file prior to visit.   Allergies  Allergen Reactions  . Niaspan [Niacin Er]     Severe flushing   Social History   Socioeconomic History  . Marital status: Married    Spouse name: Not on file   . Number of children: Not on file  . Years of education: Not on file  . Highest education level: Not on file  Occupational History  . Not on file  Tobacco Use  . Smoking status: Never Smoker  . Smokeless tobacco: Never Used  Substance and Sexual Activity  . Alcohol use: No    Alcohol/week: 0.0 standard drinks  . Drug use: No  . Sexual activity: Yes    Comment: married.  Works in Therapist, occupational, Psychologist, educational  Other Topics Concern  . Not on file  Social History Narrative  . Not on file   Social Determinants of Health   Financial Resource Strain: Not on file  Food Insecurity: Not on file  Transportation Needs: Not on file  Physical Activity: Not on file  Stress: Not on file  Social Connections: Not on file  Intimate Partner Violence: Not on file      Review of Systems  All other systems reviewed and are negative.      Objective:   Physical Exam Vitals reviewed.  HENT:     Head: Normocephalic and atraumatic.     Jaw: There is normal jaw occlusion.     Salivary Glands: Right salivary gland is not diffusely enlarged or tender. Left  salivary gland is not diffusely enlarged or tender.     Right Ear: Hearing, tympanic membrane and ear canal normal.     Left Ear: Hearing, tympanic membrane and ear canal normal.     Nose:     Right Turbinates: Not swollen.     Left Turbinates: Not swollen.     Right Sinus: No maxillary sinus tenderness or frontal sinus tenderness.     Left Sinus: No maxillary sinus tenderness or frontal sinus tenderness.     Mouth/Throat:     Mouth: No oral lesions.     Pharynx: Pharyngeal swelling and posterior oropharyngeal erythema present. No oropharyngeal exudate.     Tonsils: No tonsillar exudate or tonsillar abscesses.  Cardiovascular:     Rate and Rhythm: Normal rate and regular rhythm.     Heart sounds: Normal heart sounds.  Pulmonary:     Effort: Pulmonary effort is normal.     Breath sounds: Normal breath sounds.  Abdominal:     General:  Bowel sounds are normal. There is no distension.     Palpations: Abdomen is soft.     Tenderness: There is no abdominal tenderness. There is no guarding or rebound.     Hernia: There is no hernia in the left inguinal area.  Musculoskeletal:     Cervical back: Neck supple.           Assessment & Plan:  Sore throat - Plan: COVID19 and Influenza A & B, STREP GROUP A AG, W/REFLEX TO CULT  The swelling and erythema is pronounced in the posterior oropharynx.  I did repeat a flu test and a Covid test today however I suspect some type of bacterial pharyngitis.  Strep test and culture was sent however I will empirically start to treat the patient with amoxicillin given the severity of the pain and swelling.  Herpangina from coxsackievirus is also on the differential diagnosis

## 2020-05-10 ENCOUNTER — Other Ambulatory Visit: Payer: Self-pay | Admitting: Family Medicine

## 2020-05-10 MED ORDER — HYDROCODONE-ACETAMINOPHEN 5-325 MG PO TABS: 1.0000 | ORAL_TABLET | Freq: Four times a day (QID) | ORAL | 0 refills | Status: DC | PRN

## 2020-05-10 MED ORDER — AMOXICILLIN 875 MG PO TABS: 875.0000 mg | ORAL_TABLET | Freq: Two times a day (BID) | ORAL | 0 refills | Status: DC

## 2020-05-10 MED ORDER — LIDOCAINE VISCOUS HCL 2 % MT SOLN: 15.0000 mL | OROMUCOSAL | 0 refills | Status: DC | PRN

## 2020-05-10 MED FILL — HYDROCODON-APAP 5-325: 5-325 | 3 days supply | Qty: 10 | Fill #0

## 2020-05-10 MED FILL — LIDOCAINE 2% VISCOUS SOLN: 2 | 2 days supply | Qty: 100 | Fill #0

## 2020-05-11 ENCOUNTER — Encounter: Payer: Self-pay | Admitting: Family Medicine

## 2020-05-11 LAB — CULTURE, GROUP A STREP
MICRO NUMBER:: 11337622
SPECIMEN QUALITY:: ADEQUATE

## 2020-05-11 LAB — STREP GROUP A AG, W/REFLEX TO CULT: Streptococcus Group A AG: NOT DETECTED

## 2020-05-12 LAB — SARS-COVID-2 RNA(COVID19)AND INFLUENZA A&B, QUALITATIVE NAAT
FLU A: NOT DETECTED
FLU B: NOT DETECTED
SARS CoV2 RNA: NOT DETECTED

## 2020-06-05 IMAGING — US ULTRASOUND OF SCROTUM
1 series · 14 of 25 positions shown · non-contrast
Comparison: None.

CLINICAL DATA: Right cystocele; left retracted testicle.

EXAM:
ULTRASOUND OF SCROTUM
TECHNIQUE: Complete ultrasound examination of the testicles, epididymis, and
other scrotal structures was performed.

[Series 1: ultrasound of scrotum · 0.06mm/px · 14 of 77 slices shown]
[im 1/77]
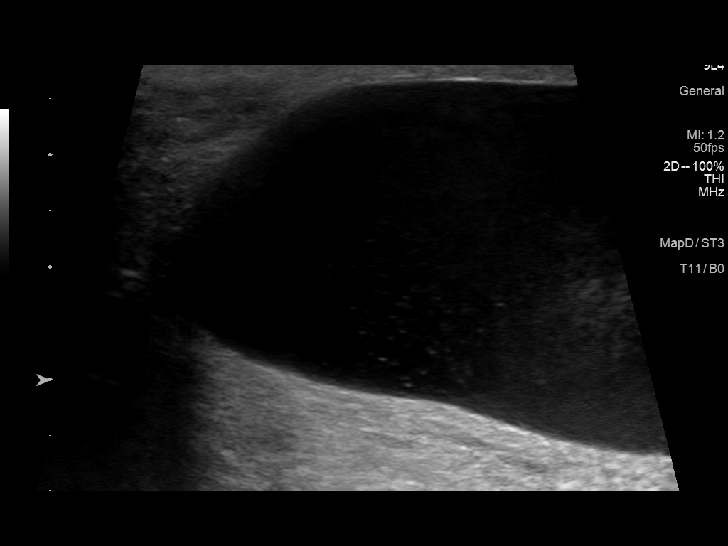
[im 7/77]
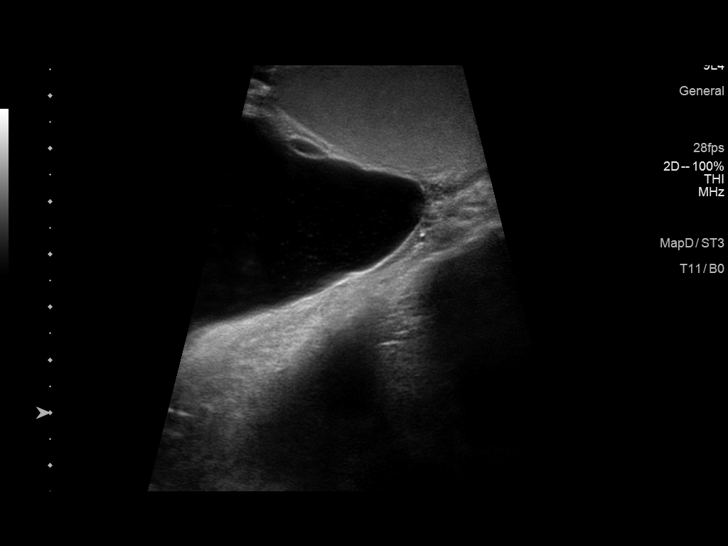
[im 13/77]
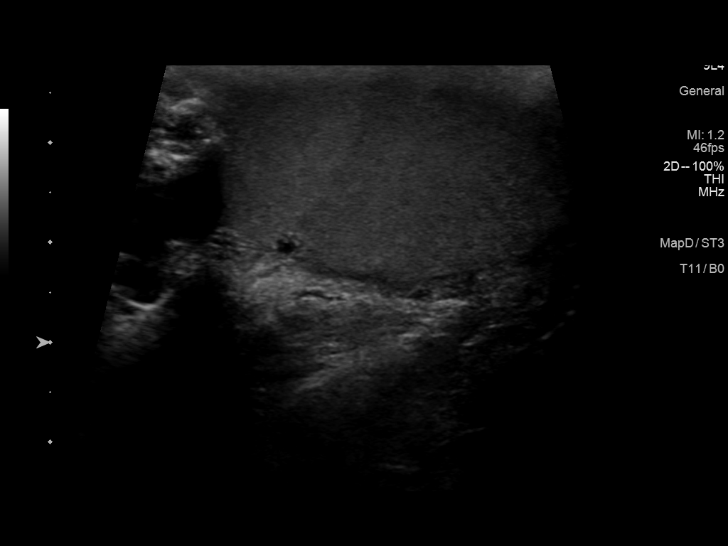
[im 20/77]
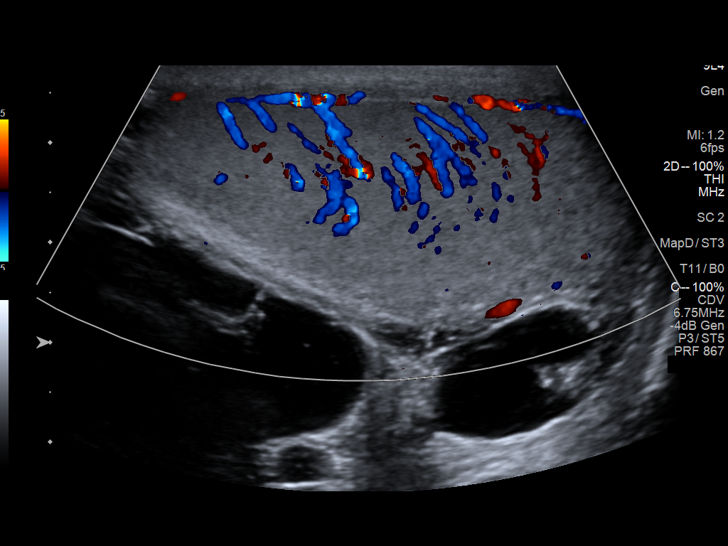
[im 26/77]
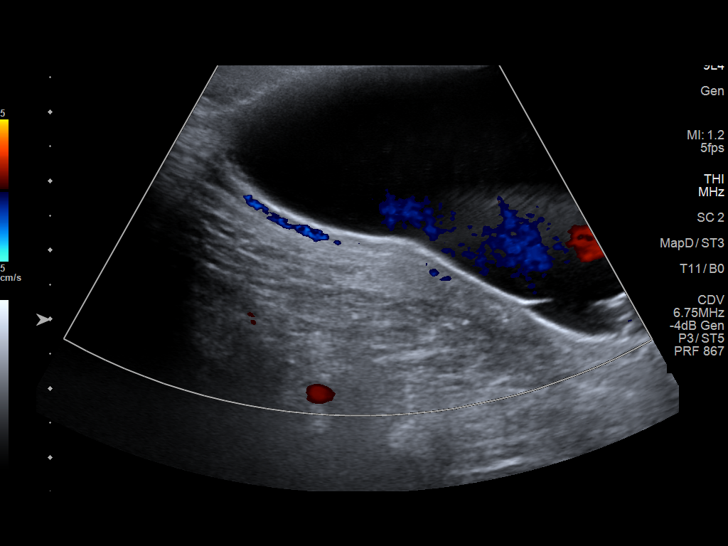
[im 29/77]
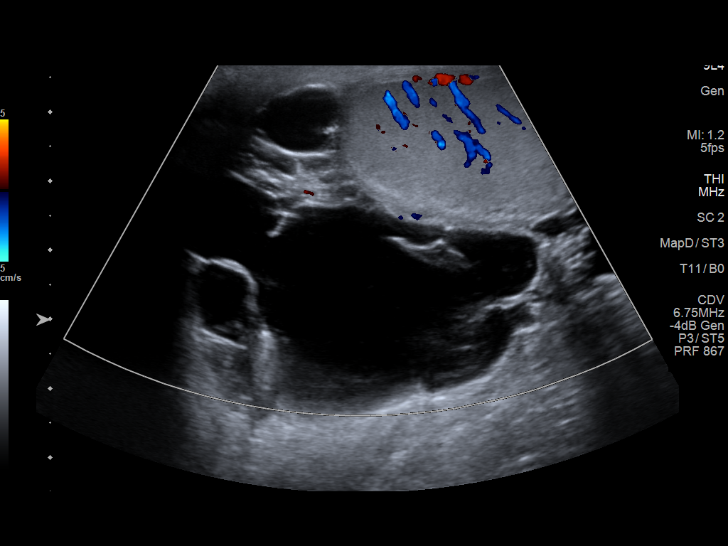
[im 35/77]
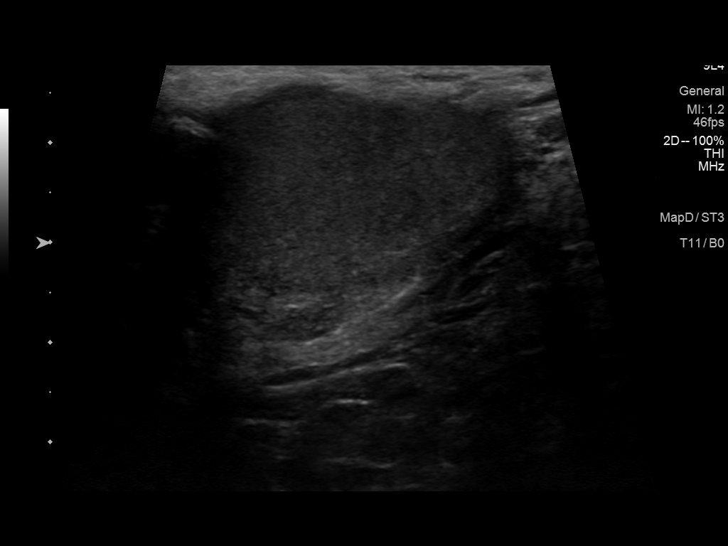
[im 42/77]
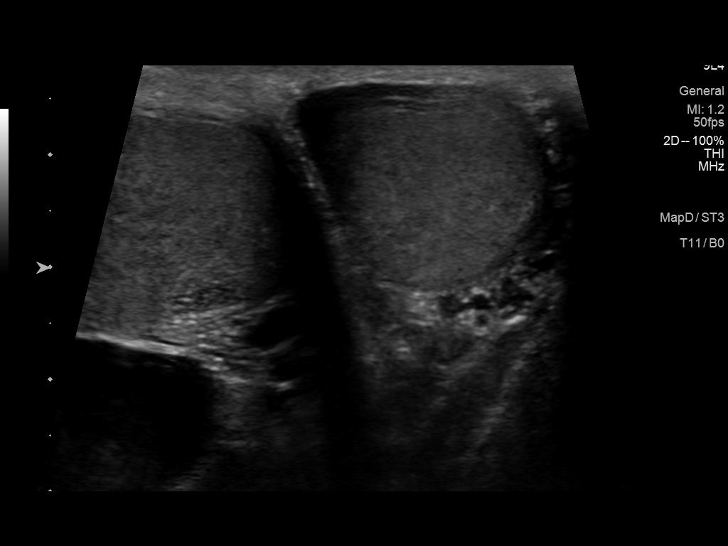
[im 48/77]
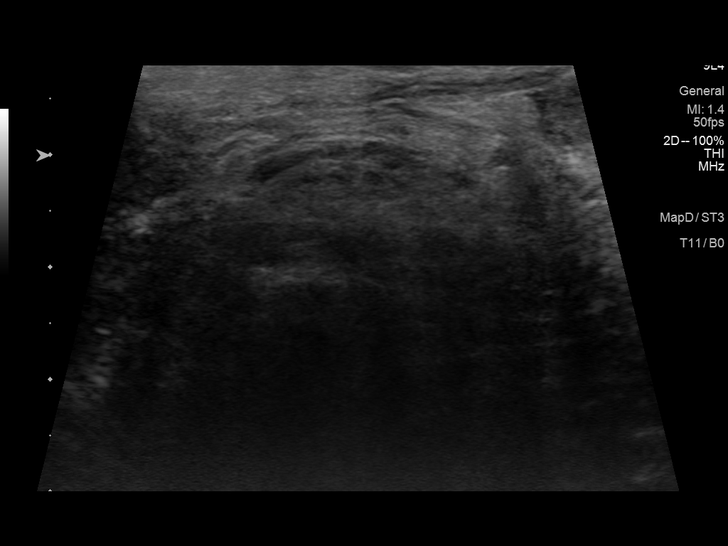
[im 51/77]
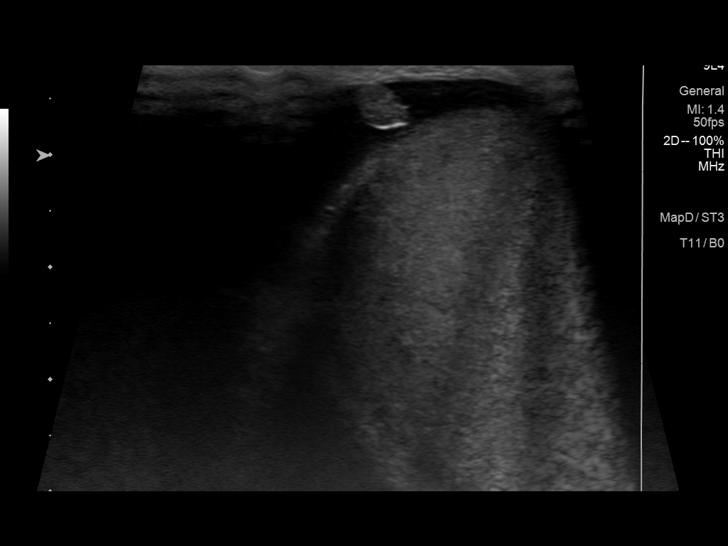
[im 58/77]
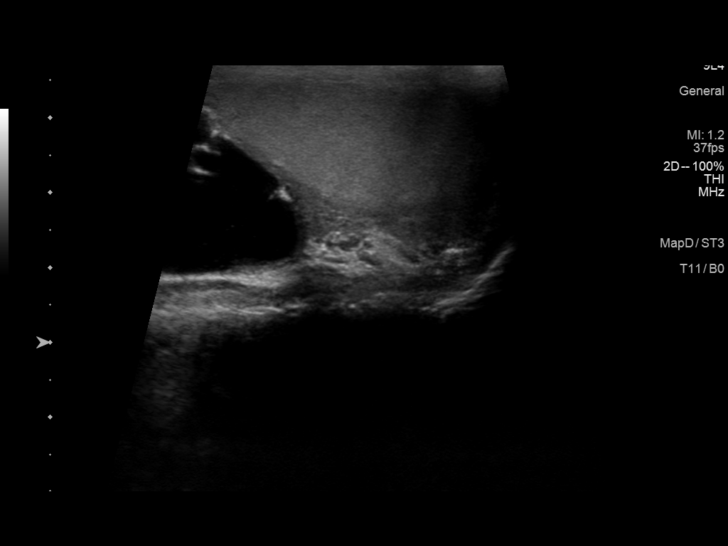
[im 64/77]
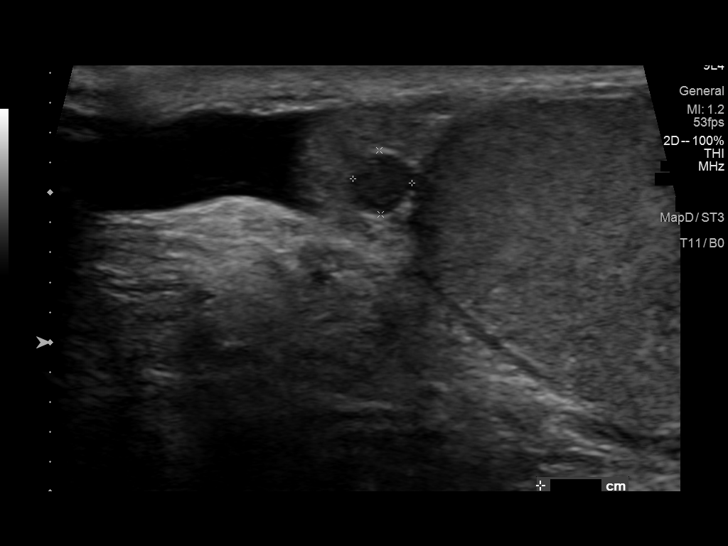
[im 70/77]
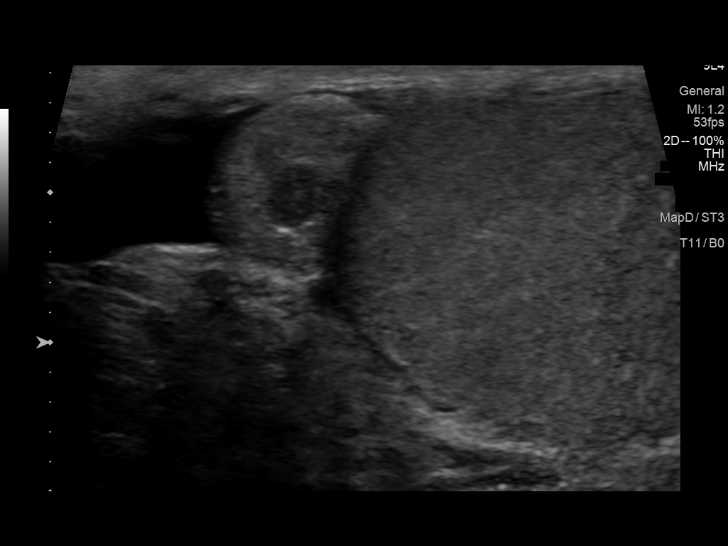
[im 77/77]
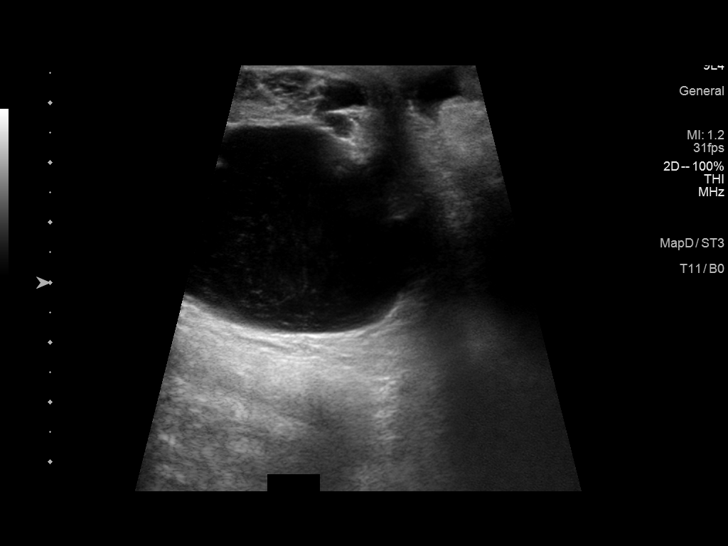

[14 of 25 positions shown; findings below may reference images not displayed]

FINDINGS: Right testicle

Measurements: 5.0 x 2.2 x 3.4 cm. No mass or microlithiasis
visualized.

Left testicle

Measurements: 4.9 x 2.4 x 2.6 cm. No mass or microlithiasis
visualized.

Right epididymis:  Normal in size and appearance.

Left epididymis:  4 mm complicated cyst, otherwise normal.

Hydrocele: Large cystic collection within the right scrotum with
mobile debris. Complex cystic multiloculated structure is seen in
the superior right scrotum, which may represent loculated hydrocele
versus an epididymis distended by multiple complicated cysts.

Varicocele:  None visualized.
IMPRESSION: Normal appearance of the testicles.

Large right hydrocele.

Complex cystic multiloculated structure is seen in the superior
right scrotum, which may represent loculated hydrocele versus an
epididymis distended by multiple complicated cysts.

## 2020-06-28 ENCOUNTER — Ambulatory Visit: Payer: No Typology Code available for payment source | Admitting: Family Medicine

## 2020-07-14 ENCOUNTER — Other Ambulatory Visit: Payer: Self-pay

## 2020-07-14 DIAGNOSIS — Z13220 Encounter for screening for lipoid disorders: Secondary | ICD-10-CM

## 2020-07-14 DIAGNOSIS — E7800 Pure hypercholesterolemia, unspecified: Secondary | ICD-10-CM

## 2020-07-14 DIAGNOSIS — Z136 Encounter for screening for cardiovascular disorders: Secondary | ICD-10-CM | POA: Diagnosis not present

## 2020-07-14 DIAGNOSIS — Z125 Encounter for screening for malignant neoplasm of prostate: Secondary | ICD-10-CM | POA: Diagnosis not present

## 2020-07-15 LAB — CBC WITH DIFFERENTIAL/PLATELET
Absolute Monocytes: 416 cells/uL (ref 200–950)
Basophils Absolute: 38 cells/uL (ref 0–200)
Basophils Relative: 0.9 %
Eosinophils Absolute: 218 cells/uL (ref 15–500)
Eosinophils Relative: 5.2 %
HCT: 43.6 % (ref 38.5–50.0)
Hemoglobin: 14.5 g/dL (ref 13.2–17.1)
Lymphs Abs: 1319 cells/uL (ref 850–3900)
MCH: 30.4 pg (ref 27.0–33.0)
MCHC: 33.3 g/dL (ref 32.0–36.0)
MCV: 91.4 fL (ref 80.0–100.0)
MPV: 10.7 fL (ref 7.5–12.5)
Monocytes Relative: 9.9 %
Neutro Abs: 2209 cells/uL (ref 1500–7800)
Neutrophils Relative %: 52.6 %
Platelets: 192 10*3/uL (ref 140–400)
RBC: 4.77 10*6/uL (ref 4.20–5.80)
RDW: 12.8 % (ref 11.0–15.0)
Total Lymphocyte: 31.4 %
WBC: 4.2 10*3/uL (ref 3.8–10.8)

## 2020-07-15 LAB — COMPLETE METABOLIC PANEL WITH GFR
AG Ratio: 2.4 (calc) (ref 1.0–2.5)
ALT: 15 U/L (ref 9–46)
AST: 18 U/L (ref 10–35)
Albumin: 4.4 g/dL (ref 3.6–5.1)
Alkaline phosphatase (APISO): 52 U/L (ref 35–144)
BUN: 22 mg/dL (ref 7–25)
CO2: 28 mmol/L (ref 20–32)
Calcium: 9.5 mg/dL (ref 8.6–10.3)
Chloride: 106 mmol/L (ref 98–110)
Creat: 0.8 mg/dL (ref 0.70–1.33)
GFR, Est African American: 113 mL/min/{1.73_m2} (ref >=60)
GFR, Est Non African American: 98 mL/min/{1.73_m2} (ref >=60)
Globulin: 1.8 g/dL (calc) — ABNORMAL LOW (ref 1.9–3.7)
Glucose, Bld: 99 mg/dL (ref 65–99)
Potassium: 4.5 mmol/L (ref 3.5–5.3)
Sodium: 142 mmol/L (ref 135–146)
Total Bilirubin: 1.1 mg/dL (ref 0.2–1.2)
Total Protein: 6.2 g/dL (ref 6.1–8.1)

## 2020-07-15 LAB — LIPID PANEL
Cholesterol: 154 mg/dL (ref <200)
HDL: 42 mg/dL (ref >=40)
LDL Cholesterol (Calc): 99 mg/dL (calc)
Non-HDL Cholesterol (Calc): 112 mg/dL (calc) (ref <130)
Total CHOL/HDL Ratio: 3.7 (calc) (ref <5.0)
Triglycerides: 45 mg/dL (ref <150)

## 2020-07-15 LAB — PSA: PSA: 2.7 ng/mL (ref <=4.0)

## 2020-07-18 ENCOUNTER — Ambulatory Visit: Payer: 59 | Admitting: Family Medicine

## 2020-07-18 ENCOUNTER — Other Ambulatory Visit: Payer: Self-pay

## 2020-07-18 VITALS — BP 118/76 | HR 73 | Temp 98.2°F | Resp 15 | Ht 73.0 in | Wt 214.0 lb

## 2020-07-18 DIAGNOSIS — Z0000 Encounter for general adult medical examination without abnormal findings: Secondary | ICD-10-CM | POA: Diagnosis not present

## 2020-07-18 NOTE — Progress Notes (Signed)
Subjective:    [name], male    DOB: [birth year], 60 y.o.   MRN: [id number]  HPI  Patient is here today for complete physical exam.  His last colonoscopy was in 2016.  There was one tubular adenoma.  GI change the recommendations to the patient and have now recommended 7 years later which will be in 2023.  He is PSA was just checked and was found to be within normal limits.  His immunizations are up-to-date as shown below: Immunization History  Administered Date(s) Administered  . Influenza,inj,Quad PF,6+ Mos 04/07/2013, 02/18/2014, 02/15/2015, 02/13/2016, 02/06/2017, 02/11/2018, 02/03/2019, 02/25/2020  . PFIZER(Purple Top)SARS-COV-2 Vaccination 08/01/2019, 08/24/2019, 03/19/2020  . Td 07/27/2003  . Tdap 04/07/2013  . Zoster Recombinat (Shingrix) 02/11/2018, 04/21/2018, 05/05/2018   Patient's blood pressures been elevated at home recently however here today is excellent.  He attributes this to stress that he was experiencing after a confrontation with his father at work.  However the situation has improved and they're doing better now.  He denies any chest pain shortness of breath or dyspnea on exertion.  He denies any concerns. Lab on 07/14/2020  Component Date Value Ref Range Status  . PSA 07/14/2020 2.70  < OR = 4.0 ng/mL Final   Comment: The total PSA value from this assay system is  standardized against the WHO standard. The test  result will be approximately 20% lower when compared  to the equimolar-standardized total PSA (Beckman  Coulter). Comparison of serial PSA results should be  interpreted with this fact in mind. . This test was performed using the Siemens  chemiluminescent method. Values obtained from  different assay methods cannot be used interchangeably. PSA levels, regardless of value, should not be interpreted as absolute evidence of the presence or absence of disease.   . Cholesterol 07/14/2020 154  <200 mg/dL Final  . HDL 07/14/2020 42  > OR  = 40 mg/dL Final  . Triglycerides 07/14/2020 45  <150 mg/dL Final  . LDL Cholesterol (Calc) 07/14/2020 99  mg/dL (calc) Final   Comment: Reference range: <100 . Desirable range <100 mg/dL for primary prevention;   <70 mg/dL for patients with CHD or diabetic patients  with > or = 2 CHD risk factors. Marland Kitchen LDL-C is now calculated using the Martin-Hopkins  calculation, which is a validated novel method providing  better accuracy than the Friedewald equation in the  estimation of LDL-C.  Cresenciano Genre et al. Annamaria Helling. 6270;350(09): 2061-2068  (http://education.[facility].com/faq/FAQ164)   . Total CHOL/HDL Ratio 07/14/2020 3.7  <5.0 (calc) Final  . Non-HDL Cholesterol (Calc) 07/14/2020 112  <130 mg/dL (calc) Final   Comment: For patients with diabetes plus 1 major ASCVD risk  factor, treating to a non-HDL-C goal of <100 mg/dL  (LDL-C of <70 mg/dL) is considered a therapeutic  option.   . Glucose, Bld 07/14/2020 99  65 - 99 mg/dL Final   Comment: .            Fasting reference interval .   . BUN 07/14/2020 22  7 - 25 mg/dL Final  . Creat 07/14/2020 0.80  0.70 - 1.33 mg/dL Final   Comment: For patients >34 years of age, the reference limit for Creatinine is approximately 13% higher for people identified as African-American. .   . GFR, Est Non African American 07/14/2020 98  > OR = 60 mL/min/1.56m2 Final  . GFR, Est African American 07/14/2020 113  > OR = 60 mL/min/1.77m2 Final  . BUN/Creatinine Ratio 07/14/2020 NOT  APPLICABLE  6 - 22 (calc) Final  . Sodium 07/14/2020 142  135 - 146 mmol/L Final  . Potassium 07/14/2020 4.5  3.5 - 5.3 mmol/L Final  . Chloride 07/14/2020 106  98 - 110 mmol/L Final  . CO2 07/14/2020 28  20 - 32 mmol/L Final  . Calcium 07/14/2020 9.5  8.6 - 10.3 mg/dL Final  . Total Protein 07/14/2020 6.2  6.1 - 8.1 g/dL Final  . Albumin 07/14/2020 4.4  3.6 - 5.1 g/dL Final  . Globulin 07/14/2020 1.8* 1.9 - 3.7 g/dL (calc) Final  . AG Ratio 07/14/2020 2.4  1.0 - 2.5  (calc) Final  . Total Bilirubin 07/14/2020 1.1  0.2 - 1.2 mg/dL Final  . Alkaline phosphatase (APISO) 07/14/2020 52  35 - 144 U/L Final  . AST 07/14/2020 18  10 - 35 U/L Final  . ALT 07/14/2020 15  9 - 46 U/L Final  . WBC 07/14/2020 4.2  3.8 - 10.8 Thousand/uL Final  . RBC 07/14/2020 4.77  4.20 - 5.80 Million/uL Final  . Hemoglobin 07/14/2020 14.5  13.2 - 17.1 g/dL Final  . HCT 07/14/2020 43.6  38.5 - 50.0 % Final  . MCV 07/14/2020 91.4  80.0 - 100.0 fL Final  . MCH 07/14/2020 30.4  27.0 - 33.0 pg Final  . MCHC 07/14/2020 33.3  32.0 - 36.0 g/dL Final  . RDW 07/14/2020 12.8  11.0 - 15.0 % Final  . Platelets 07/14/2020 192  140 - 400 Thousand/uL Final  . MPV 07/14/2020 10.7  7.5 - 12.5 fL Final  . Neutro Abs 07/14/2020 2,209  1,500 - 7,800 cells/uL Final  . Lymphs Abs 07/14/2020 1,319  850 - 3,900 cells/uL Final  . Absolute Monocytes 07/14/2020 416  200 - 950 cells/uL Final  . Eosinophils Absolute 07/14/2020 218  15 - 500 cells/uL Final  . Basophils Absolute 07/14/2020 38  0 - 200 cells/uL Final  . Neutrophils Relative % 07/14/2020 52.6  % Final  . Total Lymphocyte 07/14/2020 31.4  % Final  . Monocytes Relative 07/14/2020 9.9  % Final  . Eosinophils Relative 07/14/2020 5.2  % Final  . Basophils Relative 07/14/2020 0.9  % Final   Past Medical History:  Diagnosis Date  . Hx of adenomatous polyp of colon 07/08/2014  . Hyperlipidemia   . Low back pain    Past Surgical History:  Procedure Laterality Date  . COLONOSCOPY  09-17-2003   normal dr Wynetta Emery  . HERNIA REPAIR     Current Outpatient Medications on File Prior to Visit  Medication Sig Dispense Refill  . amoxicillin (AMOXIL) 875 MG tablet Take 1 tablet (875 mg total) by mouth 2 (two) times daily. 20 tablet 0  . aspirin 81 MG tablet Take 81 mg by mouth daily.    . Calcium 250 MG CAPS     . Cholecalciferol (VITAMIN D) 125 MCG (5000 UT) CAPS     . diclofenac (VOLTAREN) 75 MG EC tablet Take 1 tablet (75 mg total) by mouth 2 (two)  times daily. 60 tablet 2  . HYDROcodone-acetaminophen (NORCO) 5-325 MG tablet Take 1 tablet by mouth every 6 (six) hours as needed for moderate pain. 10 tablet 0  . lidocaine (XYLOCAINE) 2 % solution Use as directed 15 mLs in the mouth or throat every 4 (four) hours as needed for mouth pain. 120 mL 0  . metaxalone (SKELAXIN) 800 MG tablet Take 1 tablet (800 mg total) by mouth 3 (three) times daily. 90 tablet 1  . Multiple Vitamin (  MULTIVITAMIN) tablet Take 1 tablet by mouth daily.    . Omega-3 Fatty Acids (FISH OIL CONCENTRATE PO) Take by mouth.    . rosuvastatin (CRESTOR) 5 MG tablet Take 1 tablet (5 mg total) by mouth daily. 90 tablet 3  . traMADol (ULTRAM) 50 MG tablet Take 1 tablet (50 mg total) by mouth 3 (three) times daily as needed. 30 tablet 2   No current facility-administered medications on file prior to visit.   Allergies  Allergen Reactions  . Niaspan [Niacin Er]     Severe flushing   Social History   Socioeconomic History  . Marital status: Married    Spouse name: Not on file  . Number of children: Not on file  . Years of education: Not on file  . Highest education level: Not on file  Occupational History  . Not on file  Tobacco Use  . Smoking status: Never Smoker  . Smokeless tobacco: Never Used  Substance and Sexual Activity  . Alcohol use: No    Alcohol/week: 0.0 standard drinks  . Drug use: No  . Sexual activity: Yes    Comment: married.  Works in Therapist, occupational, Psychologist, educational  Other Topics Concern  . Not on file  Social History Narrative  . Not on file   Social Determinants of Health   Financial Resource Strain: Not on file  Food Insecurity: Not on file  Transportation Needs: Not on file  Physical Activity: Not on file  Stress: Not on file  Social Connections: Not on file  Intimate Partner Violence: Not on file   Family History  Problem Relation Age of Onset  . Alcohol abuse Brother   . Hyperlipidemia Brother   . Diabetes Father   . Colon cancer  Maternal Grandmother   . Esophageal cancer Neg Hx   . Rectal cancer Neg Hx   . Stomach cancer Neg Hx       Review of Systems  All other systems reviewed and are negative.      Objective:   Physical Exam Vitals reviewed.  Constitutional:      General: He is not in acute distress.    Appearance: He is well-developed. He is not diaphoretic.  HENT:     Head: Normocephalic and atraumatic.     Right Ear: External ear normal.     Left Ear: External ear normal.     Nose: Nose normal.     Mouth/Throat:     Pharynx: No oropharyngeal exudate.  Eyes:     General: No scleral icterus.       Right eye: No discharge.        Left eye: No discharge.     Conjunctiva/sclera: Conjunctivae normal.     Pupils: Pupils are equal, round, and reactive to light.  Neck:     Thyroid: No thyromegaly.     Vascular: No JVD.     Trachea: No tracheal deviation.  Cardiovascular:     Rate and Rhythm: Normal rate and regular rhythm.     Heart sounds: Normal heart sounds. No murmur heard. No friction rub. No gallop.   Pulmonary:     Effort: Pulmonary effort is normal. No respiratory distress.     Breath sounds: Normal breath sounds. No stridor. No wheezing or rales.  Chest:     Chest wall: No tenderness.  Abdominal:     General: Bowel sounds are normal. There is no distension.     Palpations: Abdomen is soft. There is no mass.  Tenderness: There is no abdominal tenderness. There is no guarding or rebound.  Musculoskeletal:        General: No tenderness. Normal range of motion.     Cervical back: Normal range of motion and neck supple.  Lymphadenopathy:     Cervical: No cervical adenopathy.  Skin:    General: Skin is warm.     Coloration: Skin is not pale.     Findings: No erythema or rash.  Neurological:     Mental Status: He is alert and oriented to person, place, and time.     Cranial Nerves: No cranial nerve deficit.     Motor: No abnormal muscle tone.     Coordination: Coordination  normal.     Deep Tendon Reflexes: Reflexes are normal and symmetric.  Psychiatric:        Behavior: Behavior normal.        Thought Content: Thought content normal.        Judgment: Judgment normal.           Assessment & Plan:  General medical exam  Physical exam today is normal.  Blood pressure is excellent.  Immunizations are up-to-date.  Colonoscopy is due next year.  PSA is within normal limits.  The remainder of his lab work is excellent.  Blood pressure is outstanding.

## 2020-08-04 ENCOUNTER — Other Ambulatory Visit: Payer: Self-pay | Admitting: Family Medicine

## 2020-08-08 ENCOUNTER — Other Ambulatory Visit: Payer: Self-pay | Admitting: Family Medicine

## 2020-08-08 MED FILL — ROSUVASTATIN CALCIUM 5 MG T: 5 | 90 days supply | Qty: 90 | Fill #0

## 2020-08-09 ENCOUNTER — Other Ambulatory Visit: Payer: Self-pay | Admitting: Family Medicine

## 2020-08-09 ENCOUNTER — Other Ambulatory Visit (HOSPITAL_BASED_OUTPATIENT_CLINIC_OR_DEPARTMENT_OTHER): Payer: Self-pay

## 2020-10-31 ENCOUNTER — Other Ambulatory Visit (HOSPITAL_COMMUNITY): Payer: Self-pay

## 2020-10-31 MED FILL — Rosuvastatin Calcium Tab 5 MG: ORAL | 90 days supply | Qty: 90 | Fill #0 | Status: AC

## 2020-11-01 ENCOUNTER — Other Ambulatory Visit (HOSPITAL_COMMUNITY): Payer: Self-pay

## 2020-11-17 ENCOUNTER — Encounter: Payer: Self-pay | Admitting: Family Medicine

## 2020-11-22 ENCOUNTER — Encounter: Payer: Self-pay | Admitting: Family Medicine

## 2020-12-27 ENCOUNTER — Ambulatory Visit: Payer: No Typology Code available for payment source | Admitting: Family Medicine

## 2021-01-19 ENCOUNTER — Other Ambulatory Visit (HOSPITAL_COMMUNITY): Payer: Self-pay

## 2021-01-19 ENCOUNTER — Other Ambulatory Visit (HOSPITAL_BASED_OUTPATIENT_CLINIC_OR_DEPARTMENT_OTHER): Payer: Self-pay

## 2021-01-19 MED FILL — Rosuvastatin Calcium Tab 5 MG: ORAL | 90 days supply | Qty: 90 | Fill #1 | Status: AC

## 2021-02-05 ENCOUNTER — Encounter: Payer: Self-pay | Admitting: Family Medicine

## 2021-02-07 ENCOUNTER — Encounter: Payer: Self-pay | Admitting: *Deleted

## 2021-02-07 DIAGNOSIS — H35719 Central serous chorioretinopathy, unspecified eye: Secondary | ICD-10-CM | POA: Insufficient documentation

## 2021-02-09 ENCOUNTER — Other Ambulatory Visit: Payer: Self-pay | Admitting: Family Medicine

## 2021-02-09 ENCOUNTER — Other Ambulatory Visit: Payer: Self-pay

## 2021-02-09 ENCOUNTER — Encounter: Payer: Self-pay | Admitting: Family Medicine

## 2021-02-09 ENCOUNTER — Ambulatory Visit (INDEPENDENT_AMBULATORY_CARE_PROVIDER_SITE_OTHER): Payer: 59

## 2021-02-09 ENCOUNTER — Ambulatory Visit (INDEPENDENT_AMBULATORY_CARE_PROVIDER_SITE_OTHER): Payer: 59 | Admitting: Family Medicine

## 2021-02-09 VITALS — BP 122/74 | HR 82 | Ht 73.0 in | Wt 214.0 lb

## 2021-02-09 DIAGNOSIS — M545 Low back pain, unspecified: Secondary | ICD-10-CM | POA: Diagnosis not present

## 2021-02-09 DIAGNOSIS — M25561 Pain in right knee: Secondary | ICD-10-CM | POA: Diagnosis not present

## 2021-02-09 DIAGNOSIS — M1611 Unilateral primary osteoarthritis, right hip: Secondary | ICD-10-CM | POA: Diagnosis not present

## 2021-02-09 DIAGNOSIS — M79604 Pain in right leg: Secondary | ICD-10-CM

## 2021-02-09 DIAGNOSIS — M25551 Pain in right hip: Secondary | ICD-10-CM | POA: Diagnosis not present

## 2021-02-09 DIAGNOSIS — M25562 Pain in left knee: Secondary | ICD-10-CM

## 2021-02-09 NOTE — Progress Notes (Signed)
Zach Monic Engelmann Mike Perez [address] [place] Phone: [phone] Subjective:   IVilma Perez, am serving as a scribe for Dr. Hulan Saas. This visit occurred during the SARS-CoV-2 public health emergency.  Safety protocols were in place, including screening questions prior to the visit, additional usage of staff PPE, and extensive cleaning of exam room while observing appropriate contact time as indicated for disinfecting solutions.   I'm seeing this patient by the request  of:  Susy Frizzle, MD  CC: Low back pain right leg pain  HCW:[id number]  ISHMAEL Perez is a 60 y.o. male coming in with complaint of knee, leg, and back pain. Back pain comes and goes. His main concern today were his legs. Both legs but particularly the right one feels like they are losing strength. He feels a tightness in his quads that go to the knee. Pain is worse when squatting down. Also in April he was moving an object onto a truck and felt a pain in his upper abdominal region.       Past Medical History:  Diagnosis Date   Hx of adenomatous polyp of colon 07/08/2014   Hyperlipidemia    Low back pain    Past Surgical History:  Procedure Laterality Date   COLONOSCOPY  09-17-2003   normal dr Wynetta Emery   HERNIA REPAIR     Social History   Socioeconomic History   Marital status: Married    Spouse name: Not on file   Number of children: Not on file   Years of education: Not on file   Highest education level: Not on file  Occupational History   Not on file  Tobacco Use   Smoking status: Never   Smokeless tobacco: Never  Substance and Sexual Activity   Alcohol use: No    Alcohol/week: 0.0 standard drinks   Drug use: No   Sexual activity: Yes    Comment: married.  Works in Therapist, occupational, Psychologist, educational  Other Topics Concern   Not on file  Social History Narrative   Not on file   Social Determinants of Health   Financial Resource Strain: Not on file  Food  Insecurity: Not on file  Transportation Needs: Not on file  Physical Activity: Not on file  Stress: Not on file  Social Connections: Not on file   Allergies  Allergen Reactions   Niaspan [Niacin Er]     Severe flushing   Family History  Problem Relation Age of Onset   Alcohol abuse Brother    Hyperlipidemia Brother    Diabetes Father    Colon cancer Maternal Grandmother    Esophageal cancer Neg Hx    Rectal cancer Neg Hx    Stomach cancer Neg Hx      Current Outpatient Medications (Cardiovascular):    rosuvastatin (CRESTOR) 5 MG tablet, TAKE 1 TABLET BY MOUTH DAILY   Current Outpatient Medications (Analgesics):    aspirin 81 MG tablet, Take 81 mg by mouth daily.   Current Outpatient Medications (Other):    Cholecalciferol (VITAMIN D) 125 MCG (5000 UT) CAPS,    Multiple Vitamin (MULTIVITAMIN) tablet, Take 1 tablet by mouth daily.   Omega-3 Fatty Acids (FISH OIL CONCENTRATE PO), Take by mouth.   Oyster Shell (OYSTER CALCIUM) 500 MG TABS tablet, Take 500 mg of elemental calcium by mouth daily.   Reviewed prior external information including notes and imaging from  primary care provider As well as notes that were available from care everywhere  and other healthcare systems.  Past medical history, social, surgical and family history all reviewed in electronic medical record.  No pertanent information unless stated regarding to the chief complaint.   Review of Systems:  No headache, visual changes, nausea, vomiting, diarrhea, constipation, dizziness, abdominal pain, skin rash, fevers, chills, night sweats, weight loss, swollen lymph nodes, body aches, joint swelling, chest pain, shortness of breath, mood changes. POSITIVE muscle aches  Objective  Blood pressure 122/74, pulse 82, height 6\' 1"  (1.854 m), weight 214 lb (97.1 kg), SpO2 98 %.   General: No apparent distress alert and oriented x3 mood and affect normal, dressed appropriately.  HEENT: Pupils equal, extraocular  movements intact  Respiratory: Patient's speak in full sentences and does not appear short of breath  Cardiovascular: No lower extremity edema, non tender, no erythema  Gait severely antalgic MSK: Patient's low back does have loss of lordosis. Patient's right hip has 0 degrees of internal rotation.  Patient does have very mild atrophy of the thigh on the right side compared to the contralateral side.  Patient has only 20 degrees of external rotation.  Negative fulcrum test of the femur noted.  Back exam normal does not have any significant pain to palpation  Patient is an x-rays done today.  X-rays of the right hip show that patient does have severe narrowing of the hip joint.  Lumbar spine x-ray showed that patient does have chronic degenerative disc disease with severe facet arthropathy right greater than left of the L5-S1 area and degenerative disc disease at multiple levels of the lumbar spine.  No abnormality noted of the thigh itself.   Impression and Recommendations:     The above documentation has been reviewed and is accurate and complete Lyndal Pulley, DO

## 2021-02-09 NOTE — Assessment & Plan Note (Signed)
When comparing to previous x-rays today patient shows severe nearly bone-on-bone osteoarthritic changes of the right knee.  Do feel that this is likely contributing to more of the leg pain that patient is having at the moment.  Discussed with patient that there is different treatment options that are available.  Due to the severity of the arthritis I do feel that a possible surgical intervention would be his best modality.  Patient will follow up with me more on an as-needed basis.  Total time discussing with patient, going over imaging and discussing other treatment options as well as reviewing previous notes 46 minutes

## 2021-02-10 ENCOUNTER — Encounter: Payer: Self-pay | Admitting: Family Medicine

## 2021-02-14 ENCOUNTER — Other Ambulatory Visit: Payer: Self-pay

## 2021-02-14 DIAGNOSIS — M2550 Pain in unspecified joint: Secondary | ICD-10-CM

## 2021-02-14 DIAGNOSIS — R109 Unspecified abdominal pain: Secondary | ICD-10-CM

## 2021-02-14 DIAGNOSIS — M1611 Unilateral primary osteoarthritis, right hip: Secondary | ICD-10-CM

## 2021-02-18 ENCOUNTER — Encounter: Payer: Self-pay | Admitting: Family Medicine

## 2021-02-21 ENCOUNTER — Other Ambulatory Visit: Payer: Self-pay

## 2021-02-21 DIAGNOSIS — M1611 Unilateral primary osteoarthritis, right hip: Secondary | ICD-10-CM

## 2021-02-21 DIAGNOSIS — M1612 Unilateral primary osteoarthritis, left hip: Secondary | ICD-10-CM

## 2021-02-21 NOTE — Telephone Encounter (Signed)
Scheduled

## 2021-02-23 ENCOUNTER — Other Ambulatory Visit: Payer: Self-pay

## 2021-02-23 ENCOUNTER — Other Ambulatory Visit (INDEPENDENT_AMBULATORY_CARE_PROVIDER_SITE_OTHER): Payer: 59

## 2021-02-23 ENCOUNTER — Other Ambulatory Visit: Payer: 59

## 2021-02-23 DIAGNOSIS — M2550 Pain in unspecified joint: Secondary | ICD-10-CM | POA: Diagnosis not present

## 2021-02-23 LAB — COMPREHENSIVE METABOLIC PANEL
ALT: 18 U/L (ref 0–53)
AST: 22 U/L (ref 0–37)
Albumin: 4.3 g/dL (ref 3.5–5.2)
Alkaline Phosphatase: 51 U/L (ref 39–117)
BUN: 18 mg/dL (ref 6–23)
CO2: 30 mEq/L (ref 19–32)
Calcium: 9.2 mg/dL (ref 8.4–10.5)
Chloride: 105 mEq/L (ref 96–112)
Creatinine, Ser: 0.79 mg/dL (ref 0.40–1.50)
GFR: 97.15 mL/min (ref >60.00)
Glucose, Bld: 73 mg/dL (ref 70–99)
Potassium: 4 mEq/L (ref 3.5–5.1)
Sodium: 141 mEq/L (ref 135–145)
Total Bilirubin: 1.4 mg/dL — ABNORMAL HIGH (ref 0.2–1.2)
Total Protein: 6.4 g/dL (ref 6.0–8.3)

## 2021-02-23 LAB — CBC WITH DIFFERENTIAL/PLATELET
Basophils Absolute: 0 10*3/uL (ref 0.0–0.1)
Basophils Relative: 0.6 % (ref 0.0–3.0)
Eosinophils Absolute: 0.3 10*3/uL (ref 0.0–0.7)
Eosinophils Relative: 6 % — ABNORMAL HIGH (ref 0.0–5.0)
HCT: 42.4 % (ref 39.0–52.0)
Hemoglobin: 14.1 g/dL (ref 13.0–17.0)
Lymphocytes Relative: 25.8 % (ref 12.0–46.0)
Lymphs Abs: 1.3 10*3/uL (ref 0.7–4.0)
MCHC: 33.2 g/dL (ref 30.0–36.0)
MCV: 91.1 fl (ref 78.0–100.0)
Monocytes Absolute: 0.5 10*3/uL (ref 0.1–1.0)
Monocytes Relative: 9.3 % (ref 3.0–12.0)
Neutro Abs: 2.8 10*3/uL (ref 1.4–7.7)
Neutrophils Relative %: 58.3 % (ref 43.0–77.0)
Platelets: 188 10*3/uL (ref 150.0–400.0)
RBC: 4.66 Mil/uL (ref 4.22–5.81)
RDW: 13.7 % (ref 11.5–15.5)
WBC: 4.9 10*3/uL (ref 4.0–10.5)

## 2021-02-23 LAB — SEDIMENTATION RATE: Sed Rate: 8 mm/hr (ref 0–20)

## 2021-02-23 LAB — VITAMIN D 25 HYDROXY (VIT D DEFICIENCY, FRACTURES): VITD: 52.08 ng/mL (ref 30.00–100.00)

## 2021-02-23 LAB — FERRITIN: Ferritin: 116.5 ng/mL (ref 22.0–322.0)

## 2021-02-23 LAB — C-REACTIVE PROTEIN: CRP: <1 mg/dL (ref 0.5–20.0)

## 2021-02-23 LAB — IBC PANEL
Iron: 100 ug/dL (ref 42–165)
Saturation Ratios: 29.5 % (ref 20.0–50.0)
TIBC: 338.8 ug/dL (ref 250.0–450.0)
Transferrin: 242 mg/dL (ref 212.0–360.0)

## 2021-02-23 LAB — VITAMIN B12: Vitamin B-12: 432 pg/mL (ref 211–911)

## 2021-02-23 LAB — TESTOSTERONE: Testosterone: 184.16 ng/dL — ABNORMAL LOW (ref 300.00–890.00)

## 2021-02-23 LAB — URIC ACID: Uric Acid, Serum: 4.8 mg/dL (ref 4.0–7.8)

## 2021-02-23 LAB — TSH: TSH: 1.47 u[IU]/mL (ref 0.35–5.50)

## 2021-02-26 ENCOUNTER — Encounter: Payer: Self-pay | Admitting: Family Medicine

## 2021-02-27 ENCOUNTER — Ambulatory Visit (INDEPENDENT_AMBULATORY_CARE_PROVIDER_SITE_OTHER)
Admission: RE | Admit: 2021-02-27 | Discharge: 2021-02-27 | Disposition: A | Discharge status: Home / Self Care | Payer: 59 | Source: Ambulatory Visit

## 2021-02-27 ENCOUNTER — Other Ambulatory Visit: Payer: Self-pay

## 2021-02-27 DIAGNOSIS — M545 Low back pain, unspecified: Secondary | ICD-10-CM

## 2021-02-27 DIAGNOSIS — M25561 Pain in right knee: Secondary | ICD-10-CM

## 2021-02-27 DIAGNOSIS — M1611 Unilateral primary osteoarthritis, right hip: Secondary | ICD-10-CM

## 2021-02-27 DIAGNOSIS — M2550 Pain in unspecified joint: Secondary | ICD-10-CM

## 2021-02-27 DIAGNOSIS — M25562 Pain in left knee: Secondary | ICD-10-CM

## 2021-02-27 DIAGNOSIS — M1612 Unilateral primary osteoarthritis, left hip: Secondary | ICD-10-CM | POA: Diagnosis not present

## 2021-02-27 LAB — CYCLIC CITRUL PEPTIDE ANTIBODY, IGG: Cyclic Citrullin Peptide Ab: <16 UNITS

## 2021-02-27 LAB — RHEUMATOID FACTOR: Rheumatoid fact SerPl-aCnc: <14 IU/mL (ref <14)

## 2021-02-27 LAB — PTH, INTACT AND CALCIUM
Calcium: 9.2 mg/dL (ref 8.6–10.3)
PTH: 17 pg/mL (ref 16–77)

## 2021-02-27 LAB — ANA: Anti Nuclear Antibody (ANA): NEGATIVE

## 2021-02-27 LAB — CALCIUM, IONIZED: Calcium, Ion: 4.75 mg/dL — ABNORMAL LOW (ref 4.8–5.6)

## 2021-02-27 LAB — ANGIOTENSIN CONVERTING ENZYME: Angiotensin-Converting Enzyme: 8 U/L — ABNORMAL LOW (ref 9–67)

## 2021-03-01 DIAGNOSIS — H524 Presbyopia: Secondary | ICD-10-CM | POA: Diagnosis not present

## 2021-03-01 DIAGNOSIS — H5203 Hypermetropia, bilateral: Secondary | ICD-10-CM | POA: Diagnosis not present

## 2021-03-02 ENCOUNTER — Inpatient Hospital Stay: Admission: RE | Admit: 2021-03-02 | Payer: 59 | Source: Ambulatory Visit

## 2021-03-06 ENCOUNTER — Encounter: Payer: Self-pay | Admitting: Family Medicine

## 2021-03-06 MED ORDER — CALCIUM CARBONATE 600 MG PO TABS: 600.0000 mg | ORAL_TABLET | Freq: Two times a day (BID) | ORAL | Status: AC

## 2021-03-06 MED ORDER — VITAMIN D3 50 MCG (2000 UT) PO CAPS: 2000.0000 [IU] | ORAL_CAPSULE | Freq: Every day | ORAL | Status: AC

## 2021-03-08 ENCOUNTER — Ambulatory Visit (INDEPENDENT_AMBULATORY_CARE_PROVIDER_SITE_OTHER): Payer: 59 | Admitting: Orthopaedic Surgery

## 2021-03-08 ENCOUNTER — Encounter: Payer: Self-pay | Admitting: Orthopaedic Surgery

## 2021-03-08 ENCOUNTER — Other Ambulatory Visit: Payer: Self-pay

## 2021-03-08 DIAGNOSIS — M79604 Pain in right leg: Secondary | ICD-10-CM

## 2021-03-08 DIAGNOSIS — M79605 Pain in left leg: Secondary | ICD-10-CM

## 2021-03-08 DIAGNOSIS — M1611 Unilateral primary osteoarthritis, right hip: Secondary | ICD-10-CM | POA: Diagnosis not present

## 2021-03-08 NOTE — Progress Notes (Signed)
The patient is someone I am seeing for the first time but he has seen one of my partners before.  He has well-documented and well-known osteoarthritis of his right hip.  He also has osteoarthritis of the lumbar spine and he does let me know that an MRI has been ordered and is pending for his lumbar spine to rule out nerve compression.  He denies any groin pain on the right side and does report right hip stiffness but denies any significant pain.  There is no numbness and tingling in his legs but he felt like his legs are stiff and just hurt quite a bit.  He has had x-rays of his knee before and all these are on canopy system for me to review.  He has not had any type of physical therapy.  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  He is not a diabetic.  He denies any headache, chest pain, shortness of breath, fever, chills, nausea, vomiting.  He is not obese.  On exam his right hip does have some stiffness with internal and external rotation the left hip moves more smoothly.  He does not have any pain with rotation of his right hip.  Both knees are slightly stiff but there is no effusion with either knee with good range of motion.  He has pretty good strength in both lower extremities.  There is pain with flexion extension of the lumbar spine and stiffness and tightness of his hamstrings and his lumbar spine.  The x-rays of his pelvis some right hip shows severe end-stage arthritis with bone-on-bone wear and complete loss of joint space.  There is significant degenerative changes also in the lower aspect of the lumbar spine especially at L5-S1.  The knee joint space on his right knee is well-maintained.  From my standpoint he eventually would benefit from a hip replacement but he is not really hurting much.  I do feel that hip replacement would help his back to be less stiff feeling in his legs feel better.  However, it is worth seeing what the MRI shows  of his lumbar spine and it is worth putting him through physical therapy just to work on improving the flexibility and strength of his bilateral lower extremities and his lumbar spine.  We will set him up for outpatient physical therapy and then see him back in follow-up to see how he is doing from that and to see what the MRI shows the lumbar spine.  He agrees with this treatment plan.  All question concerns were answered and addressed.

## 2021-03-09 ENCOUNTER — Other Ambulatory Visit: Payer: Self-pay

## 2021-03-09 DIAGNOSIS — M79605 Pain in left leg: Secondary | ICD-10-CM

## 2021-03-09 DIAGNOSIS — M79604 Pain in right leg: Secondary | ICD-10-CM

## 2021-03-11 ENCOUNTER — Telehealth: Payer: 59 | Admitting: Emergency Medicine

## 2021-03-11 DIAGNOSIS — R051 Acute cough: Secondary | ICD-10-CM | POA: Diagnosis not present

## 2021-03-11 DIAGNOSIS — U071 COVID-19: Secondary | ICD-10-CM

## 2021-03-11 DIAGNOSIS — R6889 Other general symptoms and signs: Secondary | ICD-10-CM

## 2021-03-11 MED ORDER — BENZONATATE 100 MG PO CAPS: 100.0000 mg | ORAL_CAPSULE | Freq: Two times a day (BID) | ORAL | 0 refills | Status: DC | PRN

## 2021-03-11 MED ORDER — NIRMATRELVIR/RITONAVIR (PAXLOVID)TABLET: 3.0000 | ORAL_TABLET | Freq: Two times a day (BID) | ORAL | 0 refills | Status: AC

## 2021-03-11 NOTE — Patient Instructions (Signed)
Mike Perez, thank you for joining [name], PA-C for today's virtual visit.  While this provider is not your primary care provider (PCP), if your PCP is located in our provider database this encounter information will be shared with them immediately following your visit.  Consent: (Patient) Mike Perez provided verbal consent for this virtual visit at the beginning of the encounter.  Current Medications:  Current Outpatient Medications:    benzonatate (TESSALON) 100 MG capsule, Take 1 capsule (100 mg total) by mouth 2 (two) times daily as needed for cough., Disp: 20 capsule, Rfl: 0   nirmatrelvir/ritonavir EUA (PAXLOVID) 20 x 150 MG & 10 x 100MG  TABS, Take 3 tablets by mouth 2 (two) times daily for 5 days. (Take nirmatrelvir 150 mg two tablets twice daily for 5 days and ritonavir 100 mg one tablet twice daily for 5 days) Patient GFR is 79, Disp: 30 tablet, Rfl: 0   aspirin 81 MG tablet, Take 81 mg by mouth daily., Disp: , Rfl:    calcium carbonate (CALCIUM 600) 600 MG TABS tablet, Take 1 tablet (600 mg total) by mouth 2 (two) times daily with a meal., Disp: 30 tablet, Rfl:    Cholecalciferol (VITAMIN D3) 50 MCG (2000 UT) capsule, Take 1 capsule (2,000 Units total) by mouth daily., Disp: , Rfl:    Multiple Vitamin (MULTIVITAMIN) tablet, Take 1 tablet by mouth daily., Disp: , Rfl:    Omega-3 Fatty Acids (FISH OIL CONCENTRATE PO), Take by mouth., Disp: , Rfl:    rosuvastatin (CRESTOR) 5 MG tablet, TAKE 1 TABLET BY MOUTH DAILY, Disp: 90 tablet, Rfl: 3   Medications ordered in this encounter:  Meds ordered this encounter  Medications   nirmatrelvir/ritonavir EUA (PAXLOVID) 20 x 150 MG & 10 x 100MG  TABS    Sig: Take 3 tablets by mouth 2 (two) times daily for 5 days. (Take nirmatrelvir 150 mg two tablets twice daily for 5 days and ritonavir 100 mg one tablet twice daily for 5 days) Patient GFR is 79    Dispense:  30 tablet    Refill:  0    Order Specific Question:   Supervising  Provider    Answer:   Sabra Heck, BRIAN [[id number]]   benzonatate (TESSALON) 100 MG capsule    Sig: Take 1 capsule (100 mg total) by mouth 2 (two) times daily as needed for cough.    Dispense:  20 capsule    Refill:  0    Order Specific Question:   Supervising Provider    Answer:   Sabra Heck, [place]     *If you need refills on other medications prior to your next appointment, please contact your pharmacy*  Follow-Up: Call back or seek an in-person evaluation if the symptoms worsen or if the condition fails to improve as anticipated.  Other Instructions COVID test was positive You should remain isolated in your home for 5 days from symptom onset AND greater than 72 hours after symptoms resolution (absence of fever without the use of fever-reducing medication and improvement in respiratory symptoms), whichever is longer Get plenty of rest and push fluids Paxlovid prescribed.  Take as directed and to completion Tessalon prescribed for cough Use zyrtec for nasal congestion, runny nose, and/or sore throat Use flonase for nasal congestion and runny nose Use medications daily for symptom relief Use OTC medications like ibuprofen or tylenol as needed fever or pain Follow up with PCP in 1-2 days via phone or e-visit for recheck and to ensure symptoms are  improving Call or go to the ED if you have any new or worsening symptoms such as fever, worsening cough, shortness of breath, chest tightness, chest pain, turning blue, changes in mental status, etc...     If you have been instructed to have an in-person evaluation today at a local Urgent Care facility, please use the link below. It will take you to a list of all of our available [facility], including address, phone number and hours of operation. Please do not delay care.  [facility]  If you or a family member do not have a primary care provider, use the link below to schedule a visit and establish care. When you choose a  [facility] primary care physician or advanced practice provider, you gain a long-term partner in health. Find a Primary Care Provider  Learn more about 's in-office and virtual care options: [facility]

## 2021-03-11 NOTE — Progress Notes (Signed)
Virtual Visit Consent   Mike Perez, you are scheduled for a virtual visit with a [facility] provider today.     Just as with appointments in the office, your consent must be obtained to participate.  Your consent will be active for this visit and any virtual visit you may have with one of our providers in the next 365 days.     If you have a MyChart account, a copy of this consent can be sent to you electronically.  All virtual visits are billed to your insurance company just like a traditional visit in the office.    As this is a virtual visit, video technology does not allow for your provider to perform a traditional examination.  This may limit your provider's ability to fully assess your condition.  If your provider identifies any concerns that need to be evaluated in person or the need to arrange testing (such as labs, EKG, etc.), we will make arrangements to do so.     Although advances in technology are sophisticated, we cannot ensure that it will always work on either your end or our end.  If the connection with a video visit is poor, the visit may have to be switched to a telephone visit.  With either a video or telephone visit, we are not always able to ensure that we have a secure connection.     I need to obtain your verbal consent now.   Are you willing to proceed with your visit today?    Mike Perez has provided verbal consent on 03/11/2021 for a virtual visit (video or telephone).   Mike Perez, [place]   Date: 03/11/2021 1:43 PM   Virtual Visit via Video Note   I, Mike Perez, connected with  Mike Perez  ([id number], May 28, 1960) on 03/11/21 at  1:30 PM EDT by a video-enabled telemedicine application and verified that I am speaking with the correct person using two identifiers.  Location: Patient: Virtual Visit Location Patient: Home Provider: Virtual Visit Location Provider: Home Office   I discussed the limitations of evaluation and management by  telemedicine and the availability of in person appointments. The patient expressed understanding and agreed to proceed.    History of Present Illness: Mike Perez is a 60 y.o. who identifies as a male who was assigned male at birth, and is being seen today for fever, fatigue ,cough, runny nose, congestion, and headache x 2 days.  Reports positive exposure to covid at work.  Has tried OTC medications without relief.  Denies aggravating factors.  Denies previous covid infection.  Denies SOB, wheezing, chest pain, nausea, changes in bowel or bladder habits.     ROS: As per HPI.  All other pertinent ROS negative.     HPI: HPI  Problems:  Patient Active Problem List   Diagnosis Date Noted   Central serous retinopathy 02/07/2021   Primary osteoarthritis of right hip 10/30/2019   Primary osteoarthritis of left hip 10/30/2019   Tendinosis of rotator cuff 10/30/2019   Tendinopathy of right biceps tendon 10/30/2019   Hx of adenomatous polyp of colon 07/08/2014   Prediabetes 04/07/2013   Low back pain     Allergies:  Allergies  Allergen Reactions   Niaspan [Niacin Er]     Severe flushing   Medications:  Current Outpatient Medications:    benzonatate (TESSALON) 100 MG capsule, Take 1 capsule (100 mg total) by mouth 2 (two) times daily as needed for cough., Disp: 20 capsule,  Rfl: 0   nirmatrelvir/ritonavir EUA (PAXLOVID) 20 x 150 MG & 10 x 100MG  TABS, Take 3 tablets by mouth 2 (two) times daily for 5 days. (Take nirmatrelvir 150 mg two tablets twice daily for 5 days and ritonavir 100 mg one tablet twice daily for 5 days) Patient GFR is 79, Disp: 30 tablet, Rfl: 0   aspirin 81 MG tablet, Take 81 mg by mouth daily., Disp: , Rfl:    calcium carbonate (CALCIUM 600) 600 MG TABS tablet, Take 1 tablet (600 mg total) by mouth 2 (two) times daily with a meal., Disp: 30 tablet, Rfl:    Cholecalciferol (VITAMIN D3) 50 MCG (2000 UT) capsule, Take 1 capsule (2,000 Units total) by mouth daily., Disp: ,  Rfl:    Multiple Vitamin (MULTIVITAMIN) tablet, Take 1 tablet by mouth daily., Disp: , Rfl:    Omega-3 Fatty Acids (FISH OIL CONCENTRATE PO), Take by mouth., Disp: , Rfl:    rosuvastatin (CRESTOR) 5 MG tablet, TAKE 1 TABLET BY MOUTH DAILY, Disp: 90 tablet, Rfl: 3  Observations/Objective: Patient is well-developed, well-nourished in no acute distress.  Resting comfortably at home. Appears fatigued, laying in bed during video visit assessment Head is normocephalic, atraumatic.  No labored breathing. Speaking in full sentences, and tolerating own secretions Speech is clear and coherent with logical content.  Patient is alert and oriented at baseline.   Assessment and Plan: 1. COVID-19 virus infection  2. Flu-like symptoms  3. Acute cough COVID test was positive You should remain isolated in your home for 5 days from symptom onset AND greater than 72 hours after symptoms resolution (absence of fever without the use of fever-reducing medication and improvement in respiratory symptoms), whichever is longer Get plenty of rest and push fluids Paxlovid prescribed.  Take as directed and to completion Tessalon prescribed for cough Use zyrtec for nasal congestion, runny nose, and/or sore throat Use flonase for nasal congestion and runny nose Use medications daily for symptom relief Use OTC medications like ibuprofen or tylenol as needed fever or pain Follow up with PCP in 1-2 days via phone or e-visit for recheck and to ensure symptoms are improving Call or go to the ED if you have any new or worsening symptoms such as fever, worsening cough, shortness of breath, chest tightness, chest pain, turning blue, changes in mental status, etc...    Follow Up Instructions: I discussed the assessment and treatment plan with the patient. The patient was provided an opportunity to ask questions and all were answered. The patient agreed with the plan and demonstrated an understanding of the instructions.  A  copy of instructions were sent to the patient via MyChart unless otherwise noted below.   The patient was advised to call back or seek an in-person evaluation if the symptoms worsen or if the condition fails to improve as anticipated.  Time:  I spent 10 minutes with the patient via telehealth technology discussing the above problems/concerns.    Mike Box, PA-C

## 2021-03-17 ENCOUNTER — Encounter (HOSPITAL_COMMUNITY): Payer: 59

## 2021-03-17 ENCOUNTER — Other Ambulatory Visit: Payer: 59

## 2021-03-21 ENCOUNTER — Ambulatory Visit (INDEPENDENT_AMBULATORY_CARE_PROVIDER_SITE_OTHER): Payer: 59 | Admitting: Family Medicine

## 2021-03-21 ENCOUNTER — Ambulatory Visit: Payer: 59 | Admitting: Physical Therapy

## 2021-03-21 ENCOUNTER — Other Ambulatory Visit: Payer: Self-pay

## 2021-03-21 DIAGNOSIS — Z23 Encounter for immunization: Secondary | ICD-10-CM

## 2021-03-21 DIAGNOSIS — M25651 Stiffness of right hip, not elsewhere classified: Secondary | ICD-10-CM

## 2021-03-21 DIAGNOSIS — M6281 Muscle weakness (generalized): Secondary | ICD-10-CM | POA: Diagnosis not present

## 2021-03-21 DIAGNOSIS — M25652 Stiffness of left hip, not elsewhere classified: Secondary | ICD-10-CM

## 2021-03-21 DIAGNOSIS — M79605 Pain in left leg: Secondary | ICD-10-CM

## 2021-03-21 DIAGNOSIS — M79604 Pain in right leg: Secondary | ICD-10-CM

## 2021-03-21 NOTE — Progress Notes (Signed)
After obtaining consent, and per orders of Dr. Dennard Schaumann, injection of influenza vaccine given by Marrian Salvage. Patient instructed to remain in clinic for 20 minutes afterwards, and to report any adverse reaction to me immediately.

## 2021-03-21 NOTE — Patient Instructions (Signed)
Access Code: [email or web address] URL: [email or web address] Date: 03/21/2021 Prepared by: Elsie Ra  Exercises Seated Piriformis Stretch - 2 x daily - 6 x weekly - 1 sets - 3 reps - 30 hold Supine Hamstring Stretch with Strap - 2 x daily - 6 x weekly - 1 sets - 3 reps - 30 hold Supine Quadriceps Stretch with Strap on Table - 2 x daily - 6 x weekly - 3 reps - 30 hold Hooklying Single Knee to Chest Stretch - 2 x daily - 6 x weekly - 1 sets - 2 reps - 20 hold Figure 4 Bridge - 2 x daily - 6 x weekly - 1-2 sets - 10 reps Standing Hip Internal Rotation Stretch on Step - 2 x daily - 6 x weekly - 1 sets - 10 reps - 10 sec hold Squat with Chair Touch - 2 x daily - 6 x weekly - 2-3 sets - 10 reps Partial Lunge with Chair - 2 x daily - 6 x weekly - 1 sets - 10 reps

## 2021-03-21 NOTE — Therapy (Addendum)
[facility] Physical Therapy [address] [place], [place], [address] Phone: [phone]   Fax:  [phone]  Physical Therapy Evaluation/Discharge PHYSICAL THERAPY DISCHARGE SUMMARY  Visits from Start of Care: 1  Current functional level related to goals / functional outcomes: See note   Remaining deficits: See note   Education / Equipment: HEP  Plan: Patient is being discharged due to not returning to PT.  Elsie Ra, PT, DPT 10/24/21 10:53 AM     Patient Details  Name: Mike Perez MRN: [id number] Date of Birth: 08/29/1960 Referring Provider (PT): Mcarthur Rossetti, MD   Encounter Date: 03/21/2021   PT End of Session - 03/21/21 0935     Visit Number 1    Number of Visits 10    Date for PT Re-Evaluation 05/16/21    PT Start Time 0845    PT Stop Time 0925    PT Time Calculation (min) 40 min    Activity Tolerance Patient tolerated treatment well    Behavior During Therapy [facility] for tasks assessed/performed             Past Medical History:  Diagnosis Date   Hx of adenomatous polyp of colon 07/08/2014   Hyperlipidemia    Low back pain     Past Surgical History:  Procedure Laterality Date   COLONOSCOPY  09-17-2003   normal dr Wynetta Emery   HERNIA REPAIR      There were no vitals filed for this visit.    Subjective Assessment - 03/21/21 0849     Subjective He is here for bilateral leg and hip pain, weakness, stiffess. 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 MRI of his back has been ordered to investigate possible lumbar radiculopathy. He is not having much pain but he is having weakness with squatting down and trying to get back up or with stairs.    Diagnostic tests lumbar MRI has been ordered for 03/31/21, "The x-rays of his pelvis some right hip shows severe end-stage arthritis with bone-on-bone wear and complete loss of joint space.  There is significant degenerative  changes also in the lower aspect of the lumbar spine especially at L5-S1.  The knee joint space on his right knee is well-maintained."  PMH: severe lumbar and hip OA    Patient Stated Goals improve leg strength    Currently in Pain? Yes    Pain Score --   no pain at rest up to 8/10 squatting   Pain Location Leg    Pain Orientation Right;Left    Pain Descriptors / Indicators --   feels like it will tear   Pain Type Chronic pain   one year of pain   Pain Radiating Towards denies N/T or burning but says its pain from hips down to his calf    Pain Onset More than a month ago    Pain Frequency Intermittent    Aggravating Factors  squatting, kneeling, stairs    Pain Relieving Factors rest, sitting with legs crossed                [facility] PT Assessment - 03/21/21 0001       Assessment   Medical Diagnosis bilat leg pain and hip OA    Referring Provider (PT) Mcarthur Rossetti, MD    Onset Date/Surgical Date --   one year onset of pain   Next MD Visit 04/19/21  Precautions   Precautions None      Balance Screen   Has the patient fallen in the past 6 months No    Has the patient had a decrease in activity level because of a fear of falling?  No    Is the patient reluctant to leave their home because of a fear of falling?  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      Cognition   Overall Cognitive Status Within Functional Limits for tasks assessed      Observation/Other Assessments   Focus on Therapeutic Outcomes (FOTO)  50% functional, goal 66%      Functional Tests   Functional tests Lunges;Squat      Squat   Comments more weight shift onto Rt and difficulty rising with pain at bottom of squat      Lunges   Comments he can get down to floor  but gets pain at lower range and difficulty rising requiring UE support      ROM / Strength   AROM / PROM / Strength AROM;PROM;Strength      AROM   Overall AROM Comments lumbar ROM about 75% all planes, bilateral hip flexion limited to 90 deg      PROM   PROM Assessment Site Hip    Right/Left Hip Right;Left    Right Hip Flexion 95    Right Hip External Rotation  20    Right Hip Internal Rotation  10    Left Hip Flexion 100      Strength   Overall Strength Comments 5/5 bilateral knee and hip strength except hip extension 4/5 bilat      Flexibility   Soft Tissue Assessment /Muscle Length --   very tight hamstrings, quads, hip rotators bilat, Rt more tight than left     Palpation   Palpation comment denies pain or tenderness to palpation but does have very stiff hip and lumbar mobillity      Special Tests   Other special tests negative SLR test bilat, negative slump test bilat, negative lumbar quadrant test, denies pain with FABER and FADIR but very stiff with these and reports tightness      Transfers   Transfers Independent with all Transfers      Ambulation/Gait   Gait Comments WNL                        Objective measurements completed on examination: See above findings.                PT Education - 03/21/21 0935     Education Details HEP,POC    Methods Explanation;Demonstration;Verbal cues;Handout    Comprehension Verbalized understanding;Returned demonstration;Need further instruction              PT Short Term Goals - 03/21/21 0941       PT SHORT TERM GOAL #1   Title Pt will be I and compliant with HEP    Time 4    Period Weeks    Status New               PT Long Term Goals - 03/21/21 0942       PT LONG TERM  GOAL #1   Title He will improve FOTO functional score to 66%    Time 8    Period Weeks    Status New    Target Date 05/16/21      PT LONG TERM GOAL #2   Title He will improve hip extension strength to 5/5  MMT to improve functional strength.    Time 8    Period Weeks    Status New    Target Date 05/16/21      PT LONG TERM GOAL #3   Title He will report less overall pain with squatting, kneeling, stairs, and work activity    Time 8    Period Weeks    Status New    Target Date 05/16/21                    Plan - 03/21/21 0936     Clinical Impression Statement Pt presents with chronic bilat leg pain and stiffness for over a year. He has severe hip OA and will have upcoming lumbar MRI 03/31/21. He will benefit from skilled PT to address his functional deficits and difficulty with squatting, stairs, and kneeling. I recommended he trial HEP for now and after MRI results to call PT back to schedule.    Personal Factors and Comorbidities Comorbidity 1    Comorbidities PMH: severe lumbar and hip OA    Examination-Activity Limitations Bend;Squat;Stairs;Stand;Lift    Examination-Participation Restrictions Occupation;Yard Work    Merchant navy officer Evolving/Moderate complexity    Clinical Decision Making Moderate    Rehab Potential Good    PT Frequency 2x / week   1-2   PT Duration 8 weeks    PT Treatment/Interventions ADLs/Self Care Home Management;Cryotherapy;Electrical Stimulation;Moist Heat;Traction;Ultrasound;Therapeutic activities;Therapeutic exercise;Neuromuscular re-education;Manual techniques;Passive range of motion;Dry needling;Joint Manipulations;Spinal Manipulations;Taping    PT Next Visit Plan what are MRI results, how is HEP?    PT Home Exercise Plan Access Code: [email or web address]    Consulted and Agree with Plan of Care Patient             Patient will benefit from skilled therapeutic intervention in order to improve the following deficits and impairments:  Decreased activity tolerance, Decreased mobility, Decreased range of motion, Decreased strength, Difficulty walking, Impaired flexibility, Pain  Visit Diagnosis: Pain in right leg  Pain in left  leg  Muscle weakness (generalized)  Stiffness of right hip, not elsewhere classified  Stiffness of left hip, not elsewhere classified     Problem List Patient Active Problem List   Diagnosis Date Noted   Central serous retinopathy 02/07/2021   Primary osteoarthritis of right hip 10/30/2019   Primary osteoarthritis of left hip 10/30/2019   Tendinosis of rotator cuff 10/30/2019   Tendinopathy of right biceps tendon 10/30/2019   Hx of adenomatous polyp of colon 07/08/2014   Prediabetes 04/07/2013   Low back pain     Debbe Odea, PT,DPT 03/21/2021, 9:47 AM  [facility] Physical Therapy [address] [place], [place], [address] Phone: [phone]   Fax:  [phone]  Name: Mike Perez MRN: [id number] Date of Birth: 07-26-1960

## 2021-03-31 ENCOUNTER — Other Ambulatory Visit: Payer: Self-pay

## 2021-03-31 ENCOUNTER — Ambulatory Visit
Admission: RE | Admit: 2021-03-31 | Discharge: 2021-03-31 | Disposition: A | Discharge status: Home / Self Care | Payer: 59 | Source: Ambulatory Visit | Attending: Family Medicine | Admitting: Family Medicine

## 2021-03-31 ENCOUNTER — Ambulatory Visit (HOSPITAL_COMMUNITY)
Admission: RE | Admit: 2021-03-31 | Discharge: 2021-03-31 | Disposition: A | Discharge status: Home / Self Care | Payer: 59 | Source: Ambulatory Visit | Attending: Cardiology | Admitting: Cardiology

## 2021-03-31 DIAGNOSIS — R109 Unspecified abdominal pain: Secondary | ICD-10-CM

## 2021-03-31 DIAGNOSIS — M25562 Pain in left knee: Secondary | ICD-10-CM | POA: Diagnosis not present

## 2021-03-31 DIAGNOSIS — M25561 Pain in right knee: Secondary | ICD-10-CM | POA: Diagnosis not present

## 2021-03-31 DIAGNOSIS — M545 Low back pain, unspecified: Secondary | ICD-10-CM

## 2021-03-31 DIAGNOSIS — M47816 Spondylosis without myelopathy or radiculopathy, lumbar region: Secondary | ICD-10-CM | POA: Diagnosis not present

## 2021-03-31 DIAGNOSIS — M5127 Other intervertebral disc displacement, lumbosacral region: Secondary | ICD-10-CM | POA: Diagnosis not present

## 2021-03-31 DIAGNOSIS — K769 Liver disease, unspecified: Secondary | ICD-10-CM | POA: Diagnosis not present

## 2021-03-31 DIAGNOSIS — M48061 Spinal stenosis, lumbar region without neurogenic claudication: Secondary | ICD-10-CM | POA: Diagnosis not present

## 2021-03-31 DIAGNOSIS — K429 Umbilical hernia without obstruction or gangrene: Secondary | ICD-10-CM | POA: Diagnosis not present

## 2021-03-31 MED ORDER — IOPAMIDOL (ISOVUE-300) INJECTION 61%
100.0000 mL | Freq: Once | INTRAVENOUS | Status: AC | PRN
  Administered 2021-03-31: 100 mL via INTRAVENOUS

## 2021-04-03 ENCOUNTER — Encounter: Payer: Self-pay | Admitting: Family Medicine

## 2021-04-03 DIAGNOSIS — M2550 Pain in unspecified joint: Secondary | ICD-10-CM

## 2021-04-04 ENCOUNTER — Other Ambulatory Visit (INDEPENDENT_AMBULATORY_CARE_PROVIDER_SITE_OTHER): Payer: 59

## 2021-04-04 ENCOUNTER — Other Ambulatory Visit: Payer: Self-pay

## 2021-04-04 DIAGNOSIS — M2550 Pain in unspecified joint: Secondary | ICD-10-CM

## 2021-04-04 LAB — CBC WITH DIFFERENTIAL/PLATELET
Basophils Absolute: 0 10*3/uL (ref 0.0–0.1)
Basophils Relative: 1 % (ref 0.0–3.0)
Eosinophils Absolute: 0.3 10*3/uL (ref 0.0–0.7)
Eosinophils Relative: 6.3 % — ABNORMAL HIGH (ref 0.0–5.0)
HCT: 42.7 % (ref 39.0–52.0)
Hemoglobin: 14.1 g/dL (ref 13.0–17.0)
Lymphocytes Relative: 26.2 % (ref 12.0–46.0)
Lymphs Abs: 1.3 10*3/uL (ref 0.7–4.0)
MCHC: 32.9 g/dL (ref 30.0–36.0)
MCV: 90.6 fl (ref 78.0–100.0)
Monocytes Absolute: 0.5 10*3/uL (ref 0.1–1.0)
Monocytes Relative: 9.8 % (ref 3.0–12.0)
Neutro Abs: 2.7 10*3/uL (ref 1.4–7.7)
Neutrophils Relative %: 56.7 % (ref 43.0–77.0)
Platelets: 187 10*3/uL (ref 150.0–400.0)
RBC: 4.72 Mil/uL (ref 4.22–5.81)
RDW: 13.8 % (ref 11.5–15.5)
WBC: 4.8 10*3/uL (ref 4.0–10.5)

## 2021-04-04 LAB — COMPREHENSIVE METABOLIC PANEL
ALT: 15 U/L (ref 0–53)
AST: 19 U/L (ref 0–37)
Albumin: 4.5 g/dL (ref 3.5–5.2)
Alkaline Phosphatase: 49 U/L (ref 39–117)
BUN: 21 mg/dL (ref 6–23)
CO2: 28 mEq/L (ref 19–32)
Calcium: 9.4 mg/dL (ref 8.4–10.5)
Chloride: 104 mEq/L (ref 96–112)
Creatinine, Ser: 0.74 mg/dL (ref 0.40–1.50)
GFR: 99.01 mL/min (ref >60.00)
Glucose, Bld: 71 mg/dL (ref 70–99)
Potassium: 4.3 mEq/L (ref 3.5–5.1)
Sodium: 141 mEq/L (ref 135–145)
Total Bilirubin: 0.9 mg/dL (ref 0.2–1.2)
Total Protein: 6.8 g/dL (ref 6.0–8.3)

## 2021-04-04 LAB — T3, FREE: T3, Free: 3.4 pg/mL (ref 2.3–4.2)

## 2021-04-04 LAB — T4, FREE: Free T4: 0.89 ng/dL (ref 0.60–1.60)

## 2021-04-04 LAB — TSH: TSH: 1.88 u[IU]/mL (ref 0.35–5.50)

## 2021-04-04 LAB — TESTOSTERONE: Testosterone: 219.94 ng/dL — ABNORMAL LOW (ref 300.00–890.00)

## 2021-04-04 LAB — CORTISOL: Cortisol, Plasma: 8.3 ug/dL

## 2021-04-04 NOTE — Addendum Note (Signed)
Addended by: Vilma Meckel R on: 04/04/2021 07:31 AM   Modules accepted: Orders

## 2021-04-06 ENCOUNTER — Encounter: Payer: Self-pay | Admitting: Family Medicine

## 2021-04-07 ENCOUNTER — Other Ambulatory Visit: Payer: Self-pay

## 2021-04-07 DIAGNOSIS — R7989 Other specified abnormal findings of blood chemistry: Secondary | ICD-10-CM

## 2021-04-07 DIAGNOSIS — I872 Venous insufficiency (chronic) (peripheral): Secondary | ICD-10-CM

## 2021-04-11 ENCOUNTER — Ambulatory Visit: Payer: 59 | Admitting: Family Medicine

## 2021-04-13 LAB — DHEA: DHEA: 120 ng/dL — ABNORMAL LOW (ref 147–1760)

## 2021-04-14 ENCOUNTER — Encounter: Payer: Self-pay | Admitting: Family Medicine

## 2021-04-19 ENCOUNTER — Encounter: Payer: Self-pay | Admitting: Orthopaedic Surgery

## 2021-04-19 ENCOUNTER — Ambulatory Visit (INDEPENDENT_AMBULATORY_CARE_PROVIDER_SITE_OTHER): Payer: 59 | Admitting: Orthopaedic Surgery

## 2021-04-19 DIAGNOSIS — M79604 Pain in right leg: Secondary | ICD-10-CM

## 2021-04-19 DIAGNOSIS — M1611 Unilateral primary osteoarthritis, right hip: Secondary | ICD-10-CM

## 2021-04-19 DIAGNOSIS — M79605 Pain in left leg: Secondary | ICD-10-CM | POA: Diagnosis not present

## 2021-04-19 NOTE — Progress Notes (Signed)
The patient comes in today to go over MRI of his lumbar spine.  He is well-known to [place].  He has actually well-documented severe arthritis in his right hip but does not really have any significant hip pain or groin pain on the right side.  He does have hip stiffness.  He says his biggest issue is going from a squatting to standing up position.  He has had at least one physical therapy session.  We wanted them to work on his back as well as quad strengthening exercises.  He said they designed a home exercise program for him but he has not been significantly diligent with participating in that program.  He comes for review of a MRI of the lumbar spine today.  He reports bilateral leg pain.  He did report that he has had recent blood flow study of his legs showing that he may have some issues with his valves he states.  This sounds to me like he would benefit from either compressive garments.  His right hip does have stiffness with internal and external rotation but no pain.  His left hip moves smoothly.  He has negative straight leg raise bilaterally good strength in his bilateral extremities.  The MRI of his lumbar spine shows degenerative changes at multiple levels especially at L5-S1 but no significant neural compression.  From my standpoint the only thing that I would recommend is continue therapy and home exercise program and core strengthening as well as bilateral extremity exercises.  If his right hip starts to become symptomatic he knows to let us know.  All question concerns were answered and addressed.  I have no further recommendations for now.

## 2021-04-23 ENCOUNTER — Other Ambulatory Visit: Payer: Self-pay

## 2021-04-23 DIAGNOSIS — I872 Venous insufficiency (chronic) (peripheral): Secondary | ICD-10-CM

## 2021-04-25 ENCOUNTER — Other Ambulatory Visit (HOSPITAL_COMMUNITY): Payer: Self-pay

## 2021-04-25 ENCOUNTER — Ambulatory Visit: Payer: 59 | Admitting: Family Medicine

## 2021-04-25 MED FILL — Rosuvastatin Calcium Tab 5 MG: ORAL | 90 days supply | Qty: 90 | Fill #2 | Status: AC

## 2021-04-26 ENCOUNTER — Ambulatory Visit: Payer: 59 | Admitting: Family Medicine

## 2021-04-27 ENCOUNTER — Encounter (HOSPITAL_COMMUNITY): Payer: 59

## 2021-04-27 ENCOUNTER — Encounter: Payer: 59 | Admitting: Vascular Surgery

## 2021-05-02 ENCOUNTER — Ambulatory Visit: Payer: 59 | Admitting: Family Medicine

## 2021-05-11 ENCOUNTER — Encounter: Payer: Self-pay | Admitting: Family Medicine

## 2021-05-30 ENCOUNTER — Ambulatory Visit: Payer: 59 | Admitting: Family Medicine

## 2021-05-30 ENCOUNTER — Other Ambulatory Visit: Payer: Self-pay

## 2021-05-30 ENCOUNTER — Encounter: Payer: Self-pay | Admitting: Family Medicine

## 2021-05-30 VITALS — BP 118/78 | HR 77 | Temp 97.7°F | Resp 18 | Ht 73.0 in | Wt 214.0 lb

## 2021-05-30 DIAGNOSIS — K644 Residual hemorrhoidal skin tags: Secondary | ICD-10-CM | POA: Diagnosis not present

## 2021-05-30 MED ORDER — HYDROCORTISONE 2.5 % EX CREA: TOPICAL_CREAM | Freq: Two times a day (BID) | CUTANEOUS | 2 refills | Status: DC

## 2021-05-30 NOTE — Progress Notes (Signed)
Subjective:    [name], male    DOB: 12-28-1960, 61 y.o.   MRN: [id number]  HPI Patient states that he is been dealing with a hemorrhoid for about 3 weeks.  He denies any pain.  He denies any itching.  He denies any burning.  He denies any pain with defecation.  However he was felt a swollen lump around his rectum.  I performed a rectal exam today.  With patient standing leaning over the exam table, there is a 1 cm external hemorrhoid with no evidence of thrombosis.  There is no tenderness, no bleeding. Past Medical History:  Diagnosis Date   Hx of adenomatous polyp of colon 07/08/2014   Hyperlipidemia    Low back pain    Past Surgical History:  Procedure Laterality Date   COLONOSCOPY  09-17-2003   normal dr Wynetta Emery   HERNIA REPAIR     Current Outpatient Medications on File Prior to Visit  Medication Sig Dispense Refill   aspirin 81 MG tablet Take 81 mg by mouth daily.     benzonatate (TESSALON) 100 MG capsule Take 1 capsule (100 mg total) by mouth 2 (two) times daily as needed for cough. 20 capsule 0   calcium carbonate (CALCIUM 600) 600 MG TABS tablet Take 1 tablet (600 mg total) by mouth 2 (two) times daily with a meal. 30 tablet    Cholecalciferol (VITAMIN D3) 50 MCG (2000 UT) capsule Take 1 capsule (2,000 Units total) by mouth daily.     Multiple Vitamin (MULTIVITAMIN) tablet Take 1 tablet by mouth daily.     Omega-3 Fatty Acids (FISH OIL CONCENTRATE PO) Take by mouth.     rosuvastatin (CRESTOR) 5 MG tablet TAKE 1 TABLET BY MOUTH DAILY 90 tablet 3   No current facility-administered medications on file prior to visit.   Allergies  Allergen Reactions   Niaspan [Niacin Er]     Severe flushing   Social History   Socioeconomic History   Marital status: Married    Spouse name: Not on file   Number of children: Not on file   Years of education: Not on file   Highest education level: Not on file  Occupational History   Not on file  Tobacco Use   Smoking  status: Never   Smokeless tobacco: Never  Substance and Sexual Activity   Alcohol use: No    Alcohol/week: 0.0 standard drinks   Drug use: No   Sexual activity: Yes    Comment: married.  Works in Therapist, occupational, Psychologist, educational  Other Topics Concern   Not on file  Social History Narrative   Not on file   Social Determinants of Health   Financial Resource Strain: Not on file  Food Insecurity: Not on file  Transportation Needs: Not on file  Physical Activity: Not on file  Stress: Not on file  Social Connections: Not on file  Intimate Partner Violence: Not on file      Review of Systems  All other systems reviewed and are negative.     Objective:   Physical Exam Vitals reviewed.  HENT:     Head: Normocephalic and atraumatic.     Right Ear: Hearing normal.     Left Ear: Hearing normal.     Nose:     Right Turbinates: Not swollen.     Left Turbinates: Not swollen.     Right Sinus: No maxillary sinus tenderness or frontal sinus tenderness.     Left Sinus: No maxillary  sinus tenderness or frontal sinus tenderness.  Cardiovascular:     Rate and Rhythm: Normal rate and regular rhythm.     Heart sounds: Normal heart sounds.  Pulmonary:     Effort: Pulmonary effort is normal.     Breath sounds: Normal breath sounds.  Abdominal:     General: Bowel sounds are normal. There is no distension.     Palpations: Abdomen is soft.     Tenderness: There is no abdominal tenderness. There is no guarding or rebound.     Hernia: There is no hernia in the left inguinal area.  Genitourinary:    Rectum: External hemorrhoid present. No tenderness, anal fissure or internal hemorrhoid.    Musculoskeletal:     Cervical back: Neck supple.          Assessment & Plan:  External hemorrhoid Patient has a nonthrombosed external hemorrhoid.  I explained the cause of this.  I explained that it is swollen varicose vein.  I will use 2.5% hydrocortisone cream applied 2-3 times per day to the  affected area.  There is no role for lancing his hemorrhoid as there is no obvious thrombosis.

## 2021-06-07 ENCOUNTER — Encounter: Payer: Self-pay | Admitting: Vascular Surgery

## 2021-06-07 ENCOUNTER — Ambulatory Visit (INDEPENDENT_AMBULATORY_CARE_PROVIDER_SITE_OTHER): Payer: 59 | Admitting: Vascular Surgery

## 2021-06-07 ENCOUNTER — Other Ambulatory Visit: Payer: Self-pay

## 2021-06-07 ENCOUNTER — Ambulatory Visit (HOSPITAL_COMMUNITY)
Admission: RE | Admit: 2021-06-07 | Discharge: 2021-06-07 | Disposition: A | Discharge status: Home / Self Care | Payer: 59 | Source: Ambulatory Visit | Attending: Vascular Surgery | Admitting: Vascular Surgery

## 2021-06-07 VITALS — BP 116/77 | HR 78 | Temp 98.8°F | Resp 16 | Ht 73.0 in | Wt 220.0 lb

## 2021-06-07 DIAGNOSIS — I872 Venous insufficiency (chronic) (peripheral): Secondary | ICD-10-CM

## 2021-06-07 DIAGNOSIS — I83811 Varicose veins of right lower extremities with pain: Secondary | ICD-10-CM | POA: Diagnosis not present

## 2021-06-07 NOTE — Progress Notes (Signed)
ASSESSMENT & PLAN   CHRONIC VENOUS INSUFFICIENCY: This patient has painful varicose veins of the right lower extremity with both deep and superficial venous reflux.  He has CEAP C2 venous disease.  We have discussed the importance of intermittent leg elevation and the proper positioning for this.  In addition we discussed the use of compression stockings.  He would like to start with a knee-high stocking which I think is reasonable.  We have also discussed the importance of exercise specifically walking and water aerobics.  I have encouraged him to avoid prolonged sitting and standing.  If his symptoms progress I think he should be fitted for some thigh-high compression stockings with a gradient of 20 to 30 mmHg.  If his symptoms persist despite that I think he would be a candidate for laser ablation of the right small saphenous vein which extends up the thigh as a vein of Giacomini.  There is no connection to the popliteal vein although the vein is close enough to the popliteal vein that there would still be some risk of DVT and it would be important to clearly separate the 2 with tumescent anesthesia.  Regardless, currently his symptoms are tolerable and he would like to try these conservative measures first which I think is perfectly acceptable.  He will call if his symptoms progress.  REASON FOR CONSULT:    Chronic venous insufficiency.  The consult is requested by Dr. Hulan Saas.  HPI:   Mike Perez is a 61 y.o. male who presents with chronic venous insufficiency.  He has had pain in both legs for many years, however his symptoms in the right leg especially increased in the summer 2022.  His symptoms are aggravated by sitting and being inactive and are relieved with ambulation.  I think the symptoms could be consistent with symptoms from venous hypertension.  He apparently has some arthritis in his hips but denies any hip pain.  I do not get any history of claudication or rest pain.  He  denies any previous history of DVT.  He denies any previous venous procedures.  He does not wear compression stockings.  He does not elevate his legs.  Past Medical History:  Diagnosis Date   Hx of adenomatous polyp of colon 07/08/2014   Hyperlipidemia    Low back pain     Family History  Problem Relation Age of Onset   Alcohol abuse Brother    Hyperlipidemia Brother    Diabetes Father    Colon cancer Maternal Grandmother    Esophageal cancer Neg Hx    Rectal cancer Neg Hx    Stomach cancer Neg Hx     SOCIAL HISTORY: Social History   Tobacco Use   Smoking status: Never   Smokeless tobacco: Never  Substance Use Topics   Alcohol use: No    Alcohol/week: 0.0 standard drinks    Allergies  Allergen Reactions   Niaspan [Niacin Er]     Severe flushing    Current Outpatient Medications  Medication Sig Dispense Refill   aspirin 81 MG tablet Take 81 mg by mouth daily.     calcium carbonate (CALCIUM 600) 600 MG TABS tablet Take 1 tablet (600 mg total) by mouth 2 (two) times daily with a meal. 30 tablet    Cholecalciferol (VITAMIN D3) 50 MCG (2000 UT) capsule Take 1 capsule (2,000 Units total) by mouth daily.     hydrocortisone 2.5 % cream Apply topically 2 (two) times daily. 30 g 2  Multiple Vitamin (MULTIVITAMIN) tablet Take 1 tablet by mouth daily.     Omega-3 Fatty Acids (FISH OIL CONCENTRATE PO) Take by mouth.     rosuvastatin (CRESTOR) 5 MG tablet TAKE 1 TABLET BY MOUTH DAILY 90 tablet 3   No current facility-administered medications for this visit.    REVIEW OF SYSTEMS:  [X]  denotes positive finding, [ ]  denotes negative finding Cardiac  Comments:  Chest pain or chest pressure:    Shortness of breath upon exertion:    Short of breath when lying flat:    Irregular heart rhythm:        Vascular    Pain in calf, thigh, or hip brought on by ambulation:    Pain in feet at night that wakes you up from your sleep:     Blood clot in your veins:    Leg swelling:          Pulmonary    Oxygen at home:    Productive cough:     Wheezing:         Neurologic    Sudden weakness in arms or legs:     Sudden numbness in arms or legs:     Sudden onset of difficulty speaking or slurred speech:    Temporary loss of vision in one eye:     Problems with dizziness:         Gastrointestinal    Blood in stool:     Vomited blood:         Genitourinary    Burning when urinating:     Blood in urine:        Psychiatric    Major depression:         Hematologic    Bleeding problems:    Problems with blood clotting too easily:        Skin    Rashes or ulcers:        Constitutional    Fever or chills:    -  PHYSICAL EXAM:   Vitals:   06/07/21 1340  BP: 116/77  Pulse: 78  Resp: 16  Temp: 98.8 F (37.1 C)  TempSrc: Temporal  SpO2: 96%  Weight: 220 lb (99.8 kg)  Height: 6\' 1"  (1.854 m)   Body mass index is 29.03 kg/m. GENERAL: The patient is a well-nourished male, in no acute distress. The vital signs are documented above. CARDIAC: There is a regular rate and rhythm.  VASCULAR: I do not detect carotid bruits. He has palpable pedal pulses. He has some varicose veins in both medial calves.     I did look at his great saphenous vein and small saphenous vein myself with the SonoSite. The great saphenous vein is moderately dilated to 5 mm.  I agree with the study today which shows no reflux at the saphenofemoral junction.  With respect to the small saphenous vein he has reflux throughout and the vein is dilated from 5 to 5.2 mm.  There is a vein of Giacomini which extends well up the thigh and likely With the deep system or great saphenous vein in the proximal to mid thigh.  There is no connection to the popliteal vein.   PULMONARY: There is good air exchange bilaterally without wheezing or rales. ABDOMEN: Soft and non-tender with normal pitched bowel sounds.  MUSCULOSKELETAL: There are no major deformities. NEUROLOGIC: No focal weakness or  paresthesias are detected. SKIN: There are no ulcers or rashes noted. PSYCHIATRIC: The patient has a normal affect.  DATA:    VENOUS DUPLEX: I have independently interpreted his venous duplex scan that was done today.  This was of the right lower extremity only.  There was no evidence of DVT.  There was deep venous reflux involving the common femoral vein and popliteal vein.  There was superficial venous reflux in the great saphenous vein from the proximal thigh to the proximal calf.  Diameters of the vein ranged from 4 to 5 mm.  There was also superficial venous reflux in the small saphenous vein to the mid calf.  There was also a vein of Giacomini.  There was an extension to the popliteal vein.  The diameters of the small saphenous vein ranged from 5 to 5.2 mm.      Deitra Mayo [facility]

## 2021-06-14 ENCOUNTER — Ambulatory Visit: Payer: 59 | Admitting: Family Medicine

## 2021-06-21 NOTE — Progress Notes (Signed)
Name: Mike Perez  MRN/ DOB: [id number], Oct 11, 1960    Age/ Sex: 61 y.o., male    PCP: Susy Frizzle, MD   Reason for Endocrinology Evaluation: Low testosterone      Date of Initial Endocrinology Evaluation: 06/22/2021     HPI: [name] is a 61 y.o. male with a past medical history of Dyslipidemia. The patient presented for initial endocrinology clinic visit on 06/22/2021 for consultative assistance with his low testosterone .   He has been noted with low testosterone in  02/2021 with a nadir of 184.16 ( @8 :13 AM ) with a repeat level of 219 in 03/2021 @ 7:56 AM) - Was not fasting     He has no prior history of testosterone treatment. This was investigated during evaluation for leg pain   He has been less energetic for the past year , he also endorses erectile dysfunction, described as difficulty maintaining erections  Has nocturia x2 Has occasional spontaenous erections Denies OSA  No narctosics Denies illicit drug use No change in shaving frequency  No personal hx of VTE, Mother with DVT  Father with prostate cancer   Denies gynecomastia   Has one  biological son 76 yrs of age     HISTORY:  Past Medical History:  Past Medical History:  Diagnosis Date   Hx of adenomatous polyp of colon 07/08/2014   Hyperlipidemia    Low back pain    Past Surgical History:  Past Surgical History:  Procedure Laterality Date   COLONOSCOPY  09-17-2003   normal dr Wynetta Emery   HERNIA REPAIR      Social History:  reports that he has never smoked. He has never used smokeless tobacco. He reports that he does not drink alcohol and does not use drugs. Family History: family history includes Alcohol abuse in his brother; Colon cancer in his maternal grandmother; Diabetes in his father; Hyperlipidemia in his brother.   HOME MEDICATIONS: Allergies as of 06/22/2021       Reactions   Niaspan [niacin Er]    Severe flushing        Medication List        Accurate as of  June 22, 2021  2:59 PM. If you have any questions, ask your nurse or doctor.          aspirin 81 MG tablet Take 81 mg by mouth daily.   calcium carbonate 600 MG Tabs tablet Commonly known as: Calcium 600 Take 1 tablet (600 mg total) by mouth 2 (two) times daily with a meal.   FISH OIL CONCENTRATE PO Take by mouth.   hydrocortisone 2.5 % cream Apply topically 2 (two) times daily.   multivitamin tablet Take 1 tablet by mouth daily.   rosuvastatin 5 MG tablet Commonly known as: CRESTOR TAKE 1 TABLET BY MOUTH DAILY   Vitamin D3 50 MCG (2000 UT) capsule Take 1 capsule (2,000 Units total) by mouth daily.          REVIEW OF SYSTEMS: A comprehensive ROS was conducted with the patient and is negative except as per HPI     OBJECTIVE:  VS: BP 136/84 (BP Location: Left Arm, Patient Position: Sitting, Cuff Size: Small)    Pulse (!) 102    Ht 6\' 1"  (1.854 m)    Wt 224 lb (101.6 kg)    SpO2 95%    BMI 29.55 kg/m    Wt Readings from Last 3 Encounters:  06/22/21 224 lb (101.6 kg)  06/07/21 220 lb (99.8 kg)  05/30/21 214 lb (97.1 kg)     EXAM: General: Pt appears well and is in NAD No gynecomastia  Eyes: External eye exam normal without stare, lid lag or exophthalmos.  EOM intact.  PERRL.  Neck: General: Supple without adenopathy. Thyroid: Thyroid size normal.  No goiter or nodules appreciated.   Lungs: Clear with good BS bilat with no rales, rhonchi, or wheezes  Heart: Auscultation: RRR.  Abdomen: Normoactive bowel sounds, soft, nontender, without masses or organomegaly palpable  Genitalia :  Left testicular exam normal 46mL, right hydrocele noted   Extremities:  BL LE: No pretibial edema normal ROM and strength.  Mental Status: Judgment, insight: Intact Orientation: Oriented to time, place, and person Mood and affect: No depression, anxiety, or agitation     DATA REVIEWED:     Latest Reference Range & Units 02/23/21 08:13 04/04/21 07:56  Testosterone 300.00  - 890.00 ng/dL 184.16 (L) 219.94 (L)  PTH, Intact 16 - 77 pg/mL 17   TSH 0.35 - 5.50 uIU/mL 1.47 1.88  Triiodothyronine,Free,Serum 2.3 - 4.2 pg/mL  3.4  T4,Free(Direct) 0.60 - 1.60 ng/dL  0.89    ASSESSMENT/PLAN/RECOMMENDATIONS:   Low testosterone:  -Patient with fatigue, erectile dysfunction -We discussed the importance of checking testosterone in the morning and in the fasting status, patient will return for labs, will also check prolactin and LH as well as TFTs -We discussed side effects of testosterone therapy such as erythropoiesis, worsening of sleep apnea that is severe and untreated,  Prostate volumes and serum PSA increase in response to testosterone treatment which might increase BPH and worsen urinary outflow obstruction as well as prostate cancer risk.  - We also discussed there is a possibility of increased cardiovascular risk associated with testosterone use.   Follow-up in 4 months  Signed electronically by: Mack Guise, MD  [facility]  [facility] [address] [place], [place] [address] Phone: [phone] FAX: [phone]   CC: Susy Frizzle, MD [address] [place] [address] Phone: [phone] Fax: [phone]   Return to Endocrinology clinic as below: Future Appointments  Date Time Provider [place]  06/26/2021 11:45 AM [facility] None  07/14/2021  7:45 AM Lyndal Pulley, DO [facility] None  07/18/2021  8:00 AM [facility] None  07/21/2021  8:30 AM Susy Frizzle, MD [facility] None  10/26/2021  7:30 AM Javed Cotto, Melanie Crazier, MD [facility] None

## 2021-06-22 ENCOUNTER — Ambulatory Visit: Payer: 59 | Admitting: Internal Medicine

## 2021-06-22 ENCOUNTER — Encounter: Payer: Self-pay | Admitting: Internal Medicine

## 2021-06-22 ENCOUNTER — Other Ambulatory Visit: Payer: Self-pay

## 2021-06-22 VITALS — BP 136/84 | HR 102 | Ht 73.0 in | Wt 224.0 lb

## 2021-06-22 DIAGNOSIS — R7989 Other specified abnormal findings of blood chemistry: Secondary | ICD-10-CM | POA: Diagnosis not present

## 2021-06-22 NOTE — Patient Instructions (Signed)
Please have blood work done no later than 8 AM and fasting

## 2021-06-26 ENCOUNTER — Other Ambulatory Visit: Payer: 59

## 2021-06-28 ENCOUNTER — Encounter: Payer: Self-pay | Admitting: Internal Medicine

## 2021-07-14 ENCOUNTER — Ambulatory Visit: Payer: 59 | Admitting: Family Medicine

## 2021-07-18 ENCOUNTER — Other Ambulatory Visit: Payer: Self-pay

## 2021-07-18 ENCOUNTER — Other Ambulatory Visit: Payer: 59

## 2021-07-18 DIAGNOSIS — Z125 Encounter for screening for malignant neoplasm of prostate: Secondary | ICD-10-CM | POA: Diagnosis not present

## 2021-07-18 DIAGNOSIS — Z136 Encounter for screening for cardiovascular disorders: Secondary | ICD-10-CM

## 2021-07-18 DIAGNOSIS — Z0000 Encounter for general adult medical examination without abnormal findings: Secondary | ICD-10-CM | POA: Diagnosis not present

## 2021-07-18 DIAGNOSIS — R7303 Prediabetes: Secondary | ICD-10-CM | POA: Diagnosis not present

## 2021-07-19 ENCOUNTER — Ambulatory Visit (INDEPENDENT_AMBULATORY_CARE_PROVIDER_SITE_OTHER): Payer: 59 | Admitting: Family Medicine

## 2021-07-19 ENCOUNTER — Encounter: Payer: Self-pay | Admitting: Family Medicine

## 2021-07-19 VITALS — BP 124/78 | HR 74 | Temp 97.5°F | Resp 18 | Ht 73.0 in | Wt 224.0 lb

## 2021-07-19 DIAGNOSIS — Z0000 Encounter for general adult medical examination without abnormal findings: Secondary | ICD-10-CM

## 2021-07-19 LAB — COMPREHENSIVE METABOLIC PANEL
AG Ratio: 2.2 (calc) (ref 1.0–2.5)
ALT: 14 U/L (ref 9–46)
AST: 21 U/L (ref 10–35)
Albumin: 4.3 g/dL (ref 3.6–5.1)
Alkaline phosphatase (APISO): 50 U/L (ref 35–144)
BUN: 20 mg/dL (ref 7–25)
CO2: 26 mmol/L (ref 20–32)
Calcium: 9.4 mg/dL (ref 8.6–10.3)
Chloride: 107 mmol/L (ref 98–110)
Creat: 0.74 mg/dL (ref 0.70–1.35)
Globulin: 2 g/dL (calc) (ref 1.9–3.7)
Glucose, Bld: 109 mg/dL — ABNORMAL HIGH (ref 65–99)
Potassium: 4.5 mmol/L (ref 3.5–5.3)
Sodium: 141 mmol/L (ref 135–146)
Total Bilirubin: 1.1 mg/dL (ref 0.2–1.2)
Total Protein: 6.3 g/dL (ref 6.1–8.1)

## 2021-07-19 LAB — CBC WITH DIFFERENTIAL/PLATELET
Absolute Monocytes: 497 cells/uL (ref 200–950)
Basophils Absolute: 28 cells/uL (ref 0–200)
Basophils Relative: 0.6 %
Eosinophils Absolute: 281 cells/uL (ref 15–500)
Eosinophils Relative: 6.1 %
HCT: 43.7 % (ref 38.5–50.0)
Hemoglobin: 14.4 g/dL (ref 13.2–17.1)
Lymphs Abs: 1366 cells/uL (ref 850–3900)
MCH: 30.3 pg (ref 27.0–33.0)
MCHC: 33 g/dL (ref 32.0–36.0)
MCV: 91.8 fL (ref 80.0–100.0)
MPV: 10.7 fL (ref 7.5–12.5)
Monocytes Relative: 10.8 %
Neutro Abs: 2429 cells/uL (ref 1500–7800)
Neutrophils Relative %: 52.8 %
Platelets: 199 10*3/uL (ref 140–400)
RBC: 4.76 10*6/uL (ref 4.20–5.80)
RDW: 12.6 % (ref 11.0–15.0)
Total Lymphocyte: 29.7 %
WBC: 4.6 10*3/uL (ref 3.8–10.8)

## 2021-07-19 LAB — LIPID PANEL
Cholesterol: 169 mg/dL (ref <200)
HDL: 43 mg/dL (ref >=40)
LDL Cholesterol (Calc): 109 mg/dL (calc) — ABNORMAL HIGH
Non-HDL Cholesterol (Calc): 126 mg/dL (calc) (ref <130)
Total CHOL/HDL Ratio: 3.9 (calc) (ref <5.0)
Triglycerides: 84 mg/dL (ref <150)

## 2021-07-19 LAB — PSA: PSA: 2.84 ng/mL (ref <=4.00)

## 2021-07-19 NOTE — Progress Notes (Signed)
Subjective:    [name], male    DOB: 1960-06-26, 61 y.o.   MRN: [id number]  HPI  Patient is a very pleasant 61 year old Caucasian gentleman here today for complete physical exam.  His last colonoscopy was in 2016.  They did find 1 polyp.  Originally they recommended a repeat colonoscopy in 2021.  However they pushed it back.  The patient is not sure when they want him to go back for his next colonoscopy.  His PSA was recently checked on his lab work and his PSA has risen slightly from 2.7-2.8 however he denies any lower urinary tract symptoms.  His immunizations shown below are up-to-date.  However the patient reports weakness and stiffness in his legs.  He has end-stage arthritis in his right hip.  He reports weakness in his quadriceps and hamstrings and gluteus muscles.  He has a difficult time pushing himself to a standing position from squatting position.  He is not exercising regularly.  He also has stiffness in his knees.  Is hard for him to bend over and pick things up.  He is on a statin.  He also recently saw Dr. Adrian Saran and had low testosterone.  I reviewed his testosterone levels.  The first was below 200.  The second was still borderline low at 212.  He is not sure that he wants to try a testosterone replacement. Immunization History  Administered Date(s) Administered   Influenza,inj,Quad PF,6+ Mos 04/07/2013, 02/18/2014, 02/15/2015, 02/13/2016, 02/06/2017, 02/11/2018, 02/03/2019, 02/25/2020, 03/21/2021   PFIZER(Purple Top)SARS-COV-2 Vaccination 08/01/2019, 08/24/2019, 03/19/2020   Td 07/27/2003   Tdap 04/07/2013   Zoster Recombinat (Shingrix) 02/11/2018, 04/21/2018, 05/05/2018   Lab on 07/18/2021  Component Date Value Ref Range Status   Cholesterol 07/18/2021 169  <200 mg/dL Final   HDL 07/18/2021 43  > OR = 40 mg/dL Final   Triglycerides 07/18/2021 84  <150 mg/dL Final   LDL Cholesterol (Calc) 07/18/2021 109 (H)  mg/dL (calc) Final   Comment: Reference range:  <100 . Desirable range <100 mg/dL for primary prevention;   <70 mg/dL for patients with CHD or diabetic patients  with > or = 2 CHD risk factors. Marland Kitchen LDL-C is now calculated using the Martin-Hopkins  calculation, which is a validated novel method providing  better accuracy than the Friedewald equation in the  estimation of LDL-C.  Cresenciano Genre et al. Annamaria Helling. 9528;413(24): 2061-2068  (http://education.[employer].com/faq/FAQ164)    Total CHOL/HDL Ratio 07/18/2021 3.9  <5.0 (calc) Final   Non-HDL Cholesterol (Calc) 07/18/2021 126  <130 mg/dL (calc) Final   Comment: For patients with diabetes plus 1 major ASCVD risk  factor, treating to a non-HDL-C goal of <100 mg/dL  (LDL-C of <70 mg/dL) is considered a therapeutic  option.    PSA 07/18/2021 2.84  < OR = 4.00 ng/mL Final   Comment: The total PSA value from this assay system is  standardized against the WHO standard. The test  result will be approximately 20% lower when compared  to the equimolar-standardized total PSA (Beckman  Coulter). Comparison of serial PSA results should be  interpreted with this fact in mind. . This test was performed using the Siemens  chemiluminescent method. Values obtained from  different assay methods cannot be used interchangeably. PSA levels, regardless of value, should not be interpreted as absolute evidence of the presence or absence of disease.    Glucose, Bld 07/18/2021 109 (H)  65 - 99 mg/dL Final   Comment: .  Fasting reference interval . For someone without known diabetes, a glucose value between 100 and 125 mg/dL is consistent with prediabetes and should be confirmed with a follow-up test. .    BUN 07/18/2021 20  7 - 25 mg/dL Final   Creat 07/18/2021 0.74  0.70 - 1.35 mg/dL Final   BUN/Creatinine Ratio 00/93/8182 NOT APPLICABLE  6 - 22 (calc) Final   Sodium 07/18/2021 141  135 - 146 mmol/L Final   Potassium 07/18/2021 4.5  3.5 - 5.3 mmol/L Final   Chloride 07/18/2021 107   98 - 110 mmol/L Final   CO2 07/18/2021 26  20 - 32 mmol/L Final   Calcium 07/18/2021 9.4  8.6 - 10.3 mg/dL Final   Total Protein 07/18/2021 6.3  6.1 - 8.1 g/dL Final   Albumin 07/18/2021 4.3  3.6 - 5.1 g/dL Final   Globulin 07/18/2021 2.0  1.9 - 3.7 g/dL (calc) Final   AG Ratio 07/18/2021 2.2  1.0 - 2.5 (calc) Final   Total Bilirubin 07/18/2021 1.1  0.2 - 1.2 mg/dL Final   Alkaline phosphatase (APISO) 07/18/2021 50  35 - 144 U/L Final   AST 07/18/2021 21  10 - 35 U/L Final   ALT 07/18/2021 14  9 - 46 U/L Final   WBC 07/18/2021 4.6  3.8 - 10.8 Thousand/uL Final   RBC 07/18/2021 4.76  4.20 - 5.80 Million/uL Final   Hemoglobin 07/18/2021 14.4  13.2 - 17.1 g/dL Final   HCT 07/18/2021 43.7  38.5 - 50.0 % Final   MCV 07/18/2021 91.8  80.0 - 100.0 fL Final   MCH 07/18/2021 30.3  27.0 - 33.0 pg Final   MCHC 07/18/2021 33.0  32.0 - 36.0 g/dL Final   RDW 07/18/2021 12.6  11.0 - 15.0 % Final   Platelets 07/18/2021 199  140 - 400 Thousand/uL Final   MPV 07/18/2021 10.7  7.5 - 12.5 fL Final   Neutro Abs 07/18/2021 2,429  1,500 - 7,800 cells/uL Final   Lymphs Abs 07/18/2021 1,366  850 - 3,900 cells/uL Final   Absolute Monocytes 07/18/2021 497  200 - 950 cells/uL Final   Eosinophils Absolute 07/18/2021 281  15 - 500 cells/uL Final   Basophils Absolute 07/18/2021 28  0 - 200 cells/uL Final   Neutrophils Relative % 07/18/2021 52.8  % Final   Total Lymphocyte 07/18/2021 29.7  % Final   Monocytes Relative 07/18/2021 10.8  % Final   Eosinophils Relative 07/18/2021 6.1  % Final   Basophils Relative 07/18/2021 0.6  % Final   Past Medical History:  Diagnosis Date   Hx of adenomatous polyp of colon 07/08/2014   Hyperlipidemia    Low back pain    Past Surgical History:  Procedure Laterality Date   COLONOSCOPY  09-17-2003   normal dr Wynetta Emery   HERNIA REPAIR     Current Outpatient Medications on File Prior to Visit  Medication Sig Dispense Refill   aspirin 81 MG tablet Take 81 mg by mouth daily.      calcium carbonate (CALCIUM 600) 600 MG TABS tablet Take 1 tablet (600 mg total) by mouth 2 (two) times daily with a meal. 30 tablet    Cholecalciferol (VITAMIN D3) 50 MCG (2000 UT) capsule Take 1 capsule (2,000 Units total) by mouth daily.     hydrocortisone 2.5 % cream Apply topically 2 (two) times daily. 30 g 2   Multiple Vitamin (MULTIVITAMIN) tablet Take 1 tablet by mouth daily.     Omega-3 Fatty Acids (FISH OIL CONCENTRATE  PO) Take by mouth.     rosuvastatin (CRESTOR) 5 MG tablet TAKE 1 TABLET BY MOUTH DAILY 90 tablet 3   No current facility-administered medications on file prior to visit.   Allergies  Allergen Reactions   Niaspan [Niacin Er]     Severe flushing   Social History   Socioeconomic History   Marital status: Married    Spouse name: Not on file   Number of children: Not on file   Years of education: Not on file   Highest education level: Not on file  Occupational History   Not on file  Tobacco Use   Smoking status: Never   Smokeless tobacco: Never  Substance and Sexual Activity   Alcohol use: No    Alcohol/week: 0.0 standard drinks   Drug use: No   Sexual activity: Yes    Comment: married.  Works in Therapist, occupational, Psychologist, educational  Other Topics Concern   Not on file  Social History Narrative   Not on file   Social Determinants of Health   Financial Resource Strain: Not on file  Food Insecurity: Not on file  Transportation Needs: Not on file  Physical Activity: Not on file  Stress: Not on file  Social Connections: Not on file  Intimate Partner Violence: Not on file   Family History  Problem Relation Age of Onset   Alcohol abuse Brother    Hyperlipidemia Brother    Diabetes Father    Colon cancer Maternal Grandmother    Esophageal cancer Neg Hx    Rectal cancer Neg Hx    Stomach cancer Neg Hx       Review of Systems  All other systems reviewed and are negative.      Objective:   Physical Exam Vitals reviewed.  Constitutional:       General: He is not in acute distress.    Appearance: He is well-developed. He is not diaphoretic.  HENT:     Head: Normocephalic and atraumatic.     Right Ear: External ear normal.     Left Ear: External ear normal.     Nose: Nose normal.     Mouth/Throat:     Pharynx: No oropharyngeal exudate.  Eyes:     General: No scleral icterus.       Right eye: No discharge.        Left eye: No discharge.     Conjunctiva/sclera: Conjunctivae normal.     Pupils: Pupils are equal, round, and reactive to light.  Neck:     Thyroid: No thyromegaly.     Vascular: No JVD.     Trachea: No tracheal deviation.  Cardiovascular:     Rate and Rhythm: Normal rate and regular rhythm.     Heart sounds: Normal heart sounds. No murmur heard.   No friction rub. No gallop.  Pulmonary:     Effort: Pulmonary effort is normal. No respiratory distress.     Breath sounds: Normal breath sounds. No stridor. No wheezing or rales.  Chest:     Chest wall: No tenderness.  Abdominal:     General: Bowel sounds are normal. There is no distension.     Palpations: Abdomen is soft. There is no mass.     Tenderness: There is no abdominal tenderness. There is no guarding or rebound.  Musculoskeletal:        General: No tenderness. Normal range of motion.     Cervical back: Normal range of motion and neck supple.  Lymphadenopathy:  Cervical: No cervical adenopathy.  Skin:    General: Skin is warm.     Coloration: Skin is not pale.     Findings: No erythema or rash.  Neurological:     Mental Status: He is alert and oriented to person, place, and time.     Cranial Nerves: No cranial nerve deficit.     Motor: No abnormal muscle tone.     Coordination: Coordination normal.     Deep Tendon Reflexes: Reflexes are normal and symmetric.  Psychiatric:        Behavior: Behavior normal.        Thought Content: Thought content normal.        Judgment: Judgment normal.          Assessment & Plan:  General medical  exam I believe the weakness in his legs and stiffness in his legs is multifactorial.  I believe some is related to osteoarthritis, some is related to age, and some is related to lack of use and exercise.  Therefore I recommended starting a stationary bike to help improve the strength in his quadriceps and hamstrings without aggravating his hips or his knees is much.  Also recommended low weight high reps weight lifting such as leg presses and knee extensions and knee flexion exercises.  Again I would recommend a low weight and high reps just to improve flexibility and strength and toning.  We will also temporarily hold his Crestor to see if there is any element of statin induced myalgias and weakness for the next 2 to 3 weeks.  If none of this is helping, we could try testosterone replacement however patient would like to defer that at the present time.  I will contact his gastroenterologist to determine the date of his next colonoscopy.

## 2021-07-21 ENCOUNTER — Encounter: Payer: 59 | Admitting: Family Medicine

## 2021-07-25 ENCOUNTER — Encounter: Payer: Self-pay | Admitting: Family Medicine

## 2021-07-26 ENCOUNTER — Other Ambulatory Visit: Payer: Self-pay | Admitting: Family Medicine

## 2021-07-26 ENCOUNTER — Other Ambulatory Visit (HOSPITAL_COMMUNITY): Payer: Self-pay

## 2021-07-26 DIAGNOSIS — Z1211 Encounter for screening for malignant neoplasm of colon: Secondary | ICD-10-CM

## 2021-07-26 MED ORDER — ESCITALOPRAM OXALATE 10 MG PO TABS
10.0000 mg | ORAL_TABLET | Freq: Every day | ORAL | 5 refills | Status: DC
  Filled 2021-07-26 – 2021-07-27 (×3): qty 30, 30d supply, fill #0
  Filled 2021-08-29: qty 30, 30d supply, fill #1
  Filled 2021-09-25: qty 30, 30d supply, fill #2
  Filled 2021-10-19: qty 30, 30d supply, fill #3
  Filled 2021-11-15: qty 30, 30d supply, fill #4

## 2021-07-27 ENCOUNTER — Other Ambulatory Visit (HOSPITAL_COMMUNITY): Payer: Self-pay

## 2021-08-07 ENCOUNTER — Encounter: Payer: Self-pay | Admitting: Family Medicine

## 2021-08-08 ENCOUNTER — Encounter: Payer: Self-pay | Admitting: Internal Medicine

## 2021-08-15 ENCOUNTER — Encounter: Payer: Self-pay | Admitting: Family Medicine

## 2021-08-18 ENCOUNTER — Encounter: Payer: Self-pay | Admitting: Family Medicine

## 2021-08-18 ENCOUNTER — Other Ambulatory Visit: Payer: Self-pay | Admitting: Family Medicine

## 2021-08-18 ENCOUNTER — Other Ambulatory Visit (HOSPITAL_COMMUNITY): Payer: Self-pay

## 2021-08-18 MED ORDER — DICLOFENAC SODIUM 1 % EX GEL
2.0000 g | Freq: Four times a day (QID) | CUTANEOUS | 3 refills | Status: DC
  Filled 2021-08-18: qty 400, 50d supply, fill #0

## 2021-08-29 ENCOUNTER — Other Ambulatory Visit (HOSPITAL_COMMUNITY): Payer: Self-pay

## 2021-09-08 ENCOUNTER — Other Ambulatory Visit (HOSPITAL_COMMUNITY): Payer: Self-pay

## 2021-09-08 ENCOUNTER — Ambulatory Visit (AMBULATORY_SURGERY_CENTER): Payer: 59 | Admitting: *Deleted

## 2021-09-08 VITALS — Ht 73.0 in | Wt 210.0 lb

## 2021-09-08 DIAGNOSIS — Z86010 Personal history of colonic polyps: Secondary | ICD-10-CM

## 2021-09-08 MED ORDER — NA SULFATE-K SULFATE-MG SULF 17.5-3.13-1.6 GM/177ML PO SOLN
1.0000 | Freq: Once | ORAL | 0 refills | Status: AC
  Filled 2021-09-08: qty 354, 1d supply, fill #0

## 2021-09-08 NOTE — Progress Notes (Signed)

## 2021-09-13 ENCOUNTER — Other Ambulatory Visit (HOSPITAL_COMMUNITY): Payer: Self-pay

## 2021-09-20 ENCOUNTER — Encounter: Payer: Self-pay | Admitting: Internal Medicine

## 2021-09-25 ENCOUNTER — Other Ambulatory Visit (HOSPITAL_COMMUNITY): Payer: Self-pay

## 2021-09-29 ENCOUNTER — Ambulatory Visit (AMBULATORY_SURGERY_CENTER): Payer: 59 | Admitting: Internal Medicine

## 2021-09-29 ENCOUNTER — Encounter: Payer: Self-pay | Admitting: Internal Medicine

## 2021-09-29 VITALS — BP 95/60 | HR 55 | Temp 98.2°F | Resp 11 | Ht 73.0 in | Wt 210.0 lb

## 2021-09-29 DIAGNOSIS — D123 Benign neoplasm of transverse colon: Secondary | ICD-10-CM

## 2021-09-29 DIAGNOSIS — Z86010 Personal history of colonic polyps: Secondary | ICD-10-CM | POA: Diagnosis not present

## 2021-09-29 DIAGNOSIS — Z1211 Encounter for screening for malignant neoplasm of colon: Secondary | ICD-10-CM | POA: Diagnosis not present

## 2021-09-29 MED ORDER — SODIUM CHLORIDE 0.9 % IV SOLN: 500.0000 mL | INTRAVENOUS | Status: DC

## 2021-09-29 NOTE — Progress Notes (Signed)
[facility] History and Physical ? ? ?Primary Care Physician:  Susy Frizzle, MD ? ? ?Reason for Procedure:   Hx colon polyp ? ?Plan:    colonoscopy ? ? ? ? ?HPI: Mike Perez is a 61 y.o. male w/ prior diminutive colon adenoma removed 2016. ? ? ?Past Medical History:  ?Diagnosis Date  ? Arthritis   ? bilateral,hands,hips  ? Hx of adenomatous polyp of colon 07/08/2014  ? Hyperlipidemia   ? Low back pain   ? ? ?Past Surgical History:  ?Procedure Laterality Date  ? COLONOSCOPY  09/17/2003  ? normal dr Wynetta Emery  ? HERNIA REPAIR    ? 1971  ? POLYPECTOMY    ? ? ?Prior to Admission medications   ?Medication Sig Start Date End Date Taking? Authorizing Provider  ?aspirin 81 MG tablet Take 81 mg by mouth daily.   Yes [provider]  ?calcium carbonate (CALCIUM 600) 600 MG TABS tablet Take 1 tablet (600 mg total) by mouth 2 (two) times daily with a meal. 03/06/21  Yes Susy Frizzle, MD  ?Cholecalciferol (VITAMIN D3) 50 MCG (2000 UT) capsule Take 1 capsule (2,000 Units total) by mouth daily. 03/06/21  Yes Susy Frizzle, MD  ?diclofenac Sodium (VOLTAREN) 1 % GEL Apply 2 g topically 4 (four) times daily. 08/18/21  Yes Susy Frizzle, MD  ?escitalopram (LEXAPRO) 10 MG tablet Take 1 tablet (10 mg total) by mouth daily. 07/26/21  Yes Susy Frizzle, MD  ?Multiple Vitamin (MULTIVITAMIN) tablet Take 1 tablet by mouth daily.   Yes [provider]  ?Omega-3 Fatty Acids (FISH OIL CONCENTRATE PO) Take by mouth.   Yes [provider]  ?Red Yeast Rice 600 MG CAPS red yeast rice  1,200 mg twice a day 08/16/21  Yes Pickard, Cammie Mcgee, MD  ? ? ?Current Outpatient Medications  ?Medication Sig Dispense Refill  ? aspirin 81 MG tablet Take 81 mg by mouth daily.    ? calcium carbonate (CALCIUM 600) 600 MG TABS tablet Take 1 tablet (600 mg total) by mouth 2 (two) times daily with a meal. 30 tablet   ? Cholecalciferol (VITAMIN D3) 50 MCG (2000 UT) capsule Take 1 capsule (2,000 Units total) by  mouth daily.    ? diclofenac Sodium (VOLTAREN) 1 % GEL Apply 2 g topically 4 (four) times daily. 350 g 3  ? escitalopram (LEXAPRO) 10 MG tablet Take 1 tablet (10 mg total) by mouth daily. 30 tablet 5  ? Multiple Vitamin (MULTIVITAMIN) tablet Take 1 tablet by mouth daily.    ? Omega-3 Fatty Acids (FISH OIL CONCENTRATE PO) Take by mouth.    ? Red Yeast Rice 600 MG CAPS red yeast rice  1,200 mg twice a day    ? ?Current Facility-Administered Medications  ?Medication Dose Route Frequency Provider Last Rate Last Admin  ? 0.9 %  sodium chloride infusion  500 mL Intravenous Continuous Gatha Mayer, MD      ? ? ?Allergies as of 09/29/2021 - Review Complete 09/29/2021  ?Allergen Reaction Noted  ? Niaspan [niacin er]  04/07/2013  ? ? ?Family History  ?Problem Relation Age of Onset  ? Diabetes Father   ? Colon polyps Brother   ? Alcohol abuse Brother   ? Hyperlipidemia Brother   ? Colon cancer Maternal Grandmother   ? Esophageal cancer Neg Hx   ? Rectal cancer Neg Hx   ? Stomach cancer Neg Hx   ? Crohn's disease Neg Hx   ? ? ?  Social History  ? ?Socioeconomic History  ? Marital status: Married  ?  Spouse name: Not on file  ? Number of children: Not on file  ? Years of education: Not on file  ? Highest education level: Not on file  ?Occupational History  ? Not on file  ?Tobacco Use  ? Smoking status: Never  ?  Passive exposure: Never  ? Smokeless tobacco: Never  ?Vaping Use  ? Vaping Use: Never used  ?Substance and Sexual Activity  ? Alcohol use: No  ?  Alcohol/week: 0.0 standard drinks  ? Drug use: No  ? Sexual activity: Yes  ?  Comment: married.  Works in Therapist, occupational, Psychologist, educational  ?Other Topics Concern  ? Not on file  ?Social History Narrative  ? Not on file  ? ?Social Determinants of Health  ? ?Financial Resource Strain: Not on file  ?Food Insecurity: Not on file  ?Transportation Needs: Not on file  ?Physical Activity: Not on file  ?Stress: Not on file  ?Social Connections: Not on file  ?Intimate Partner Violence: Not on  file  ? ? ?Review of Systems: ? ?All other review of systems negative except as mentioned in the HPI. ? ?Physical Exam: ?Vital signs ?BP 117/69   Pulse 66   Temp 98.2 ?F (36.8 ?C)   Ht '6\' 1"'$  (1.854 m)   Wt 210 lb (95.3 kg)   SpO2 97%   BMI 27.71 kg/m?  ? ?General:   Alert,  Well-developed, well-nourished, pleasant and cooperative in NAD ?Lungs:  Clear throughout to auscultation.   ?Heart:  Regular rate and rhythm; no murmurs, clicks, rubs,  or gallops. ?Abdomen:  Soft, nontender and nondistended. Normal bowel sounds.   ?Neuro/Psych:  Alert and cooperative. Normal mood and affect. A and O x 3 ? ? ?'@Torrez Renfroe'$  Simonne Maffucci, MD, [facility] ?[facility] ?[phone] (pager) ?09/29/2021 9:51 AM@ ? ?

## 2021-09-29 NOTE — Patient Instructions (Addendum)
I found and removed one small polyp that looks benign. ? ?You also have a condition called diverticulosis - common and not usually a problem. Please read the handout provided. ? ?I will let you know pathology results and when to have another routine colonoscopy by mail and/or My Chart. ? ?I appreciate the opportunity to care for you. ?Gatha Mayer, MD, [facility] ? ?YOU HAD AN ENDOSCOPIC PROCEDURE TODAY AT [facility]:   Refer to the procedure report that was given to you for any specific questions about what was found during the examination.  If the procedure report does not answer your questions, please call your gastroenterologist to clarify.  If you requested that your care partner not be given the details of your procedure findings, then the procedure report has been included in a sealed envelope for you to review at your convenience later. ? ?YOU SHOULD EXPECT: Some feelings of bloating in the abdomen. Passage of more gas than usual.  Walking can help get rid of the air that was put into your GI tract during the procedure and reduce the bloating. If you had a lower endoscopy (such as a colonoscopy or flexible sigmoidoscopy) you may notice spotting of blood in your stool or on the toilet paper. If you underwent a bowel prep for your procedure, you may not have a normal bowel movement for a few days. ? ?Please Note:  You might notice some irritation and congestion in your nose or some drainage.  This is from the oxygen used during your procedure.  There is no need for concern and it should clear up in a day or so. ? ?SYMPTOMS TO REPORT IMMEDIATELY: ? ?Following lower endoscopy (colonoscopy or flexible sigmoidoscopy): ? Excessive amounts of blood in the stool ? Significant tenderness or worsening of abdominal pains ? Swelling of the abdomen that is new, acute ? Fever of 100?F or higher ? ? ? ?For urgent or emergent issues, a gastroenterologist can be reached at any hour by calling [phone]. ?Do not use MyChart messaging for urgent concerns.  ? ? ?DIET:  We do recommend a small meal at first, but then you may proceed to your regular diet.  Drink plenty of fluids but you should avoid alcoholic beverages for 24 hours. ? ?ACTIVITY:  You should plan to take it easy for the rest of today and you should NOT DRIVE or use heavy machinery until tomorrow (because of the sedation medicines used during the test).   ? ?FOLLOW UP: ?Our staff will call the number listed on your records 48-72 hours following your procedure to check on you and address any questions or concerns that you may have regarding the information given to you following your procedure. If we do not reach you, we will leave a message.  We will attempt to reach you two times.  During this call, we will ask if you have developed any symptoms of COVID 19. If you develop any symptoms (ie: fever, flu-like symptoms, shortness of breath, cough etc.) before then, please call [phone].  If you test positive for Covid 19 in the 2 weeks post procedure, please call and report this information to [place].   ? ?If any biopsies were taken you will be contacted by phone or by letter within the next 1-3 weeks.  Please call us at [phone] if you have not heard about the biopsies in 3 weeks.  ? ? ?SIGNATURES/CONFIDENTIALITY: ?You and/or your care partner have signed paperwork which  will be entered into your electronic medical record.  These signatures attest to the fact that that the information above on your After Visit Summary has been reviewed and is understood.  Full responsibility of the confidentiality of this discharge information lies with you and/or your care-partner.  ?

## 2021-09-29 NOTE — Progress Notes (Signed)
Sedate, gd SR, tolerated procedure well, VSS, report to RN 

## 2021-09-29 NOTE — Progress Notes (Signed)
Called to room to assist during endoscopic procedure.  Patient ID and intended procedure confirmed with present staff. Received instructions for my participation in the procedure from the performing physician.  

## 2021-09-29 NOTE — Op Note (Signed)
[facility] ?Patient Name: Mike Perez ?Procedure Date: 09/29/2021 9:43 AM ?MRN: [id number] ?Endoscopist: Gatha Mayer , MD ?Age: 61 ?Referring MD:  ?Date of Birth: 08-11-1960 ?Gender: Male ?Account #: [id number] ?Procedure:                Colonoscopy ?Indications:              Surveillance: Personal history of adenomatous  ?                          polyps on last colonoscopy > 5 years ago, Last  ?                          colonoscopy: 2016 ?Medicines:                Monitored Anesthesia Care ?Procedure:                Pre-Anesthesia Assessment: ?                          - Prior to the procedure, a History and Physical  ?                          was performed, and patient medications and  ?                          allergies were reviewed. The patient's tolerance of  ?                          previous anesthesia was also reviewed. The risks  ?                          and benefits of the procedure and the sedation  ?                          options and risks were discussed with the patient.  ?                          All questions were answered, and informed consent  ?                          was obtained. Prior Anticoagulants: The patient has  ?                          taken no previous anticoagulant or antiplatelet  ?                          agents. ASA Grade Assessment: II - A patient with  ?                          mild systemic disease. After reviewing the risks  ?                          and benefits, the patient was deemed in  ?  satisfactory condition to undergo the procedure. ?                          After obtaining informed consent, the colonoscope  ?                          was passed under direct vision. Throughout the  ?                          procedure, the patient's blood pressure, pulse, and  ?                          oxygen saturations were monitored continuously. The  ?                          CF [id number] #[id number] was introduced through the anus   ?                          and advanced to the the cecum, identified by  ?                          appendiceal orifice and ileocecal valve. The  ?                          colonoscopy was performed without difficulty. The  ?                          patient tolerated the procedure well. The quality  ?                          of the bowel preparation was excellent. The bowel  ?                          preparation used was SUPREP via split dose  ?                          instruction. The ileocecal valve, appendiceal  ?                          orifice, and rectum were photographed. ?Scope In: 9:59:14 AM ?Scope Out: 10:14:10 AM ?Scope Withdrawal Time: 0 hours 11 minutes 47 seconds  ?Total Procedure Duration: 0 hours 14 minutes 56 seconds  ?Findings:                 The perianal and digital rectal examinations were  ?                          normal. Pertinent negatives include normal prostate  ?                          (size, shape, and consistency). ?                          A 5 mm polyp was found in the transverse colon. The  ?  polyp was sessile. The polyp was removed with a  ?                          cold snare. Resection and retrieval were complete.  ?                          Verification of patient identification for the  ?                          specimen was done. Estimated blood loss was minimal. ?                          Multiple diverticula were found in the sigmoid  ?                          colon. ?                          The exam was otherwise without abnormality on  ?                          direct and retroflexion views. ?Complications:            No immediate complications. ?Estimated Blood Loss:     Estimated blood loss was minimal. ?Impression:               - One 5 mm polyp in the transverse colon, removed  ?                          with a cold snare. Resected and retrieved. ?                          - Diverticulosis in the sigmoid colon. ?                           - The examination was otherwise normal on direct  ?                          and retroflexion views. ?                          - Personal history of colonic polyps. diminutive  ?                          adenoma 2016 ?Recommendation:           - Patient has a contact number available for  ?                          emergencies. The signs and symptoms of potential  ?                          delayed complications were discussed with the  ?                          patient. Return to normal activities tomorrow.  ?  Written discharge instructions were provided to the  ?                          patient. ?                          - Resume previous diet. ?                          - Continue present medications. ?                          - Await pathology results. ?                          - Repeat colonoscopy is recommended for  ?                          surveillance. The colonoscopy date will be  ?                          determined after pathology results from today's  ?                          exam become available for review. ?Gatha Mayer, MD ?09/29/2021 10:19:16 AM ?This report has been signed electronically. ?

## 2021-10-03 ENCOUNTER — Telehealth: Payer: Self-pay

## 2021-10-03 ENCOUNTER — Telehealth: Payer: Self-pay | Admitting: *Deleted

## 2021-10-03 NOTE — Telephone Encounter (Signed)
Attempted f/u call. No answer, left VM. 

## 2021-10-03 NOTE — Telephone Encounter (Signed)
?  Follow up Call- ? ? ?  09/29/2021  ?  9:08 AM  ?Call back number  ?Post procedure Call Back phone  # [phone]  ?Permission to leave phone message Yes  ?  ? ?Patient questions: ? ?Do you have a fever, pain , or abdominal swelling? No. ?Pain Score  0 * ? ?Have you tolerated food without any problems? Yes.   ? ?Have you been able to return to your normal activities? Yes.   ? ?Do you have any questions about your discharge instructions: ?Diet   No. ?Medications  No. ?Follow up visit  No. ? ?Do you have questions or concerns about your Care? No. ? ?Actions: ?* If pain score is 4 or above: ?No action needed, pain <4. ? ? ?

## 2021-10-08 ENCOUNTER — Encounter: Payer: Self-pay | Admitting: Internal Medicine

## 2021-10-09 ENCOUNTER — Encounter: Payer: Self-pay | Admitting: Family Medicine

## 2021-10-10 ENCOUNTER — Other Ambulatory Visit: Payer: Self-pay | Admitting: Family Medicine

## 2021-10-10 DIAGNOSIS — H6120 Impacted cerumen, unspecified ear: Secondary | ICD-10-CM

## 2021-10-19 ENCOUNTER — Other Ambulatory Visit (HOSPITAL_COMMUNITY): Payer: Self-pay

## 2021-10-26 ENCOUNTER — Ambulatory Visit: Payer: 59 | Admitting: Internal Medicine

## 2021-11-15 ENCOUNTER — Other Ambulatory Visit (HOSPITAL_COMMUNITY): Payer: Self-pay

## 2021-11-22 ENCOUNTER — Encounter: Payer: Self-pay | Admitting: Family Medicine

## 2021-11-23 ENCOUNTER — Encounter: Payer: Self-pay | Admitting: Family Medicine

## 2021-11-27 ENCOUNTER — Other Ambulatory Visit: Payer: Self-pay | Admitting: Family Medicine

## 2021-12-07 DIAGNOSIS — H93293 Other abnormal auditory perceptions, bilateral: Secondary | ICD-10-CM | POA: Diagnosis not present

## 2021-12-07 DIAGNOSIS — H6123 Impacted cerumen, bilateral: Secondary | ICD-10-CM | POA: Diagnosis not present

## 2021-12-07 DIAGNOSIS — H900 Conductive hearing loss, bilateral: Secondary | ICD-10-CM | POA: Insufficient documentation

## 2021-12-08 ENCOUNTER — Encounter: Payer: Self-pay | Admitting: Family Medicine

## 2022-01-10 ENCOUNTER — Encounter: Payer: Self-pay | Admitting: Family Medicine

## 2022-01-11 ENCOUNTER — Telehealth: Payer: Self-pay

## 2022-01-11 NOTE — Telephone Encounter (Signed)
Error/ Duplicate

## 2022-01-11 NOTE — Telephone Encounter (Signed)
Spoke w/pt and agreed to OV w/Dr. Dennard Schaumann after lab to discuss the coronary calcium score test with him when he comes in.

## 2022-01-26 ENCOUNTER — Other Ambulatory Visit (INDEPENDENT_AMBULATORY_CARE_PROVIDER_SITE_OTHER): Payer: 59

## 2022-01-26 ENCOUNTER — Ambulatory Visit: Payer: 59 | Admitting: Family Medicine

## 2022-01-26 VITALS — BP 118/62 | HR 72 | Temp 98.6°F | Ht 73.0 in | Wt 218.0 lb

## 2022-01-26 DIAGNOSIS — R7303 Prediabetes: Secondary | ICD-10-CM | POA: Diagnosis not present

## 2022-01-26 DIAGNOSIS — Z0000 Encounter for general adult medical examination without abnormal findings: Secondary | ICD-10-CM

## 2022-01-26 DIAGNOSIS — M79641 Pain in right hand: Secondary | ICD-10-CM

## 2022-01-26 DIAGNOSIS — R7989 Other specified abnormal findings of blood chemistry: Secondary | ICD-10-CM | POA: Diagnosis not present

## 2022-01-26 DIAGNOSIS — E7800 Pure hypercholesterolemia, unspecified: Secondary | ICD-10-CM

## 2022-01-26 DIAGNOSIS — M79642 Pain in left hand: Secondary | ICD-10-CM | POA: Diagnosis not present

## 2022-01-26 DIAGNOSIS — R972 Elevated prostate specific antigen [PSA]: Secondary | ICD-10-CM

## 2022-01-26 DIAGNOSIS — Z23 Encounter for immunization: Secondary | ICD-10-CM | POA: Diagnosis not present

## 2022-01-26 NOTE — Progress Notes (Signed)
Subjective:    [name], male    DOB: 02/27/1961, 61 y.o.   MRN: [id number]  Patient has a few concerns today.  First, he had to stop his statins last summer due to diffuse myalgias in his legs.  After stopping the statin, the pain in his legs went away.  He is not currently taking any statin but he is taking fish oil and red yeast rice.  He had his labs drawn this morning to check his cholesterol.  Second he is also due for his flu shot.  Third he reports bilateral joint pain in both hands.  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  I suspect that he has osteoarthritis.  He is not currently taking any NSAID.  He states that his hands will ache and throb at times.  He denies any numbness or tingling.  Last he would like to be scheduled for coronary artery calcium score.  He denies any chest pain or shortness of breath.  He is willing to pay cash price for this Past Medical History:  Diagnosis Date   Arthritis    bilateral,hands,hips   Hx of adenomatous polyp of colon 07/08/2014   Hyperlipidemia    Low back pain    Past Surgical History:  Procedure Laterality Date   COLONOSCOPY  09/17/2003   normal dr Wynetta Emery   HERNIA REPAIR     1971   POLYPECTOMY     Current Outpatient Medications on File Prior to Visit  Medication Sig Dispense Refill   aspirin 81 MG tablet Take 81 mg by mouth daily.     calcium carbonate (CALCIUM 600) 600 MG TABS tablet Take 1 tablet (600 mg total) by mouth 2 (two) times daily with a meal. 30 tablet    Cholecalciferol (VITAMIN D3) 50 MCG (2000 UT) capsule Take 1 capsule (2,000 Units total) by mouth daily.     Multiple Vitamin (MULTIVITAMIN) tablet Take 1 tablet by mouth daily.     Omega-3 Fatty Acids (FISH OIL CONCENTRATE PO) Take by mouth.     Red Yeast Rice 600 MG CAPS red yeast rice  1,200 mg twice a day     Current Facility-Administered Medications on File Prior to Visit  Medication Dose Route Frequency  Provider Last Rate Last Admin   0.9 %  sodium chloride infusion  500 mL Intravenous Continuous Gatha Mayer, MD       Allergies  Allergen Reactions   Niaspan Durene Cal Er]     Severe flushing   Social History   Socioeconomic History   Marital status: Married    Spouse name: Not on file   Number of children: Not on file   Years of education: Not on file   Highest education level: Not on file  Occupational History   Not on file  Tobacco Use   Smoking status: Never    Passive exposure: Never   Smokeless tobacco: Never  Vaping Use   Vaping Use: Never used  Substance and Sexual Activity   Alcohol use: No    Alcohol/week: 0.0 standard drinks of alcohol   Drug use: No   Sexual activity: Yes    Comment: married.  Works in Therapist, occupational, Psychologist, educational  Other Topics Concern   Not on file  Social History Narrative   Not on file   Social Determinants of Health   Financial Resource Strain: Not on file  Food Insecurity: Not on file  Transportation Needs: Not on  file  Physical Activity: Not on file  Stress: Not on file  Social Connections: Not on file  Intimate Partner Violence: Not on file      Review of Systems  Psychiatric/Behavioral:  Positive for depression.   All other systems reviewed and are negative.      Objective:   Physical Exam Vitals reviewed.  HENT:     Head: Normocephalic and atraumatic.     Jaw: There is normal jaw occlusion.     Salivary Glands: Right salivary gland is not diffusely enlarged or tender. Left salivary gland is not diffusely enlarged or tender.     Right Ear: Hearing, tympanic membrane and ear canal normal.     Left Ear: Hearing, tympanic membrane and ear canal normal.     Nose:     Right Turbinates: Not swollen.     Left Turbinates: Not swollen.     Right Sinus: No maxillary sinus tenderness or frontal sinus tenderness.     Left Sinus: No maxillary sinus tenderness or frontal sinus tenderness.     Mouth/Throat:     Mouth: No oral  lesions.     Pharynx: Pharyngeal swelling and posterior oropharyngeal erythema present. No oropharyngeal exudate.     Tonsils: No tonsillar exudate or tonsillar abscesses.  Cardiovascular:     Rate and Rhythm: Normal rate and regular rhythm.     Heart sounds: Normal heart sounds.  Pulmonary:     Effort: Pulmonary effort is normal.     Breath sounds: Normal breath sounds.  Abdominal:     General: Bowel sounds are normal. There is no distension.     Palpations: Abdomen is soft.     Tenderness: There is no abdominal tenderness. There is no guarding or rebound.     Hernia: There is no hernia in the left inguinal area.  Musculoskeletal:     Cervical back: Neck supple.           Assessment & Plan:  Encounter for immunization - Plan: Flu Vaccine QUAD 6+ mos PF IM (Fluarix Quad PF)  Flu vaccine need - Plan: Flu Vaccine QUAD 6+ mos PF IM (Fluarix Quad PF)  Pure hypercholesterolemia - Plan: CT CARDIAC SCORING (SELF PAY ONLY)  Pain in both hands - Plan: Sedimentation rate, Rheumatoid factor I suspect that his bilateral hand pain is due to osteoarthritis.  Check a rheumatoid factor but if negative I would recommend Mobic 15 mg daily.  I will check a fasting lipid panel.  Ideally I like his LDL cholesterol to be below 100.  If significantly elevated above 100, and his coronary calcium score is elevated, I would recommend trying another statin or we can try switching to Zetia.  Patient received his flu shot today.

## 2022-01-27 LAB — CBC WITH DIFFERENTIAL/PLATELET
Absolute Monocytes: 433 cells/uL (ref 200–950)
Basophils Absolute: 42 cells/uL (ref 0–200)
Basophils Relative: 1 %
Eosinophils Absolute: 273 cells/uL (ref 15–500)
Eosinophils Relative: 6.5 %
HCT: 43 % (ref 38.5–50.0)
Hemoglobin: 14.7 g/dL (ref 13.2–17.1)
Lymphs Abs: 1445 cells/uL (ref 850–3900)
MCH: 30.8 pg (ref 27.0–33.0)
MCHC: 34.2 g/dL (ref 32.0–36.0)
MCV: 90.1 fL (ref 80.0–100.0)
MPV: 10.5 fL (ref 7.5–12.5)
Monocytes Relative: 10.3 %
Neutro Abs: 2008 cells/uL (ref 1500–7800)
Neutrophils Relative %: 47.8 %
Platelets: 187 10*3/uL (ref 140–400)
RBC: 4.77 10*6/uL (ref 4.20–5.80)
RDW: 12.5 % (ref 11.0–15.0)
Total Lymphocyte: 34.4 %
WBC: 4.2 10*3/uL (ref 3.8–10.8)

## 2022-01-27 LAB — BASIC METABOLIC PANEL
BUN/Creatinine Ratio: 37 (calc) — ABNORMAL HIGH (ref 6–22)
BUN: 28 mg/dL — ABNORMAL HIGH (ref 7–25)
CO2: 24 mmol/L (ref 20–32)
Calcium: 9.2 mg/dL (ref 8.6–10.3)
Chloride: 107 mmol/L (ref 98–110)
Creat: 0.76 mg/dL (ref 0.70–1.35)
Glucose, Bld: 98 mg/dL (ref 65–99)
Potassium: 4.7 mmol/L (ref 3.5–5.3)
Sodium: 140 mmol/L (ref 135–146)

## 2022-01-27 LAB — LIPID PANEL
Cholesterol: 234 mg/dL — ABNORMAL HIGH (ref <200)
HDL: 46 mg/dL (ref >=40)
LDL Cholesterol (Calc): 169 mg/dL (calc) — ABNORMAL HIGH
Non-HDL Cholesterol (Calc): 188 mg/dL (calc) — ABNORMAL HIGH (ref <130)
Total CHOL/HDL Ratio: 5.1 (calc) — ABNORMAL HIGH (ref <5.0)
Triglycerides: 85 mg/dL (ref <150)

## 2022-01-27 LAB — RHEUMATOID FACTOR: Rheumatoid fact SerPl-aCnc: <14 IU/mL (ref <14)

## 2022-01-29 ENCOUNTER — Other Ambulatory Visit (HOSPITAL_COMMUNITY): Payer: Self-pay

## 2022-01-29 ENCOUNTER — Other Ambulatory Visit: Payer: Self-pay

## 2022-01-29 ENCOUNTER — Telehealth: Payer: Self-pay

## 2022-01-29 DIAGNOSIS — E789 Disorder of lipoprotein metabolism, unspecified: Secondary | ICD-10-CM

## 2022-01-29 MED ORDER — EZETIMIBE 10 MG PO TABS
10.0000 mg | ORAL_TABLET | Freq: Every day | ORAL | 3 refills | Status: DC
  Filled 2022-01-29 (×2): qty 90, 90d supply, fill #0
  Filled 2022-04-20 (×2): qty 90, 90d supply, fill #1
  Filled 2022-07-12: qty 90, 90d supply, fill #2
  Filled 2022-10-22 (×2): qty 90, 90d supply, fill #3
  Filled 2022-10-25: qty 90, 90d supply, fill #0

## 2022-01-29 NOTE — Telephone Encounter (Signed)
Please call pt to make an appt for 60mo lab

## 2022-01-30 ENCOUNTER — Other Ambulatory Visit (HOSPITAL_COMMUNITY): Payer: Self-pay

## 2022-02-02 ENCOUNTER — Ambulatory Visit (INDEPENDENT_AMBULATORY_CARE_PROVIDER_SITE_OTHER): Payer: 59 | Admitting: Podiatry

## 2022-02-02 ENCOUNTER — Ambulatory Visit (INDEPENDENT_AMBULATORY_CARE_PROVIDER_SITE_OTHER): Payer: 59

## 2022-02-02 DIAGNOSIS — M79671 Pain in right foot: Secondary | ICD-10-CM

## 2022-02-02 DIAGNOSIS — M21612 Bunion of left foot: Secondary | ICD-10-CM

## 2022-02-02 DIAGNOSIS — M21611 Bunion of right foot: Secondary | ICD-10-CM

## 2022-02-02 DIAGNOSIS — M2021 Hallux rigidus, right foot: Secondary | ICD-10-CM | POA: Diagnosis not present

## 2022-02-02 NOTE — Progress Notes (Unsigned)
Subjective:   [name], male   DOB: 61 y.o.   MRN: [id number]   HPI Chief Complaint  Patient presents with   Bunions    Bilateral bunions, right is worse than the left, Denies any pain, Started 10+ years ago, hallux is starting to turn inward,     Right is worse than the left. No recent treatment. He gets a blister at times on the 2nd toe where the big toe rubs the 2nd toe. Shoes bother him, he has had to change shoes .   ROS      Objective:  Physical Exam  ***     Assessment:  ***     Plan:  ***

## 2022-02-05 ENCOUNTER — Encounter: Payer: Self-pay | Admitting: Family Medicine

## 2022-02-08 ENCOUNTER — Other Ambulatory Visit: Payer: Self-pay | Admitting: Podiatry

## 2022-02-08 DIAGNOSIS — M2021 Hallux rigidus, right foot: Secondary | ICD-10-CM

## 2022-02-13 ENCOUNTER — Ambulatory Visit (HOSPITAL_BASED_OUTPATIENT_CLINIC_OR_DEPARTMENT_OTHER)
Admission: RE | Admit: 2022-02-13 | Discharge: 2022-02-13 | Disposition: A | Discharge status: Home / Self Care | Payer: 59 | Source: Ambulatory Visit | Attending: Family Medicine | Admitting: Family Medicine

## 2022-02-13 ENCOUNTER — Encounter (HOSPITAL_BASED_OUTPATIENT_CLINIC_OR_DEPARTMENT_OTHER): Payer: Self-pay

## 2022-02-13 DIAGNOSIS — E7800 Pure hypercholesterolemia, unspecified: Secondary | ICD-10-CM | POA: Insufficient documentation

## 2022-02-19 ENCOUNTER — Encounter: Payer: Self-pay | Admitting: Family Medicine

## 2022-02-20 ENCOUNTER — Other Ambulatory Visit: Payer: Self-pay

## 2022-02-20 DIAGNOSIS — R7303 Prediabetes: Secondary | ICD-10-CM

## 2022-02-20 DIAGNOSIS — E7800 Pure hypercholesterolemia, unspecified: Secondary | ICD-10-CM

## 2022-02-21 ENCOUNTER — Other Ambulatory Visit: Payer: Self-pay

## 2022-02-21 DIAGNOSIS — E7800 Pure hypercholesterolemia, unspecified: Secondary | ICD-10-CM

## 2022-02-21 DIAGNOSIS — R0789 Other chest pain: Secondary | ICD-10-CM

## 2022-02-25 ENCOUNTER — Ambulatory Visit (HOSPITAL_BASED_OUTPATIENT_CLINIC_OR_DEPARTMENT_OTHER)
Admission: RE | Admit: 2022-02-25 | Discharge: 2022-02-25 | Disposition: A | Discharge status: Home / Self Care | Payer: 59 | Source: Ambulatory Visit | Attending: Family Medicine | Admitting: Family Medicine

## 2022-02-25 DIAGNOSIS — R0789 Other chest pain: Secondary | ICD-10-CM | POA: Diagnosis not present

## 2022-02-25 DIAGNOSIS — E7800 Pure hypercholesterolemia, unspecified: Secondary | ICD-10-CM | POA: Diagnosis not present

## 2022-02-25 MED ORDER — IOHEXOL 350 MG/ML SOLN
100.0000 mL | Freq: Once | INTRAVENOUS | Status: AC | PRN
  Administered 2022-02-25: 100 mL via INTRAVENOUS

## 2022-02-26 ENCOUNTER — Encounter: Payer: Self-pay | Admitting: Family Medicine

## 2022-02-26 DIAGNOSIS — I7121 Aneurysm of the ascending aorta, without rupture: Secondary | ICD-10-CM | POA: Insufficient documentation

## 2022-03-02 DIAGNOSIS — H5203 Hypermetropia, bilateral: Secondary | ICD-10-CM | POA: Diagnosis not present

## 2022-04-20 ENCOUNTER — Other Ambulatory Visit (HOSPITAL_COMMUNITY): Payer: Self-pay

## 2022-04-20 ENCOUNTER — Encounter (HOSPITAL_COMMUNITY): Payer: Self-pay

## 2022-05-07 ENCOUNTER — Other Ambulatory Visit: Payer: 59

## 2022-05-07 DIAGNOSIS — E7800 Pure hypercholesterolemia, unspecified: Secondary | ICD-10-CM

## 2022-05-07 DIAGNOSIS — R7303 Prediabetes: Secondary | ICD-10-CM

## 2022-05-08 LAB — COMPREHENSIVE METABOLIC PANEL
AG Ratio: 2 (calc) (ref 1.0–2.5)
ALT: 16 U/L (ref 9–46)
AST: 16 U/L (ref 10–35)
Albumin: 4.3 g/dL (ref 3.6–5.1)
Alkaline phosphatase (APISO): 51 U/L (ref 35–144)
BUN: 20 mg/dL (ref 7–25)
CO2: 28 mmol/L (ref 20–32)
Calcium: 9.7 mg/dL (ref 8.6–10.3)
Chloride: 107 mmol/L (ref 98–110)
Creat: 0.7 mg/dL (ref 0.70–1.35)
Globulin: 2.1 g/dL (calc) (ref 1.9–3.7)
Glucose, Bld: 108 mg/dL — ABNORMAL HIGH (ref 65–99)
Potassium: 5 mmol/L (ref 3.5–5.3)
Sodium: 145 mmol/L (ref 135–146)
Total Bilirubin: 0.8 mg/dL (ref 0.2–1.2)
Total Protein: 6.4 g/dL (ref 6.1–8.1)

## 2022-05-08 LAB — LIPID PANEL
Cholesterol: 182 mg/dL (ref <200)
HDL: 44 mg/dL (ref >=40)
LDL Cholesterol (Calc): 115 mg/dL (calc) — ABNORMAL HIGH
Non-HDL Cholesterol (Calc): 138 mg/dL (calc) — ABNORMAL HIGH (ref <130)
Total CHOL/HDL Ratio: 4.1 (calc) (ref <5.0)
Triglycerides: 122 mg/dL (ref <150)

## 2022-07-04 ENCOUNTER — Encounter: Payer: Self-pay | Admitting: Family Medicine

## 2022-07-12 ENCOUNTER — Encounter: Payer: Self-pay | Admitting: Family Medicine

## 2022-07-13 ENCOUNTER — Other Ambulatory Visit (HOSPITAL_COMMUNITY): Payer: Self-pay

## 2022-07-13 ENCOUNTER — Other Ambulatory Visit: Payer: Self-pay

## 2022-07-20 ENCOUNTER — Other Ambulatory Visit: Payer: 59

## 2022-07-20 DIAGNOSIS — E349 Endocrine disorder, unspecified: Secondary | ICD-10-CM

## 2022-07-20 DIAGNOSIS — E7800 Pure hypercholesterolemia, unspecified: Secondary | ICD-10-CM | POA: Diagnosis not present

## 2022-07-20 DIAGNOSIS — R7989 Other specified abnormal findings of blood chemistry: Secondary | ICD-10-CM

## 2022-07-20 DIAGNOSIS — Z125 Encounter for screening for malignant neoplasm of prostate: Secondary | ICD-10-CM | POA: Diagnosis not present

## 2022-07-20 DIAGNOSIS — R7303 Prediabetes: Secondary | ICD-10-CM | POA: Diagnosis not present

## 2022-07-23 ENCOUNTER — Other Ambulatory Visit (HOSPITAL_COMMUNITY): Payer: Self-pay

## 2022-07-23 ENCOUNTER — Ambulatory Visit (INDEPENDENT_AMBULATORY_CARE_PROVIDER_SITE_OTHER): Payer: 59 | Admitting: Family Medicine

## 2022-07-23 ENCOUNTER — Encounter: Payer: Self-pay | Admitting: Family Medicine

## 2022-07-23 VITALS — BP 124/72 | HR 99 | Temp 98.9°F | Ht 73.0 in | Wt 232.0 lb

## 2022-07-23 DIAGNOSIS — Z0000 Encounter for general adult medical examination without abnormal findings: Secondary | ICD-10-CM

## 2022-07-23 DIAGNOSIS — E7800 Pure hypercholesterolemia, unspecified: Secondary | ICD-10-CM

## 2022-07-23 DIAGNOSIS — Z0001 Encounter for general adult medical examination with abnormal findings: Secondary | ICD-10-CM | POA: Diagnosis not present

## 2022-07-23 MED ORDER — DICLOFENAC SODIUM 75 MG PO TBEC
75.0000 mg | DELAYED_RELEASE_TABLET | Freq: Two times a day (BID) | ORAL | 3 refills | Status: DC
  Filled 2022-07-23 (×3): qty 60, 30d supply, fill #0
  Filled 2022-08-31: qty 180, 90d supply, fill #1

## 2022-07-23 NOTE — Progress Notes (Signed)
Subjective:    [name], male    DOB: 1961-03-28, 62 y.o.   MRN: [id number]  HPI  Patient is a very pleasant 62 year old Caucasian gentleman here today for complete physical exam.  Patient had a colonoscopy in May of last year.  It showed 1 diminutive polyp.  Recommended repeat colonoscopy in 7 years.  Due for PSA today for prostate cancer.  Is due for tetanus shot.  He is also due for COVID booster.  He reports urinary frequency and urgency.  He reports a heavy strong stream.  He states that he will suddenly have the urge to urinate.  He is getting to the point that is compromising his quality of life.  He is having to plan trips in order to go to the bathroom.  He denies any hesitancy or weak stream.  He denies any dysuria.  He also would like medication to assist in weight loss.  He has a history of a mildly elevated blood sugar of 105.  He has been trying weight watchers but has had no success losing weight through weight watchers.  He is exercising daily with his job.  He also reports bilateral pain in his MCP PIP and DIP joints.  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  He has been doing that for 40 years.  Therefore I feel that he is likely developing osteoarthritis Immunization History  Administered Date(s) Administered   Influenza,inj,Quad PF,6+ Mos 04/07/2013, 02/18/2014, 02/15/2015, 02/13/2016, 02/06/2017, 02/11/2018, 02/03/2019, 02/25/2020, 03/21/2021, 01/26/2022   PFIZER(Purple Top)SARS-COV-2 Vaccination 08/01/2019, 08/24/2019, 03/19/2020   Td 07/27/2003   Tdap 04/07/2013   Zoster Recombinat (Shingrix) 02/11/2018, 04/21/2018, 05/05/2018   Lab on 07/20/2022  Component Date Value Ref Range Status   WBC 07/20/2022 4.9  3.8 - 10.8 Thousand/uL Final   RBC 07/20/2022 4.84  4.20 - 5.80 Million/uL Final   Hemoglobin 07/20/2022 14.4  13.2 - 17.1 g/dL Final   HCT 07/20/2022 43.4  38.5 - 50.0 % Final   MCV 07/20/2022 89.7   80.0 - 100.0 fL Final   MCH 07/20/2022 29.8  27.0 - 33.0 pg Final   MCHC 07/20/2022 33.2  32.0 - 36.0 g/dL Final   RDW 07/20/2022 12.8  11.0 - 15.0 % Final   Platelets 07/20/2022 211  140 - 400 Thousand/uL Final   MPV 07/20/2022 10.3  7.5 - 12.5 fL Final   Neutro Abs 07/20/2022 2,715  1,500 - 7,800 cells/uL Final   Lymphs Abs 07/20/2022 1,416  850 - 3,900 cells/uL Final   Absolute Monocytes 07/20/2022 500  200 - 950 cells/uL Final   Eosinophils Absolute 07/20/2022 230  15 - 500 cells/uL Final   Basophils Absolute 07/20/2022 39  0 - 200 cells/uL Final   Neutrophils Relative % 07/20/2022 55.4  % Final   Total Lymphocyte 07/20/2022 28.9  % Final   Monocytes Relative 07/20/2022 10.2  % Final   Eosinophils Relative 07/20/2022 4.7  % Final   Basophils Relative 07/20/2022 0.8  % Final   Glucose, Bld 07/20/2022 105 (H)  65 - 99 mg/dL Final   Comment: .            Fasting reference interval . For someone without known diabetes, a glucose value between 100 and 125 mg/dL is consistent with prediabetes and should be confirmed with a follow-up test. .    BUN 07/20/2022 25  7 - 25 mg/dL Final   Creat 07/20/2022 0.72  0.70 - 1.35  mg/dL Final   eGFR 07/20/2022 104  > OR = 60 mL/min/1.88m Final   BUN/Creatinine Ratio 07/20/2022 SEE NOTE:  6 - 22 (calc) Final   Comment:    Not Reported: BUN and Creatinine are within    reference range. .    Sodium 07/20/2022 141  135 - 146 mmol/L Final   Potassium 07/20/2022 4.8  3.5 - 5.3 mmol/L Final   Chloride 07/20/2022 107  98 - 110 mmol/L Final   CO2 07/20/2022 27  20 - 32 mmol/L Final   Calcium 07/20/2022 9.2  8.6 - 10.3 mg/dL Final   Total Protein 07/20/2022 6.4  6.1 - 8.1 g/dL Final   Albumin 07/20/2022 4.4  3.6 - 5.1 g/dL Final   Globulin 07/20/2022 2.0  1.9 - 3.7 g/dL (calc) Final   AG Ratio 07/20/2022 2.2  1.0 - 2.5 (calc) Final   Total Bilirubin 07/20/2022 1.0  0.2 - 1.2 mg/dL Final   Alkaline phosphatase (APISO) 07/20/2022 52  35 - 144 U/L  Final   AST 07/20/2022 20  10 - 35 U/L Final   ALT 07/20/2022 18  9 - 46 U/L Final   Cholesterol 07/20/2022 188  <200 mg/dL Final   HDL 07/20/2022 46  > OR = 40 mg/dL Final   Triglycerides 07/20/2022 60  <150 mg/dL Final   LDL Cholesterol (Calc) 07/20/2022 127 (H)  mg/dL (calc) Final   Comment: Reference range: <100 . Desirable range <100 mg/dL for primary prevention;   <70 mg/dL for patients with CHD or diabetic patients  with > or = 2 CHD risk factors. .Marland KitchenLDL-C is now calculated using the Martin-Hopkins  calculation, which is a validated novel method providing  better accuracy than the Friedewald equation in the  estimation of LDL-C.  MCresenciano Genreet al. JAnnamaria Helling [id number]:[id number]: 2061-2068  (http://education.[facility].com/faq/FAQ164)    Total CHOL/HDL Ratio 07/20/2022 4.1  <5.0 (calc) Final   Non-HDL Cholesterol (Calc) 07/20/2022 142 (H)  <130 mg/dL (calc) Final   Comment: For patients with diabetes plus 1 major ASCVD risk  factor, treating to a non-HDL-C goal of <100 mg/dL  (LDL-C of <70 mg/dL) is considered a therapeutic  option.    Testosterone 07/20/2022 301  250 - 827 ng/dL Final   Past Medical History:  Diagnosis Date   Arthritis    bilateral,hands,hips   [place]    Hx of adenomatous polyp of colon 07/08/2014   Hyperlipidemia    Low back pain    Past Surgical History:  Procedure Laterality Date   COLONOSCOPY  09/17/2003   normal dr JWynetta Emery  HERNIA REPAIR     1971   POLYPECTOMY     Current Outpatient Medications on File Prior to Visit  Medication Sig Dispense Refill   aspirin 81 MG tablet Take 81 mg by mouth daily.     calcium carbonate (CALCIUM 600) 600 MG TABS tablet Take 1 tablet (600 mg total) by mouth 2 (two) times daily with a meal. 30 tablet    Cholecalciferol (VITAMIN D3) 50 MCG (2000 UT) capsule Take 1 capsule (2,000 Units total) by mouth daily.     ezetimibe (ZETIA) 10 MG tablet Take 1 tablet (10 mg total) by mouth daily. 90  tablet 3   Multiple Vitamin (MULTIVITAMIN) tablet Take 1 tablet by mouth daily.     Omega-3 Fatty Acids (FISH OIL CONCENTRATE PO) Take by mouth.     No current facility-administered medications on file prior to visit.   Allergies  Allergen Reactions  Niaspan [Niacin Er]     Severe flushing   Social History   Socioeconomic History   Marital status: Married    Spouse name: Not on file   Number of children: Not on file   Years of education: Not on file   Highest education level: Not on file  Occupational History   Not on file  Tobacco Use   Smoking status: Never    Passive exposure: Never   Smokeless tobacco: Never  Vaping Use   Vaping Use: Never used  Substance and Sexual Activity   Alcohol use: No    Alcohol/week: 0.0 standard drinks of alcohol   Drug use: No   Sexual activity: Yes    Comment: married.  Works in Therapist, occupational, Psychologist, educational  Other Topics Concern   Not on file  Social History Narrative   Not on file   Social Determinants of Health   Financial Resource Strain: Not on file  Food Insecurity: Not on file  Transportation Needs: Not on file  Physical Activity: Not on file  Stress: Not on file  Social Connections: Not on file  Intimate Partner Violence: Not on file   Family History  Problem Relation Age of Onset   Diabetes Father    Colon polyps Brother    Alcohol abuse Brother    Hyperlipidemia Brother    Colon cancer Maternal Grandmother    Esophageal cancer Neg Hx    Rectal cancer Neg Hx    Stomach cancer Neg Hx    Crohn's disease Neg Hx       Review of Systems  All other systems reviewed and are negative.      Objective:   Physical Exam Vitals reviewed.  Constitutional:      General: He is not in acute distress.    Appearance: He is well-developed. He is not diaphoretic.  HENT:     Head: Normocephalic and atraumatic.     Right Ear: External ear normal.     Left Ear: External ear normal.     Nose: Nose normal.     Mouth/Throat:      Pharynx: No oropharyngeal exudate.  Eyes:     General: No scleral icterus.       Right eye: No discharge.        Left eye: No discharge.     Conjunctiva/sclera: Conjunctivae normal.     Pupils: Pupils are equal, round, and reactive to light.  Neck:     Thyroid: No thyromegaly.     Vascular: No JVD.     Trachea: No tracheal deviation.  Cardiovascular:     Rate and Rhythm: Normal rate and regular rhythm.     Heart sounds: Normal heart sounds. No murmur heard.    No friction rub. No gallop.  Pulmonary:     Effort: Pulmonary effort is normal. No respiratory distress.     Breath sounds: Normal breath sounds. No stridor. No wheezing or rales.  Chest:     Chest wall: No tenderness.  Abdominal:     General: Bowel sounds are normal. There is no distension.     Palpations: Abdomen is soft. There is no mass.     Tenderness: There is no abdominal tenderness. There is no guarding or rebound.  Musculoskeletal:        General: No tenderness. Normal range of motion.     Cervical back: Normal range of motion and neck supple.  Lymphadenopathy:     Cervical: No cervical adenopathy.  Skin:  General: Skin is warm.     Coloration: Skin is not pale.     Findings: No erythema or rash.  Neurological:     Mental Status: He is alert and oriented to person, place, and time.     Cranial Nerves: No cranial nerve deficit.     Motor: No abnormal muscle tone.     Coordination: Coordination normal.     Deep Tendon Reflexes: Reflexes are normal and symmetric.  Psychiatric:        Behavior: Behavior normal.        Thought Content: Thought content normal.        Judgment: Judgment normal.           Assessment & Plan:  General medical exam  Pure hypercholesterolemia Patient received his tetanus shot today.  His cholesterol is mildly elevated however he had a coronary artery calcium score last year of 1 putting him in the 31st percentile for age.  Therefore I do not feel that we need to  aggressively treat his cholesterol at this point.  He will continue fish oil.  We will try diclofenac 75 mg twice daily as needed for hand pain due to osteoarthritis.  We will also try samples of Myrbetriq 25 mg a day for overactive bladder.  I will add a PSA to his lab work to screen for prostate cancer.  The remainder of his preventative care is up-to-date

## 2022-07-25 LAB — CBC WITH DIFFERENTIAL/PLATELET
Absolute Monocytes: 500 cells/uL (ref 200–950)
Basophils Absolute: 39 cells/uL (ref 0–200)
Basophils Relative: 0.8 %
Eosinophils Absolute: 230 cells/uL (ref 15–500)
Eosinophils Relative: 4.7 %
HCT: 43.4 % (ref 38.5–50.0)
Hemoglobin: 14.4 g/dL (ref 13.2–17.1)
Lymphs Abs: 1416 cells/uL (ref 850–3900)
MCH: 29.8 pg (ref 27.0–33.0)
MCHC: 33.2 g/dL (ref 32.0–36.0)
MCV: 89.7 fL (ref 80.0–100.0)
MPV: 10.3 fL (ref 7.5–12.5)
Monocytes Relative: 10.2 %
Neutro Abs: 2715 cells/uL (ref 1500–7800)
Neutrophils Relative %: 55.4 %
Platelets: 211 10*3/uL (ref 140–400)
RBC: 4.84 10*6/uL (ref 4.20–5.80)
RDW: 12.8 % (ref 11.0–15.0)
Total Lymphocyte: 28.9 %
WBC: 4.9 10*3/uL (ref 3.8–10.8)

## 2022-07-25 LAB — LIPID PANEL
Cholesterol: 188 mg/dL (ref <200)
HDL: 46 mg/dL (ref >=40)
LDL Cholesterol (Calc): 127 mg/dL (calc) — ABNORMAL HIGH
Non-HDL Cholesterol (Calc): 142 mg/dL (calc) — ABNORMAL HIGH (ref <130)
Total CHOL/HDL Ratio: 4.1 (calc) (ref <5.0)
Triglycerides: 60 mg/dL (ref <150)

## 2022-07-25 LAB — COMPLETE METABOLIC PANEL WITH GFR
AG Ratio: 2.2 (calc) (ref 1.0–2.5)
ALT: 18 U/L (ref 9–46)
AST: 20 U/L (ref 10–35)
Albumin: 4.4 g/dL (ref 3.6–5.1)
Alkaline phosphatase (APISO): 52 U/L (ref 35–144)
BUN: 25 mg/dL (ref 7–25)
CO2: 27 mmol/L (ref 20–32)
Calcium: 9.2 mg/dL (ref 8.6–10.3)
Chloride: 107 mmol/L (ref 98–110)
Creat: 0.72 mg/dL (ref 0.70–1.35)
Globulin: 2 g/dL (calc) (ref 1.9–3.7)
Glucose, Bld: 105 mg/dL — ABNORMAL HIGH (ref 65–99)
Potassium: 4.8 mmol/L (ref 3.5–5.3)
Sodium: 141 mmol/L (ref 135–146)
Total Bilirubin: 1 mg/dL (ref 0.2–1.2)
Total Protein: 6.4 g/dL (ref 6.1–8.1)
eGFR: 104 mL/min/{1.73_m2} (ref >=60)

## 2022-07-25 LAB — TEST AUTHORIZATION 2

## 2022-07-25 LAB — PSA: PSA: 3.06 ng/mL (ref <=4.00)

## 2022-07-25 LAB — TESTOSTERONE: Testosterone: 301 ng/dL (ref 250–827)

## 2022-07-28 ENCOUNTER — Encounter: Payer: Self-pay | Admitting: Family Medicine

## 2022-07-31 ENCOUNTER — Other Ambulatory Visit (HOSPITAL_COMMUNITY): Payer: Self-pay

## 2022-07-31 ENCOUNTER — Other Ambulatory Visit: Payer: Self-pay | Admitting: Family Medicine

## 2022-07-31 MED ORDER — ORLISTAT 120 MG PO CAPS
120.0000 mg | ORAL_CAPSULE | Freq: Three times a day (TID) | ORAL | 3 refills | Status: DC
  Filled 2022-07-31 – 2022-08-17 (×2): qty 90, 30d supply, fill #0

## 2022-08-03 ENCOUNTER — Other Ambulatory Visit: Payer: Self-pay

## 2022-08-03 ENCOUNTER — Other Ambulatory Visit (HOSPITAL_COMMUNITY): Payer: Self-pay

## 2022-08-03 ENCOUNTER — Telehealth: Payer: Self-pay

## 2022-08-03 NOTE — Telephone Encounter (Signed)
PA-Orlistat sent to plan Key#[id number]

## 2022-08-17 ENCOUNTER — Encounter: Payer: Self-pay | Admitting: Family Medicine

## 2022-08-17 ENCOUNTER — Other Ambulatory Visit: Payer: Self-pay

## 2022-08-17 ENCOUNTER — Other Ambulatory Visit (HOSPITAL_COMMUNITY): Payer: Self-pay

## 2022-08-20 ENCOUNTER — Other Ambulatory Visit (HOSPITAL_COMMUNITY): Payer: Self-pay

## 2022-08-20 ENCOUNTER — Other Ambulatory Visit: Payer: Self-pay

## 2022-08-21 ENCOUNTER — Other Ambulatory Visit (HOSPITAL_COMMUNITY): Payer: Self-pay

## 2022-08-23 ENCOUNTER — Other Ambulatory Visit (HOSPITAL_COMMUNITY): Payer: Self-pay

## 2022-08-23 NOTE — Telephone Encounter (Signed)
PA-Orlistat  Key#[id number]    Outcome.  Approved on March 27 The request has been approved. The authorization is effective from 08/15/2022 to 11/13/2022, as long as the member is enrolled in their current health plan The request was reviewed and approved by a licensed clinical pharmacist. This request has been approved for 90 capsules per 30 days. A written notification letter will follow with additional details.

## 2022-08-29 ENCOUNTER — Other Ambulatory Visit (HOSPITAL_COMMUNITY): Payer: Self-pay

## 2022-08-30 ENCOUNTER — Other Ambulatory Visit: Payer: Self-pay

## 2022-08-31 ENCOUNTER — Other Ambulatory Visit (HOSPITAL_COMMUNITY): Payer: Self-pay

## 2022-09-03 ENCOUNTER — Other Ambulatory Visit: Payer: Self-pay

## 2022-09-03 ENCOUNTER — Other Ambulatory Visit (HOSPITAL_COMMUNITY): Payer: Self-pay

## 2022-09-10 ENCOUNTER — Other Ambulatory Visit (HOSPITAL_COMMUNITY): Payer: Self-pay

## 2022-09-11 ENCOUNTER — Other Ambulatory Visit: Payer: Self-pay

## 2022-09-11 ENCOUNTER — Other Ambulatory Visit (HOSPITAL_COMMUNITY): Payer: Self-pay

## 2022-09-12 ENCOUNTER — Other Ambulatory Visit (HOSPITAL_COMMUNITY): Payer: Self-pay

## 2022-09-15 ENCOUNTER — Other Ambulatory Visit (HOSPITAL_COMMUNITY): Payer: Self-pay

## 2022-09-18 ENCOUNTER — Other Ambulatory Visit (HOSPITAL_COMMUNITY): Payer: Self-pay

## 2022-09-24 ENCOUNTER — Other Ambulatory Visit (HOSPITAL_COMMUNITY): Payer: Self-pay

## 2022-10-04 ENCOUNTER — Other Ambulatory Visit (HOSPITAL_COMMUNITY): Payer: Self-pay

## 2022-10-16 ENCOUNTER — Other Ambulatory Visit (HOSPITAL_COMMUNITY): Payer: Self-pay

## 2022-10-22 ENCOUNTER — Other Ambulatory Visit (HOSPITAL_COMMUNITY): Payer: Self-pay

## 2022-10-22 NOTE — Progress Notes (Signed)
LAB VISIT, SCHEDULED AS OFFICE VISIT IN ERROR.

## 2022-10-25 ENCOUNTER — Other Ambulatory Visit (HOSPITAL_COMMUNITY): Payer: Self-pay

## 2022-10-25 ENCOUNTER — Other Ambulatory Visit: Payer: Self-pay

## 2022-11-05 ENCOUNTER — Other Ambulatory Visit (HOSPITAL_COMMUNITY): Payer: Self-pay

## 2022-11-05 ENCOUNTER — Encounter: Payer: Self-pay | Admitting: Family Medicine

## 2022-11-06 ENCOUNTER — Telehealth: Payer: Self-pay

## 2022-11-06 NOTE — Telephone Encounter (Signed)
Pt has been unable to get his Orlistat since April due to supply issues. Is there anything else he can use? Thank you.

## 2022-11-08 ENCOUNTER — Other Ambulatory Visit (HOSPITAL_COMMUNITY): Payer: Self-pay

## 2022-11-12 ENCOUNTER — Other Ambulatory Visit: Payer: Self-pay

## 2022-11-19 ENCOUNTER — Other Ambulatory Visit (HOSPITAL_COMMUNITY): Payer: Self-pay

## 2022-11-21 ENCOUNTER — Other Ambulatory Visit (HOSPITAL_BASED_OUTPATIENT_CLINIC_OR_DEPARTMENT_OTHER): Payer: Self-pay

## 2022-11-21 MED ORDER — AMOXICILLIN 500 MG PO CAPS
500.0000 mg | ORAL_CAPSULE | ORAL | 0 refills | Status: DC
  Filled 2022-11-21: qty 28, 9d supply, fill #0

## 2022-12-02 ENCOUNTER — Other Ambulatory Visit: Payer: Self-pay | Admitting: Family Medicine

## 2022-12-03 ENCOUNTER — Other Ambulatory Visit (HOSPITAL_COMMUNITY): Payer: Self-pay

## 2022-12-03 ENCOUNTER — Other Ambulatory Visit: Payer: Self-pay

## 2022-12-03 MED ORDER — DICLOFENAC SODIUM 75 MG PO TBEC
75.0000 mg | DELAYED_RELEASE_TABLET | Freq: Two times a day (BID) | ORAL | 3 refills | Status: DC
  Filled 2022-12-03: qty 60, 30d supply, fill #0
  Filled 2023-01-12: qty 60, 30d supply, fill #1
  Filled 2023-03-06: qty 60, 30d supply, fill #2
  Filled 2023-04-01: qty 60, 30d supply, fill #3

## 2022-12-07 ENCOUNTER — Other Ambulatory Visit (HOSPITAL_COMMUNITY): Payer: Self-pay

## 2022-12-17 ENCOUNTER — Other Ambulatory Visit (HOSPITAL_COMMUNITY): Payer: Self-pay

## 2022-12-18 ENCOUNTER — Encounter (HOSPITAL_COMMUNITY): Payer: Self-pay

## 2022-12-18 ENCOUNTER — Other Ambulatory Visit (HOSPITAL_COMMUNITY): Payer: Self-pay

## 2022-12-18 ENCOUNTER — Encounter: Payer: Self-pay | Admitting: Family Medicine

## 2022-12-18 MED ORDER — CHLORHEXIDINE GLUCONATE 0.12 % MT SOLN
OROMUCOSAL | 0 refills | Status: DC
  Filled 2022-12-18: qty 473, 16d supply, fill #0

## 2022-12-18 MED ORDER — AMOXICILLIN 500 MG PO CAPS
ORAL_CAPSULE | ORAL | 1 refills | Status: DC
  Filled 2022-12-18: qty 30, 10d supply, fill #0

## 2022-12-20 ENCOUNTER — Other Ambulatory Visit: Payer: Self-pay

## 2022-12-20 ENCOUNTER — Other Ambulatory Visit (HOSPITAL_COMMUNITY): Payer: Self-pay

## 2022-12-20 ENCOUNTER — Other Ambulatory Visit: Payer: Self-pay | Admitting: Family Medicine

## 2022-12-20 MED ORDER — DULOXETINE HCL 30 MG PO CPEP
30.0000 mg | ORAL_CAPSULE | Freq: Every day | ORAL | 3 refills | Status: DC
  Filled 2022-12-20 (×2): qty 30, 30d supply, fill #0

## 2023-01-11 ENCOUNTER — Other Ambulatory Visit (HOSPITAL_COMMUNITY): Payer: Self-pay

## 2023-01-11 MED ORDER — DOXYCYCLINE HYCLATE 50 MG PO CAPS
50.0000 mg | ORAL_CAPSULE | Freq: Every day | ORAL | 1 refills | Status: DC
  Filled 2023-01-11: qty 30, 30d supply, fill #0

## 2023-01-11 MED ORDER — TRAMADOL HCL 50 MG PO TABS
50.0000 mg | ORAL_TABLET | ORAL | 1 refills | Status: DC | PRN
  Filled 2023-01-11: qty 12, 2d supply, fill #0

## 2023-01-12 ENCOUNTER — Other Ambulatory Visit: Payer: Self-pay | Admitting: Family Medicine

## 2023-01-12 DIAGNOSIS — E7800 Pure hypercholesterolemia, unspecified: Secondary | ICD-10-CM

## 2023-01-13 ENCOUNTER — Other Ambulatory Visit (HOSPITAL_COMMUNITY): Payer: Self-pay

## 2023-01-15 ENCOUNTER — Other Ambulatory Visit (HOSPITAL_COMMUNITY): Payer: Self-pay

## 2023-01-15 MED ORDER — EZETIMIBE 10 MG PO TABS
10.0000 mg | ORAL_TABLET | Freq: Every day | ORAL | 2 refills | Status: DC
Start: 2023-01-15 — End: 2023-10-13
  Filled 2023-01-15: qty 90, 90d supply, fill #0
  Filled 2023-04-01: qty 90, 90d supply, fill #1
  Filled 2023-07-17: qty 90, 90d supply, fill #2

## 2023-01-15 NOTE — Telephone Encounter (Signed)
Requested Prescriptions  Pending Prescriptions Disp Refills   ezetimibe (ZETIA) 10 MG tablet 90 tablet 2    Sig: Take 1 tablet (10 mg total) by mouth daily.     Cardiovascular:  Antilipid - Sterol Transport Inhibitors Failed - 01/12/2023  4:57 PM      Failed - Valid encounter within last 12 months    Recent Outpatient Visits           1 year ago General medical exam   [facility] Donita Brooks, MD   1 year ago External hemorrhoid   [facility] Tanya Nones, Priscille Heidelberg, MD   1 year ago Need for influenza vaccination   [facility] Donita Brooks, MD   2 years ago General medical exam   [facility] Donita Brooks, MD   2 years ago Sore throat   [facility] Donita Brooks, MD              Failed - Lipid Panel in normal range within the last 12 months    Cholesterol  Date Value Ref Range Status  07/20/2022 188 <200 mg/dL Final   LDL Cholesterol (Calc)  Date Value Ref Range Status  07/20/2022 127 (H) mg/dL (calc) Final    Comment:    Reference range: <100 . Desirable range <100 mg/dL for primary prevention;   <70 mg/dL for patients with CHD or diabetic patients  with > or = 2 CHD risk factors. Marland Kitchen LDL-C is now calculated using the Martin-Hopkins  calculation, which is a validated novel method providing  better accuracy than the Friedewald equation in the  estimation of LDL-C.  Horald Pollen et al. Lenox Ahr. 8295;621(30): 2061-2068  (http://education.[facility].com/faq/FAQ164)    HDL  Date Value Ref Range Status  07/20/2022 46 > OR = 40 mg/dL Final   Triglycerides  Date Value Ref Range Status  07/20/2022 60 <150 mg/dL Final         Passed - AST in normal range and within 360 days    AST  Date Value Ref Range Status  07/20/2022 20 10 - 35 U/L Final         Passed - ALT in normal range and within 360 days    ALT  Date Value Ref Range Status  07/20/2022 18 9 - 46 U/L Final          Passed - Patient is not pregnant

## 2023-01-23 ENCOUNTER — Encounter: Payer: Self-pay | Admitting: Family Medicine

## 2023-02-09 ENCOUNTER — Encounter: Payer: Self-pay | Admitting: Family Medicine

## 2023-02-12 ENCOUNTER — Other Ambulatory Visit: Payer: Self-pay | Admitting: Family Medicine

## 2023-02-12 ENCOUNTER — Other Ambulatory Visit (HOSPITAL_COMMUNITY): Payer: Self-pay

## 2023-02-12 ENCOUNTER — Other Ambulatory Visit: Payer: Self-pay

## 2023-02-12 MED ORDER — VENLAFAXINE HCL ER 75 MG PO CP24
75.0000 mg | ORAL_CAPSULE | Freq: Every day | ORAL | 3 refills | Status: DC
  Filled 2023-02-12 (×2): qty 30, 30d supply, fill #0
  Filled 2023-03-09: qty 30, 30d supply, fill #1

## 2023-02-15 ENCOUNTER — Ambulatory Visit (INDEPENDENT_AMBULATORY_CARE_PROVIDER_SITE_OTHER): Payer: 59

## 2023-02-15 DIAGNOSIS — Z23 Encounter for immunization: Secondary | ICD-10-CM | POA: Diagnosis not present

## 2023-03-06 ENCOUNTER — Other Ambulatory Visit: Payer: Self-pay

## 2023-03-09 ENCOUNTER — Other Ambulatory Visit (HOSPITAL_COMMUNITY): Payer: Self-pay

## 2023-03-13 DIAGNOSIS — H5203 Hypermetropia, bilateral: Secondary | ICD-10-CM | POA: Diagnosis not present

## 2023-04-02 ENCOUNTER — Other Ambulatory Visit: Payer: Self-pay

## 2023-05-07 ENCOUNTER — Encounter: Payer: Self-pay | Admitting: Family Medicine

## 2023-05-09 ENCOUNTER — Other Ambulatory Visit: Payer: Self-pay | Admitting: Family Medicine

## 2023-05-09 MED ORDER — METAXALONE 800 MG PO TABS
800.0000 mg | ORAL_TABLET | Freq: Three times a day (TID) | ORAL | 0 refills | Status: DC | PRN
  Filled 2023-05-09: qty 90, 30d supply, fill #0

## 2023-05-10 ENCOUNTER — Other Ambulatory Visit: Payer: Self-pay

## 2023-05-10 DIAGNOSIS — M9905 Segmental and somatic dysfunction of pelvic region: Secondary | ICD-10-CM | POA: Diagnosis not present

## 2023-05-10 DIAGNOSIS — M9903 Segmental and somatic dysfunction of lumbar region: Secondary | ICD-10-CM | POA: Diagnosis not present

## 2023-05-10 DIAGNOSIS — M545 Low back pain, unspecified: Secondary | ICD-10-CM | POA: Diagnosis not present

## 2023-05-10 DIAGNOSIS — M7918 Myalgia, other site: Secondary | ICD-10-CM | POA: Diagnosis not present

## 2023-05-10 DIAGNOSIS — M9904 Segmental and somatic dysfunction of sacral region: Secondary | ICD-10-CM | POA: Diagnosis not present

## 2023-05-17 DIAGNOSIS — M7918 Myalgia, other site: Secondary | ICD-10-CM | POA: Diagnosis not present

## 2023-05-17 DIAGNOSIS — M9905 Segmental and somatic dysfunction of pelvic region: Secondary | ICD-10-CM | POA: Diagnosis not present

## 2023-05-17 DIAGNOSIS — M9903 Segmental and somatic dysfunction of lumbar region: Secondary | ICD-10-CM | POA: Diagnosis not present

## 2023-05-17 DIAGNOSIS — M545 Low back pain, unspecified: Secondary | ICD-10-CM | POA: Diagnosis not present

## 2023-05-17 DIAGNOSIS — M9904 Segmental and somatic dysfunction of sacral region: Secondary | ICD-10-CM | POA: Diagnosis not present

## 2023-06-28 ENCOUNTER — Telehealth: Payer: Self-pay | Admitting: Family Medicine

## 2023-06-28 ENCOUNTER — Encounter: Payer: Self-pay | Admitting: Family Medicine

## 2023-06-28 NOTE — Telephone Encounter (Unsigned)
 Copied from CRM [id number]. Topic: Clinical - Request for Lab/Test Order >> Jun 28, 2023  3:00 PM Baldomero Bone wrote: Reason for CRM: Patient needs labs for his physical schedule 08/05/2023 at 8:00 AM. Callback number for wife, Coral Der is [phone]

## 2023-07-01 ENCOUNTER — Other Ambulatory Visit (HOSPITAL_COMMUNITY): Payer: Self-pay

## 2023-07-01 ENCOUNTER — Other Ambulatory Visit: Payer: 59

## 2023-07-01 ENCOUNTER — Other Ambulatory Visit: Payer: Self-pay | Admitting: Family Medicine

## 2023-07-01 ENCOUNTER — Encounter: Payer: Self-pay | Admitting: Family Medicine

## 2023-07-01 DIAGNOSIS — E7800 Pure hypercholesterolemia, unspecified: Secondary | ICD-10-CM

## 2023-07-01 DIAGNOSIS — R7303 Prediabetes: Secondary | ICD-10-CM | POA: Diagnosis not present

## 2023-07-01 DIAGNOSIS — R7989 Other specified abnormal findings of blood chemistry: Secondary | ICD-10-CM | POA: Diagnosis not present

## 2023-07-01 DIAGNOSIS — I712 Thoracic aortic aneurysm, without rupture, unspecified: Secondary | ICD-10-CM

## 2023-07-01 MED ORDER — METHOCARBAMOL 750 MG PO TABS
750.0000 mg | ORAL_TABLET | Freq: Four times a day (QID) | ORAL | 2 refills | Status: DC
  Filled 2023-07-01 – 2024-02-25 (×3): qty 120, 30d supply, fill #0

## 2023-07-02 ENCOUNTER — Other Ambulatory Visit (HOSPITAL_COMMUNITY): Payer: Self-pay

## 2023-07-02 LAB — CBC WITH DIFFERENTIAL/PLATELET
Absolute Lymphocytes: 1333 {cells}/uL (ref 850–3900)
Absolute Monocytes: 397 {cells}/uL (ref 200–950)
Basophils Absolute: 49 {cells}/uL (ref 0–200)
Basophils Relative: 1 %
Eosinophils Absolute: 250 {cells}/uL (ref 15–500)
Eosinophils Relative: 5.1 %
HCT: 44 % (ref 38.5–50.0)
Hemoglobin: 14.1 g/dL (ref 13.2–17.1)
MCH: 29.9 pg (ref 27.0–33.0)
MCHC: 32 g/dL (ref 32.0–36.0)
MCV: 93.4 fL (ref 80.0–100.0)
MPV: 10.2 fL (ref 7.5–12.5)
Monocytes Relative: 8.1 %
Neutro Abs: 2871 {cells}/uL (ref 1500–7800)
Neutrophils Relative %: 58.6 %
Platelets: 173 10*3/uL (ref 140–400)
RBC: 4.71 10*6/uL (ref 4.20–5.80)
RDW: 12.5 % (ref 11.0–15.0)
Total Lymphocyte: 27.2 %
WBC: 4.9 10*3/uL (ref 3.8–10.8)

## 2023-07-02 LAB — HEMOGLOBIN A1C
Hgb A1c MFr Bld: 5.7 %{Hb} — ABNORMAL HIGH (ref <5.7)
Mean Plasma Glucose: 117 mg/dL
eAG (mmol/L): 6.5 mmol/L

## 2023-07-02 LAB — COMPLETE METABOLIC PANEL WITH GFR
AG Ratio: 2.4 (calc) (ref 1.0–2.5)
ALT: 22 U/L (ref 9–46)
AST: 21 U/L (ref 10–35)
Albumin: 4.4 g/dL (ref 3.6–5.1)
Alkaline phosphatase (APISO): 55 U/L (ref 35–144)
BUN/Creatinine Ratio: 35 (calc) — ABNORMAL HIGH (ref 6–22)
BUN: 24 mg/dL (ref 7–25)
CO2: 26 mmol/L (ref 20–32)
Calcium: 9.2 mg/dL (ref 8.6–10.3)
Chloride: 107 mmol/L (ref 98–110)
Creat: 0.69 mg/dL — ABNORMAL LOW (ref 0.70–1.35)
Globulin: 1.8 g/dL — ABNORMAL LOW (ref 1.9–3.7)
Glucose, Bld: 103 mg/dL — ABNORMAL HIGH (ref 65–99)
Potassium: 4.5 mmol/L (ref 3.5–5.3)
Sodium: 141 mmol/L (ref 135–146)
Total Bilirubin: 0.6 mg/dL (ref 0.2–1.2)
Total Protein: 6.2 g/dL (ref 6.1–8.1)
eGFR: 105 mL/min/{1.73_m2} (ref >=60)

## 2023-07-02 LAB — LIPID PANEL
Cholesterol: 199 mg/dL (ref <200)
HDL: 53 mg/dL (ref >=40)
LDL Cholesterol (Calc): 131 mg/dL — ABNORMAL HIGH
Non-HDL Cholesterol (Calc): 146 mg/dL — ABNORMAL HIGH (ref <130)
Total CHOL/HDL Ratio: 3.8 (calc) (ref <5.0)
Triglycerides: 62 mg/dL (ref <150)

## 2023-07-02 LAB — TESTOSTERONE: Testosterone: 292 ng/dL (ref 250–827)

## 2023-07-07 ENCOUNTER — Other Ambulatory Visit: Payer: Self-pay | Admitting: Family Medicine

## 2023-07-07 ENCOUNTER — Encounter: Payer: Self-pay | Admitting: Family Medicine

## 2023-07-08 ENCOUNTER — Other Ambulatory Visit (HOSPITAL_COMMUNITY): Payer: Self-pay

## 2023-07-08 ENCOUNTER — Other Ambulatory Visit: Payer: Self-pay

## 2023-07-08 MED ORDER — DICLOFENAC SODIUM 75 MG PO TBEC
75.0000 mg | DELAYED_RELEASE_TABLET | Freq: Two times a day (BID) | ORAL | 3 refills | Status: DC
  Filled 2023-07-08 (×4): qty 60, 30d supply, fill #0

## 2023-07-08 MED ORDER — DICLOFENAC SODIUM 75 MG PO TBEC
75.0000 mg | DELAYED_RELEASE_TABLET | Freq: Two times a day (BID) | ORAL | 3 refills | Status: DC
  Filled 2023-07-08: qty 60, 30d supply, fill #0

## 2023-07-10 ENCOUNTER — Telehealth: Payer: Self-pay

## 2023-07-10 NOTE — Telephone Encounter (Signed)
 Copied from CRM [id number]. Topic: Clinical - Request for Lab/Test Order >> Jul 10, 2023 10:47 AM Bobbye Morton wrote: Reason for CRM: Jodene Nam called in to get an update on a order request for pt. Requesting callback [phone] Option 1 option 4 and ask for Tanzi.

## 2023-07-11 ENCOUNTER — Other Ambulatory Visit: Payer: Self-pay | Admitting: Family Medicine

## 2023-07-11 DIAGNOSIS — I712 Thoracic aortic aneurysm, without rupture, unspecified: Secondary | ICD-10-CM

## 2023-07-12 ENCOUNTER — Other Ambulatory Visit: Payer: 59

## 2023-07-15 ENCOUNTER — Ambulatory Visit
Admission: RE | Admit: 2023-07-15 | Discharge: 2023-07-15 | Disposition: A | Discharge status: Home / Self Care | Payer: 59 | Source: Ambulatory Visit | Attending: Family Medicine | Admitting: Family Medicine

## 2023-07-15 ENCOUNTER — Other Ambulatory Visit: Payer: 59

## 2023-07-15 DIAGNOSIS — I712 Thoracic aortic aneurysm, without rupture, unspecified: Secondary | ICD-10-CM

## 2023-07-15 MED ORDER — IOPAMIDOL (ISOVUE-370) INJECTION 76%
75.0000 mL | Freq: Once | INTRAVENOUS | Status: AC | PRN
  Administered 2023-07-15: 75 mL via INTRAVENOUS

## 2023-07-17 ENCOUNTER — Encounter: Payer: Self-pay | Admitting: Family Medicine

## 2023-07-17 ENCOUNTER — Other Ambulatory Visit (HOSPITAL_COMMUNITY): Payer: Self-pay

## 2023-07-19 ENCOUNTER — Other Ambulatory Visit (HOSPITAL_COMMUNITY): Payer: Self-pay

## 2023-07-19 ENCOUNTER — Encounter: Payer: Self-pay | Admitting: Family Medicine

## 2023-07-19 ENCOUNTER — Ambulatory Visit (INDEPENDENT_AMBULATORY_CARE_PROVIDER_SITE_OTHER): Payer: 59 | Admitting: Family Medicine

## 2023-07-19 VITALS — BP 118/68 | HR 72 | Temp 98.2°F | Ht 73.0 in | Wt 223.0 lb

## 2023-07-19 DIAGNOSIS — H04301 Unspecified dacryocystitis of right lacrimal passage: Secondary | ICD-10-CM | POA: Diagnosis not present

## 2023-07-19 MED ORDER — DOXYCYCLINE HYCLATE 100 MG PO TABS
100.0000 mg | ORAL_TABLET | Freq: Two times a day (BID) | ORAL | 0 refills | Status: DC
  Filled 2023-07-19: qty 14, 7d supply, fill #0

## 2023-07-19 NOTE — Progress Notes (Signed)
 Subjective:    [name], male    DOB: Mar 09, 1961, 63 y.o.   MRN: [id number]  HPI Patient reports tenderness and swelling in his right upper eyelid.  The eyelid is red and swollen at the corner of the eye adjacent to the nose.  There is puffiness and tenderness in that area.  There is no pain with extraocular movement.  He appears to be developing a slight cellulitis near the tear duct on the upper eyelid Past Medical History:  Diagnosis Date   Arthritis    bilateral,hands,hips   Ascending aortic aneurysm ([facility])    Hx of adenomatous polyp of colon 07/08/2014   Hyperlipidemia    Low back pain    Past Surgical History:  Procedure Laterality Date   COLONOSCOPY  09/17/2003   normal dr Laural Benes   HERNIA REPAIR     1971   POLYPECTOMY     Current Outpatient Medications on File Prior to Visit  Medication Sig Dispense Refill   aspirin 81 MG tablet Take 81 mg by mouth daily.     calcium carbonate (CALCIUM 600) 600 MG TABS tablet Take 1 tablet (600 mg total) by mouth 2 (two) times daily with a meal. 30 tablet    chlorhexidine (PERIDEX) 0.12 % solution RINSE 15 ML TWICE DAILY AFTER BREAKFAST AND BEFORE BEDTIME 473 mL 0   Cholecalciferol (VITAMIN D3) 50 MCG (2000 UT) capsule Take 1 capsule (2,000 Units total) by mouth daily.     diclofenac (VOLTAREN) 75 MG EC tablet Take 1 tablet (75 mg total) by mouth 2 (two) times daily. 60 tablet 3   ezetimibe (ZETIA) 10 MG tablet Take 1 tablet (10 mg total) by mouth daily. 90 tablet 2   methocarbamol (ROBAXIN-750) 750 MG tablet Take 1 tablet (750 mg total) by mouth 4 (four) times daily. 120 tablet 2   Multiple Vitamin (MULTIVITAMIN) tablet Take 1 tablet by mouth daily.     Omega-3 Fatty Acids (FISH OIL CONCENTRATE PO) Take by mouth.     orlistat (XENICAL) 120 MG capsule Take 1 capsule (120 mg total) by mouth 3 (three) times daily with meals. 90 capsule 3   traMADol (ULTRAM) 50 MG tablet Take 1 tablet (50 mg total) by mouth every 4-6 hours  as needed for pain 12 tablet 1   venlafaxine XR (EFFEXOR XR) 75 MG 24 hr capsule Take 1 capsule (75 mg total) by mouth daily with breakfast. 30 capsule 3   No current facility-administered medications on file prior to visit.   Allergies  Allergen Reactions   Niaspan [Niacin Er (Antihyperlipidemic)]     Severe flushing   Social History   Socioeconomic History   Marital status: Married    Spouse name: Not on file   Number of children: Not on file   Years of education: Not on file   Highest education level: 12th grade  Occupational History   Not on file  Tobacco Use   Smoking status: Never    Passive exposure: Never   Smokeless tobacco: Never  Vaping Use   Vaping status: Never Used  Substance and Sexual Activity   Alcohol use: No    Alcohol/week: 0.0 standard drinks of alcohol   Drug use: No   Sexual activity: Yes    Comment: married.  Works in Data processing manager, Set designer  Other Topics Concern   Not on file  Social History Narrative   Not on file   Social Drivers of Health   Financial Resource Strain: Low  Risk  (07/18/2023)   Overall Financial Resource Strain (CARDIA)    Difficulty of Paying Living Expenses: Not hard at all  Food Insecurity: No Food Insecurity (07/18/2023)   Hunger Vital Sign    Worried About Running Out of Food in the Last Year: Never true    Ran Out of Food in the Last Year: Never true  Transportation Needs: No Transportation Needs (07/18/2023)   PRAPARE - Administrator, Civil Service (Medical): No    Lack of Transportation (Non-Medical): No  Physical Activity: Unknown (07/18/2023)   Exercise Vital Sign    Days of Exercise per Week: 5 days    Minutes of Exercise per Session: Not on file  Stress: Stress Concern Present (07/18/2023)   [employer] of Occupational Health - Occupational Stress Questionnaire    Feeling of Stress : Very much  Social Connections: Socially Integrated (07/18/2023)   Social Connection and Isolation Panel  [NHANES]    Frequency of Communication with Friends and Family: More than three times a week    Frequency of Social Gatherings with Friends and Family: Once a week    Attends Religious Services: 1 to 4 times per year    Active Member of [employer] or Organizations: Yes    Attends Banker Meetings: 1 to 4 times per year    Marital Status: Married  Catering manager Violence: Not on file      Review of Systems  All other systems reviewed and are negative.      Objective:   Physical Exam Vitals reviewed.  HENT:     Head: Normocephalic and atraumatic.     Jaw: There is normal jaw occlusion.     Salivary Glands: Right salivary gland is not diffusely enlarged or tender. Left salivary gland is not diffusely enlarged or tender.     Right Ear: Hearing, tympanic membrane and ear canal normal.     Left Ear: Hearing, tympanic membrane and ear canal normal.     Nose:     Right Turbinates: Not swollen.     Left Turbinates: Not swollen.     Right Sinus: No maxillary sinus tenderness or frontal sinus tenderness.     Left Sinus: No maxillary sinus tenderness or frontal sinus tenderness.     Mouth/Throat:     Mouth: No oral lesions.     Pharynx: No pharyngeal swelling, oropharyngeal exudate or posterior oropharyngeal erythema.     Tonsils: No tonsillar exudate or tonsillar abscesses.  Eyes:     General:        Right eye: No discharge or hordeolum.        Left eye: No discharge or hordeolum.     Conjunctiva/sclera:     Right eye: No exudate or hemorrhage.    Left eye: No exudate or hemorrhage.  Cardiovascular:     Rate and Rhythm: Normal rate and regular rhythm.     Heart sounds: Normal heart sounds.  Pulmonary:     Effort: Pulmonary effort is normal.     Breath sounds: Normal breath sounds.  Abdominal:     General: Bowel sounds are normal. There is no distension.     Palpations: Abdomen is soft.     Tenderness: There is no abdominal tenderness. There is no guarding or  rebound.     Hernia: There is no hernia in the left inguinal area.  Musculoskeletal:     Cervical back: Neck supple.  Assessment & Plan:  Patient has early dacryocystitis versus cellulitis in the upper eyelid.  Begin doxycycline 100 mg twice daily for 7 days.  Use warm compresses 3 times daily.  Recheck next week if no better or sooner if worse

## 2023-08-05 ENCOUNTER — Encounter: Payer: Self-pay | Admitting: Family Medicine

## 2023-08-05 ENCOUNTER — Ambulatory Visit: Payer: 59 | Admitting: Family Medicine

## 2023-08-05 VITALS — BP 130/82 | HR 72 | Temp 97.6°F | Ht 73.0 in | Wt 225.8 lb

## 2023-08-05 DIAGNOSIS — E7800 Pure hypercholesterolemia, unspecified: Secondary | ICD-10-CM | POA: Diagnosis not present

## 2023-08-05 DIAGNOSIS — Z125 Encounter for screening for malignant neoplasm of prostate: Secondary | ICD-10-CM

## 2023-08-05 DIAGNOSIS — Z23 Encounter for immunization: Secondary | ICD-10-CM

## 2023-08-05 DIAGNOSIS — I712 Thoracic aortic aneurysm, without rupture, unspecified: Secondary | ICD-10-CM | POA: Diagnosis not present

## 2023-08-05 DIAGNOSIS — Z0001 Encounter for general adult medical examination with abnormal findings: Secondary | ICD-10-CM | POA: Diagnosis not present

## 2023-08-05 DIAGNOSIS — Z0000 Encounter for general adult medical examination without abnormal findings: Secondary | ICD-10-CM

## 2023-08-05 NOTE — Progress Notes (Signed)
 Subjective:    [name], male    DOB: [birth year], 62 y.o.   MRN: [id number]  HPI  Patient is a very pleasant 63 year old Caucasian gentleman here today for complete physical exam.  Patient had a colonoscopy in 2023.  It showed 1 diminutive polyp.  Recommended repeat colonoscopy in 7 years.  Patient denies any concerns.  He is due for the tetanus shot.  He is also due for the pneumonia vaccine.  He defers the pneumonia vaccine today but he would like to get his tetanus shot. Immunization History  Administered Date(s) Administered   Influenza, Seasonal, Injecte, Preservative Fre 02/15/2023   Influenza,inj,Quad PF,6+ Mos 04/07/2013, 02/18/2014, 02/15/2015, 02/13/2016, 02/06/2017, 02/11/2018, 02/03/2019, 02/25/2020, 03/21/2021, 01/26/2022   PFIZER(Purple Top)SARS-COV-2 Vaccination 08/01/2019, 08/24/2019, 03/19/2020   Td 07/27/2003   Tdap 04/07/2013   Zoster Recombinant(Shingrix) 02/11/2018, 04/21/2018, 05/05/2018   Lab on 07/01/2023  Component Date Value Ref Range Status   WBC 07/01/2023 4.9  3.8 - 10.8 Thousand/uL Final   RBC 07/01/2023 4.71  4.20 - 5.80 Million/uL Final   Hemoglobin 07/01/2023 14.1  13.2 - 17.1 g/dL Final   HCT 78/29/5621 44.0  38.5 - 50.0 % Final   MCV 07/01/2023 93.4  80.0 - 100.0 fL Final   MCH 07/01/2023 29.9  27.0 - 33.0 pg Final   MCHC 07/01/2023 32.0  32.0 - 36.0 g/dL Final   Comment: For adults, a slight decrease in the calculated MCHC value (in the range of 30 to 32 g/dL) is most likely not clinically significant; however, it should be interpreted with caution in correlation with other red cell parameters and the patient's clinical condition.    RDW 07/01/2023 12.5  11.0 - 15.0 % Final   Platelets 07/01/2023 173  140 - 400 Thousand/uL Final   MPV 07/01/2023 10.2  7.5 - 12.5 fL Final   Neutro Abs 07/01/2023 2,871  1,500 - 7,800 cells/uL Final   Absolute Lymphocytes 07/01/2023 1,333  850 - 3,900 cells/uL Final   Absolute Monocytes  07/01/2023 397  200 - 950 cells/uL Final   Eosinophils Absolute 07/01/2023 250  15 - 500 cells/uL Final   Basophils Absolute 07/01/2023 49  0 - 200 cells/uL Final   Neutrophils Relative % 07/01/2023 58.6  % Final   Total Lymphocyte 07/01/2023 27.2  % Final   Monocytes Relative 07/01/2023 8.1  % Final   Eosinophils Relative 07/01/2023 5.1  % Final   Basophils Relative 07/01/2023 1.0  % Final   Glucose, Bld 07/01/2023 103 (H)  65 - 99 mg/dL Final   Comment: .            Fasting reference interval . For someone without known diabetes, a glucose value between 100 and 125 mg/dL is consistent with prediabetes and should be confirmed with a follow-up test. .    BUN 07/01/2023 24  7 - 25 mg/dL Final   Creat 30/86/5784 0.69 (L)  0.70 - 1.35 mg/dL Final   eGFR 69/62/9528 105  > OR = 60 mL/min/1.39m2 Final   BUN/Creatinine Ratio 07/01/2023 35 (H)  6 - 22 (calc) Final   Sodium 07/01/2023 141  135 - 146 mmol/L Final   Potassium 07/01/2023 4.5  3.5 - 5.3 mmol/L Final   Chloride 07/01/2023 107  98 - 110 mmol/L Final   CO2 07/01/2023 26  20 - 32 mmol/L Final   Calcium 07/01/2023 9.2  8.6 - 10.3 mg/dL Final   Total Protein 41/32/4401 6.2  6.1 - 8.1 g/dL Final  Albumin 07/01/2023 4.4  3.6 - 5.1 g/dL Final   Globulin 27/25/3664 1.8 (L)  1.9 - 3.7 g/dL (calc) Final   AG Ratio 07/01/2023 2.4  1.0 - 2.5 (calc) Final   Total Bilirubin 07/01/2023 0.6  0.2 - 1.2 mg/dL Final   Alkaline phosphatase (APISO) 07/01/2023 55  35 - 144 U/L Final   AST 07/01/2023 21  10 - 35 U/L Final   ALT 07/01/2023 22  9 - 46 U/L Final   Cholesterol 07/01/2023 199  <200 mg/dL Final   HDL 40/34/7425 53  > OR = 40 mg/dL Final   Triglycerides 95/63/8756 62  <150 mg/dL Final   LDL Cholesterol (Calc) 07/01/2023 131 (H)  mg/dL (calc) Final   Comment: Reference range: <100 . Desirable range <100 mg/dL for primary prevention;   <70 mg/dL for patients with CHD or diabetic patients  with > or = 2 CHD risk factors. Marland Kitchen LDL-C is  now calculated using the Martin-Hopkins  calculation, which is a validated novel method providing  better accuracy than the Friedewald equation in the  estimation of LDL-C.  Horald Pollen et al. Lenox Ahr. 4332;951(88): 2061-2068  (http://education.[facility].com/faq/FAQ164)    Total CHOL/HDL Ratio 07/01/2023 3.8  <4.1 (calc) Final   Non-HDL Cholesterol (Calc) 07/01/2023 146 (H)  <130 mg/dL (calc) Final   Comment: For patients with diabetes plus 1 major ASCVD risk  factor, treating to a non-HDL-C goal of <100 mg/dL  (LDL-C of <66 mg/dL) is considered a therapeutic  option.    Hgb A1c MFr Bld 07/01/2023 5.7 (H)  <5.7 % of total Hgb Final   Comment: For someone without known diabetes, a hemoglobin  A1c value between 5.7% and 6.4% is consistent with prediabetes and should be confirmed with a  follow-up test. . For someone with known diabetes, a value <7% indicates that their diabetes is well controlled. A1c targets should be individualized based on duration of diabetes, age, comorbid conditions, and other considerations. . This assay result is consistent with an increased risk of diabetes. . Currently, no consensus exists regarding use of hemoglobin A1c for diagnosis of diabetes for children. .    Mean Plasma Glucose 07/01/2023 117  mg/dL Final   eAG (mmol/L) 11/18/1599 6.5  mmol/L Final   Testosterone 07/01/2023 292  250 - 827 ng/dL Final    Past Medical History:  Diagnosis Date   Arthritis    bilateral,hands,hips   Ascending aortic aneurysm (HCC)    Hx of adenomatous polyp of colon 07/08/2014   Hyperlipidemia    Low back pain    Past Surgical History:  Procedure Laterality Date   COLONOSCOPY  09/17/2003   normal dr Laural Benes   HERNIA REPAIR     1971   POLYPECTOMY     Current Outpatient Medications on File Prior to Visit  Medication Sig Dispense Refill   aspirin 81 MG tablet Take 81 mg by mouth daily.     calcium carbonate (CALCIUM 600) 600 MG TABS tablet Take 1  tablet (600 mg total) by mouth 2 (two) times daily with a meal. 30 tablet    Cholecalciferol (VITAMIN D3) 50 MCG (2000 UT) capsule Take 1 capsule (2,000 Units total) by mouth daily.     diclofenac (VOLTAREN) 75 MG EC tablet Take 1 tablet (75 mg total) by mouth 2 (two) times daily. 60 tablet 3   ezetimibe (ZETIA) 10 MG tablet Take 1 tablet (10 mg total) by mouth daily. 90 tablet 2   methocarbamol (ROBAXIN-750) 750 MG tablet Take 1  tablet (750 mg total) by mouth 4 (four) times daily. 120 tablet 2   Multiple Vitamin (MULTIVITAMIN) tablet Take 1 tablet by mouth daily.     Omega-3 Fatty Acids (FISH OIL CONCENTRATE PO) Take by mouth.     orlistat (XENICAL) 120 MG capsule Take 1 capsule (120 mg total) by mouth 3 (three) times daily with meals. 90 capsule 3   No current facility-administered medications on file prior to visit.   Allergies  Allergen Reactions   Niaspan [Niacin Er (Antihyperlipidemic)]     Severe flushing   Social History   Socioeconomic History   Marital status: Married    Spouse name: Not on file   Number of children: Not on file   Years of education: Not on file   Highest education level: 12th grade  Occupational History   Not on file  Tobacco Use   Smoking status: Never    Passive exposure: Never   Smokeless tobacco: Never  Vaping Use   Vaping status: Never Used  Substance and Sexual Activity   Alcohol use: No    Alcohol/week: 0.0 standard drinks of alcohol   Drug use: No   Sexual activity: Yes    Comment: married.  Works in Data processing manager, Set designer  Other Topics Concern   Not on file  Social History Narrative   Not on file   Social Drivers of Health   Financial Resource Strain: Low Risk  (07/18/2023)   Overall Financial Resource Strain (CARDIA)    Difficulty of Paying Living Expenses: Not hard at all  Food Insecurity: No Food Insecurity (07/18/2023)   Hunger Vital Sign    Worried About Running Out of Food in the Last Year: Never true    Ran Out of Food  in the Last Year: Never true  Transportation Needs: No Transportation Needs (07/18/2023)   PRAPARE - Administrator, Civil Service (Medical): No    Lack of Transportation (Non-Medical): No  Physical Activity: Unknown (07/18/2023)   Exercise Vital Sign    Days of Exercise per Week: 5 days    Minutes of Exercise per Session: Not on file  Stress: Stress Concern Present (07/18/2023)   [employer] of Occupational Health - Occupational Stress Questionnaire    Feeling of Stress : Very much  Social Connections: Socially Integrated (07/18/2023)   Social Connection and Isolation Panel [NHANES]    Frequency of Communication with Friends and Family: More than three times a week    Frequency of Social Gatherings with Friends and Family: Once a week    Attends Religious Services: 1 to 4 times per year    Active Member of [employer] or Organizations: Yes    Attends Banker Meetings: 1 to 4 times per year    Marital Status: Married  Catering manager Violence: Not on file   Family History  Problem Relation Age of Onset   Diabetes Father    Colon polyps Brother    Alcohol abuse Brother    Hyperlipidemia Brother    Colon cancer Maternal Grandmother    Esophageal cancer Neg Hx    Rectal cancer Neg Hx    Stomach cancer Neg Hx    Crohn's disease Neg Hx       Review of Systems  All other systems reviewed and are negative.      Objective:   Physical Exam Vitals reviewed.  Constitutional:      General: He is not in acute distress.    Appearance: He is well-developed.  He is not diaphoretic.  HENT:     Head: Normocephalic and atraumatic.     Right Ear: External ear normal.     Left Ear: External ear normal.     Nose: Nose normal.     Mouth/Throat:     Pharynx: No oropharyngeal exudate.  Eyes:     General: No scleral icterus.       Right eye: No discharge.        Left eye: No discharge.     Conjunctiva/sclera: Conjunctivae normal.     Pupils: Pupils are equal,  round, and reactive to light.  Neck:     Thyroid: No thyromegaly.     Vascular: No JVD.     Trachea: No tracheal deviation.  Cardiovascular:     Rate and Rhythm: Normal rate and regular rhythm.     Heart sounds: Normal heart sounds. No murmur heard.    No friction rub. No gallop.  Pulmonary:     Effort: Pulmonary effort is normal. No respiratory distress.     Breath sounds: Normal breath sounds. No stridor. No wheezing or rales.  Chest:     Chest wall: No tenderness.  Abdominal:     General: Bowel sounds are normal. There is no distension.     Palpations: Abdomen is soft. There is no mass.     Tenderness: There is no abdominal tenderness. There is no guarding or rebound.  Musculoskeletal:        General: No tenderness. Normal range of motion.     Cervical back: Normal range of motion and neck supple.  Lymphadenopathy:     Cervical: No cervical adenopathy.  Skin:    General: Skin is warm.     Coloration: Skin is not pale.     Findings: No erythema or rash.  Neurological:     Mental Status: He is alert and oriented to person, place, and time.     Cranial Nerves: No cranial nerve deficit.     Motor: No abnormal muscle tone.     Coordination: Coordination normal.     Deep Tendon Reflexes: Reflexes are normal and symmetric.  Psychiatric:        Behavior: Behavior normal.        Thought Content: Thought content normal.        Judgment: Judgment normal.           Assessment & Plan:  General medical exam  Pure hypercholesterolemia  Thoracic aortic aneurysm without rupture, unspecified part (HCC)  Prostate cancer screening - Plan: PSA  Need for vaccination - Plan: Tdap vaccine greater than or equal to 7yo IM Patient's exam today is completely normal.  Blood pressure is excellent.  His cholesterol is elevated however his coronary artery calcium score was almost 02 years ago.  Therefore I will except his cholesterol where it is.  Check a PSA today to screen for prostate  cancer.  Colonoscopy is up-to-date.  He received his tetanus shot today.

## 2023-08-06 LAB — PSA: PSA: 3.08 ng/mL (ref <=4.00)

## 2023-08-16 ENCOUNTER — Encounter: Payer: Self-pay | Admitting: Family Medicine

## 2023-08-26 ENCOUNTER — Other Ambulatory Visit (HOSPITAL_COMMUNITY): Payer: Self-pay

## 2023-08-26 ENCOUNTER — Other Ambulatory Visit: Payer: Self-pay | Admitting: Family Medicine

## 2023-08-26 MED ORDER — CELECOXIB 200 MG PO CAPS
200.0000 mg | ORAL_CAPSULE | Freq: Two times a day (BID) | ORAL | 3 refills | Status: DC
  Filled 2023-08-26: qty 60, 30d supply, fill #0
  Filled 2023-09-20: qty 60, 30d supply, fill #1
  Filled 2023-10-21: qty 60, 30d supply, fill #2
  Filled 2023-11-25: qty 60, 30d supply, fill #3

## 2023-08-30 ENCOUNTER — Encounter (HOSPITAL_COMMUNITY): Payer: Self-pay

## 2023-09-02 ENCOUNTER — Other Ambulatory Visit (HOSPITAL_COMMUNITY): Payer: Self-pay

## 2023-09-16 ENCOUNTER — Encounter: Payer: Self-pay | Admitting: Family Medicine

## 2023-09-16 ENCOUNTER — Other Ambulatory Visit (HOSPITAL_COMMUNITY): Payer: Self-pay

## 2023-09-16 ENCOUNTER — Ambulatory Visit: Admitting: Family Medicine

## 2023-09-16 VITALS — BP 124/72 | HR 67 | Temp 98.2°F | Ht 73.0 in | Wt 224.0 lb

## 2023-09-16 DIAGNOSIS — I872 Venous insufficiency (chronic) (peripheral): Secondary | ICD-10-CM | POA: Diagnosis not present

## 2023-09-16 MED ORDER — TRIAMCINOLONE ACETONIDE 0.1 % EX CREA
1.0000 | TOPICAL_CREAM | Freq: Two times a day (BID) | CUTANEOUS | 11 refills | Status: DC
  Filled 2023-09-16 – 2024-02-25 (×2): qty 30, 15d supply, fill #0

## 2023-09-16 NOTE — Progress Notes (Signed)
 Subjective:    [name], male    DOB: 23-Jun-1960, 63 y.o.   MRN: [id number]  Rash  Extremity Weakness   Patient has a light brown hyperpigmented rash on the anterior surface of both shins.  Rash is a macular patch like rash that itches.  It is directly over top of several large varicose veins.  Patient denies any ulcer formation.  He denies any bleeding.  He denies any pain.  The patch is a roughly 1 cm and 2 cm in diameter.  They have irregular borders.  There is no scale.  Past Medical History:  Diagnosis Date   Arthritis    bilateral,hands,hips   Ascending aortic aneurysm (HCC)    Hx of adenomatous polyp of colon 07/08/2014   Hyperlipidemia    Low back pain    Past Surgical History:  Procedure Laterality Date   COLONOSCOPY  09/17/2003   normal dr Lincoln Renshaw   HERNIA REPAIR     1971   POLYPECTOMY     Current Outpatient Medications on File Prior to Visit  Medication Sig Dispense Refill   celecoxib  (CELEBREX ) 200 MG capsule Take 1 capsule (200 mg total) by mouth 2 (two) times daily. 60 capsule 3   Red Yeast Rice Extract 600 MG CAPS red yeast rice  1,200 mg twice a day     aspirin 81 MG tablet Take 81 mg by mouth daily.     calcium  carbonate (CALCIUM  600) 600 MG TABS tablet Take 1 tablet (600 mg total) by mouth 2 (two) times daily with a meal. 30 tablet    Cholecalciferol (VITAMIN D3) 50 MCG (2000 UT) capsule Take 1 capsule (2,000 Units total) by mouth daily.     ezetimibe  (ZETIA ) 10 MG tablet Take 1 tablet (10 mg total) by mouth daily. 90 tablet 2   methocarbamol  (ROBAXIN -750) 750 MG tablet Take 1 tablet (750 mg total) by mouth 4 (four) times daily. 120 tablet 2   Multiple Vitamin (MULTIVITAMIN) tablet Take 1 tablet by mouth daily.     Omega-3 Fatty Acids (FISH OIL CONCENTRATE PO) Take by mouth.     orlistat  (XENICAL ) 120 MG capsule Take 1 capsule (120 mg total) by mouth 3 (three) times daily with meals. 90 capsule 3   No current facility-administered  medications on file prior to visit.   Allergies  Allergen Reactions   Niaspan [Niacin Er (Antihyperlipidemic)]     Severe flushing   Social History   Socioeconomic History   Marital status: Married    Spouse name: Not on file   Number of children: Not on file   Years of education: Not on file   Highest education level: 12th grade  Occupational History   Not on file  Tobacco Use   Smoking status: Never    Passive exposure: Never   Smokeless tobacco: Never  Vaping Use   Vaping status: Never Used  Substance and Sexual Activity   Alcohol use: No    Alcohol/week: 0.0 standard drinks of alcohol   Drug use: No   Sexual activity: Yes    Comment: married.  Works in Data processing manager, Set designer  Other Topics Concern   Not on file  Social History Narrative   Not on file   Social Drivers of Health   Financial Resource Strain: Low Risk  (07/18/2023)   Overall Financial Resource Strain (CARDIA)    Difficulty of Paying Living Expenses: Not hard at all  Food Insecurity: No Food Insecurity (07/18/2023)   Hunger  Vital Sign    Worried About Programme researcher, broadcasting/film/video in the Last Year: Never true    Ran Out of Food in the Last Year: Never true  Transportation Needs: No Transportation Needs (07/18/2023)   PRAPARE - Administrator, Civil Service (Medical): No    Lack of Transportation (Non-Medical): No  Physical Activity: Unknown (07/18/2023)   Exercise Vital Sign    Days of Exercise per Week: 5 days    Minutes of Exercise per Session: Not on file  Stress: Stress Concern Present (07/18/2023)   [employer] of Occupational Health - Occupational Stress Questionnaire    Feeling of Stress : Very much  Social Connections: Socially Integrated (07/18/2023)   Social Connection and Isolation Panel [NHANES]    Frequency of Communication with Friends and Family: More than three times a week    Frequency of Social Gatherings with Friends and Family: Once a week    Attends Religious Services:  1 to 4 times per year    Active Member of [employer] or Organizations: Yes    Attends Banker Meetings: 1 to 4 times per year    Marital Status: Married  Catering manager Violence: Not on file      Review of Systems  Musculoskeletal:  Positive for extremity weakness.  Skin:  Positive for rash.  All other systems reviewed and are negative.      Objective:   Physical Exam Vitals reviewed.  HENT:     Head: Normocephalic and atraumatic.     Jaw: There is normal jaw occlusion.     Salivary Glands: Right salivary gland is not diffusely enlarged or tender. Left salivary gland is not diffusely enlarged or tender.     Right Ear: Hearing normal.     Left Ear: Hearing normal.     Nose:     Right Turbinates: Not swollen.     Left Turbinates: Not swollen.     Right Sinus: No maxillary sinus tenderness or frontal sinus tenderness.     Left Sinus: No maxillary sinus tenderness or frontal sinus tenderness.     Mouth/Throat:     Mouth: No oral lesions.     Pharynx: No pharyngeal swelling.     Tonsils: No tonsillar exudate or tonsillar abscesses.  Eyes:     Conjunctiva/sclera:     Right eye: No exudate or hemorrhage.    Left eye: No exudate or hemorrhage. Cardiovascular:     Rate and Rhythm: Normal rate and regular rhythm.     Heart sounds: Normal heart sounds.  Pulmonary:     Effort: Pulmonary effort is normal.     Breath sounds: Normal breath sounds.  Abdominal:     Hernia: There is no hernia in the left inguinal area.  Musculoskeletal:     Cervical back: Neck supple.  Skin:    Findings: Rash present.           Assessment & Plan:  Venous stasis dermatitis I believe the patient either has Schamberg's disease or mild venous stasis dermatitis versus atopic dermatitis.  He can use triamcinolone  cream 2-3 times daily as needed for itching.  Recommended compression hose for the varicose veins.

## 2023-09-21 ENCOUNTER — Other Ambulatory Visit: Payer: Self-pay

## 2023-10-13 ENCOUNTER — Other Ambulatory Visit: Payer: Self-pay | Admitting: Family Medicine

## 2023-10-13 DIAGNOSIS — E7800 Pure hypercholesterolemia, unspecified: Secondary | ICD-10-CM

## 2023-10-16 ENCOUNTER — Other Ambulatory Visit: Payer: Self-pay

## 2023-10-16 ENCOUNTER — Other Ambulatory Visit (HOSPITAL_COMMUNITY): Payer: Self-pay

## 2023-10-16 MED ORDER — EZETIMIBE 10 MG PO TABS
10.0000 mg | ORAL_TABLET | Freq: Every day | ORAL | 2 refills | Status: AC
  Filled 2023-10-16: qty 90, 90d supply, fill #0
  Filled 2023-10-21 – 2024-01-10 (×2): qty 90, 90d supply, fill #1
  Filled 2024-04-06 (×2): qty 90, 90d supply, fill #2

## 2023-10-21 ENCOUNTER — Encounter: Payer: Self-pay | Admitting: Family Medicine

## 2023-10-22 ENCOUNTER — Other Ambulatory Visit (HOSPITAL_COMMUNITY): Payer: Self-pay

## 2023-10-22 ENCOUNTER — Other Ambulatory Visit: Payer: Self-pay

## 2023-11-25 ENCOUNTER — Encounter: Payer: Self-pay | Admitting: Family Medicine

## 2023-11-26 ENCOUNTER — Encounter: Payer: Self-pay | Admitting: Family Medicine

## 2023-11-26 ENCOUNTER — Other Ambulatory Visit: Payer: Self-pay | Admitting: Family Medicine

## 2023-11-26 ENCOUNTER — Other Ambulatory Visit (HOSPITAL_COMMUNITY): Payer: Self-pay

## 2023-11-26 MED ORDER — CELECOXIB 200 MG PO CAPS
200.0000 mg | ORAL_CAPSULE | Freq: Two times a day (BID) | ORAL | 3 refills | Status: DC
  Filled 2023-11-26 – 2024-03-21 (×4): qty 180, 90d supply, fill #0

## 2023-12-02 ENCOUNTER — Other Ambulatory Visit: Payer: Self-pay

## 2023-12-02 MED ORDER — TIRZEPATIDE-WEIGHT MANAGEMENT 2.5 MG/0.5ML ~~LOC~~ SOLN: 2.5000 mg | SUBCUTANEOUS | 1 refills | Status: DC

## 2023-12-23 ENCOUNTER — Other Ambulatory Visit (HOSPITAL_COMMUNITY): Payer: Self-pay

## 2024-01-07 ENCOUNTER — Ambulatory Visit: Payer: Self-pay

## 2024-01-07 NOTE — Telephone Encounter (Signed)
 FYI Only or Action Required?: FYI only for provider.  Patient was last seen in primary care on 09/16/2023 by Duanne Butler DASEN, MD.  Called Nurse Triage reporting Abdominal Pain.  Symptoms began today.  Interventions attempted: Nothing.  Symptoms are: gradually worsening.  Triage Disposition: Go to ED Now (Notify PCP)  Patient/caregiver understands and will follow disposition?: Yes  Pt had called in earlier for pain, called back stating pain has gotten worse and he thinks something may have ruptured.  Instructed ED   Copied from CRM [id number]. Topic: Clinical - Red Word Triage >> Jan 07, 2024  3:36 PM DeAngela L wrote: Red Word that prompted transfer to Nurse Triage: patient is having stomach pain as if something might have ruptured  Pt num [phone] Reason for Disposition  [1] SEVERE pain AND [2] age > 60 years  [1] SEVERE pain (e.g., excruciating) AND [2] present > 1 hour  Answer Assessment - Initial Assessment Questions 1. LOCATION: Where does it hurt?      Right side of lower abd right above feels like a bulge  2. RADIATION: Does the pain shoot anywhere else? (e.g., chest, back)     no 3. ONSET: When did the pain begin? (Minutes, hours or days ago)      today 4. SUDDEN: Gradual or sudden onset?     sudden 5. PATTERN Does the pain come and go, or is it constant?     constant 6. SEVERITY: How bad is the pain?  (e.g., Scale 1-10; mild, moderate, or severe)     7/10 7. RECURRENT SYMPTOM: Have you ever had this type of stomach pain before? If Yes, ask: When was the last time? and What happened that time?      A week ago  8. CAUSE: What do you think is causing the stomach pain? (e.g., gallstones, recent abdominal surgery)     unknown 9. RELIEVING/AGGRAVATING FACTORS: What makes it better or worse? (e.g., antacids, bending or twisting motion, bowel movement)     no 10. OTHER SYMPTOMS: Do you have any other symptoms? (e.g., back pain, diarrhea,  fever, urination pain, vomiting)       no  Protocols used: Abdominal Pain - Male-A-AH, Abdominal Pain - Upper-A-AH

## 2024-01-08 ENCOUNTER — Other Ambulatory Visit: Payer: Self-pay | Admitting: Family Medicine

## 2024-01-09 NOTE — Telephone Encounter (Signed)
 Requested medication (s) are due for refill today - provider review    Requested medication (s) are on the active medication list -yes  Future visit scheduled -yes  Last refill: 12/02/23 2ml 1RF  Notes to clinic: off protocol- provider review   Requested Prescriptions  Pending Prescriptions Disp Refills   ZEPBOUND  2.5 MG/0.5ML injection vial [Pharmacy Med Name: ZEPBOUND  2.5 MG/0.5ML SUBCUTANEOUS SOLUTION] 2 mL 1    Sig: INJECT 0.5 ML (2.5 MG) UNDER THE SKIN ONCE WEEKLY (0.5ML= 50 UNITS)     Off-Protocol Failed - 01/09/2024  2:31 PM      Failed - Medication not assigned to a protocol, review manually.      Passed - Valid encounter within last 12 months    Recent Outpatient Visits           3 months ago Venous stasis dermatitis   [place] [facility] Pickard, Butler DASEN, MD   5 months ago General medical exam   [place] [facility] Duanne Butler DASEN, MD   5 months ago Dacryocystitis of right lacrimal sac   [place] [facility] Duanne, Butler DASEN, MD   1 year ago General medical exam   [place] [facility] Duanne Butler DASEN, MD   1 year ago Encounter for immunization   [place] [facility] Duanne Butler DASEN, MD                 Requested Prescriptions  Pending Prescriptions Disp Refills   ZEPBOUND  2.5 MG/0.5ML injection vial [Pharmacy Med Name: ZEPBOUND  2.5 MG/0.5ML SUBCUTANEOUS SOLUTION] 2 mL 1    Sig: INJECT 0.5 ML (2.5 MG) UNDER THE SKIN ONCE WEEKLY (0.5ML= 50 UNITS)     Off-Protocol Failed - 01/09/2024  2:31 PM      Failed - Medication not assigned to a protocol, review manually.      Passed - Valid encounter within last 12 months    Recent Outpatient Visits           3 months ago Venous stasis dermatitis   [place] [facility] Pickard, Butler DASEN, MD   5 months ago General medical exam   [place] [facility] Duanne Butler DASEN,  MD   5 months ago Dacryocystitis of right lacrimal sac   [place] [facility] Duanne Butler DASEN, MD   1 year ago General medical exam   [place] [facility] Duanne Butler DASEN, MD   1 year ago Encounter for immunization   [place] [facility] Pickard, Butler DASEN, MD

## 2024-01-10 ENCOUNTER — Ambulatory Visit: Admitting: Family Medicine

## 2024-01-10 ENCOUNTER — Other Ambulatory Visit (HOSPITAL_COMMUNITY): Payer: Self-pay

## 2024-01-10 ENCOUNTER — Encounter: Payer: Self-pay | Admitting: Family Medicine

## 2024-01-10 VITALS — BP 132/76 | HR 77 | Temp 98.1°F | Ht 73.0 in | Wt 218.8 lb

## 2024-01-10 DIAGNOSIS — R1011 Right upper quadrant pain: Secondary | ICD-10-CM | POA: Diagnosis not present

## 2024-01-10 DIAGNOSIS — R1010 Upper abdominal pain, unspecified: Secondary | ICD-10-CM

## 2024-01-10 NOTE — Progress Notes (Signed)
 Subjective:    [name], male    DOB: [birth year], 63 y.o.   MRN: [id number]  Patient states that 3 weeks ago, they were building a retaining wall when he developed pain in the midline of his abdomen just above and slightly to the right of his umbilicus.  The pain was intense.  He had to go home.  He laid down with a heating pad and the pain subsided.  He was fine thereafter.  He then developed pain approximately 1 week ago.  The pain was again just to the right of the center of his abdomen next to his umbilicus.  He states that he felt like he could feel a bulge in the shape of a hotdog.  It was extremely tender to the touch in that area.  He denies any fevers or chills or nausea or vomiting.  He denies any pain in his right upper quadrant.  This occurred a third time on Tuesday and resolved spontaneously.  Therefore the patient has been having intermittent midline to right sided abdominal pain.  It is higher than the appendix.  It is lower and more midline than the gallbladder.  It seems to come and go with no specific trigger however he states it hurts worse when he bends over and with movement.  Past Medical History:  Diagnosis Date   Arthritis    bilateral,hands,hips   Ascending aortic aneurysm (HCC)    Hx of adenomatous polyp of colon 07/08/2014   Hyperlipidemia    Low back pain    Past Surgical History:  Procedure Laterality Date   COLONOSCOPY  09/17/2003   normal dr Vicci   HERNIA REPAIR     1971   POLYPECTOMY     Current Outpatient Medications on File Prior to Visit  Medication Sig Dispense Refill   aspirin 81 MG tablet Take 81 mg by mouth daily.     calcium  carbonate (CALCIUM  600) 600 MG TABS tablet Take 1 tablet (600 mg total) by mouth 2 (two) times daily with a meal. 30 tablet    celecoxib  (CELEBREX ) 200 MG capsule Take 1 capsule (200 mg total) by mouth 2 (two) times daily. 180 capsule 3   Cholecalciferol (VITAMIN D3) 50 MCG (2000 UT) capsule Take 1  capsule (2,000 Units total) by mouth daily.     ezetimibe  (ZETIA ) 10 MG tablet Take 1 tablet (10 mg total) by mouth daily. 90 tablet 2   methocarbamol  (ROBAXIN -750) 750 MG tablet Take 1 tablet (750 mg total) by mouth 4 (four) times daily. 120 tablet 2   Multiple Vitamin (MULTIVITAMIN) tablet Take 1 tablet by mouth daily.     Omega-3 Fatty Acids (FISH OIL CONCENTRATE PO) Take by mouth.     orlistat  (XENICAL ) 120 MG capsule Take 1 capsule (120 mg total) by mouth 3 (three) times daily with meals. 90 capsule 3   Red Yeast Rice Extract 600 MG CAPS red yeast rice  1,200 mg twice a day     tirzepatide  (ZEPBOUND ) 2.5 MG/0.5ML injection vial Inject 2.5 mg into the skin once a week. 2 mL 1   triamcinolone  cream (KENALOG ) 0.1 % Apply 1 Application topically 2 (two) times daily. 30 g 11   No current facility-administered medications on file prior to visit.   Allergies  Allergen Reactions   Niaspan [Niacin Er (Antihyperlipidemic)]     Severe flushing   Social History   Socioeconomic History   Marital status: Married    Spouse name: Not  on file   Number of children: Not on file   Years of education: Not on file   Highest education level: 12th grade  Occupational History   Not on file  Tobacco Use   Smoking status: Never    Passive exposure: Never   Smokeless tobacco: Never  Vaping Use   Vaping status: Never Used  Substance and Sexual Activity   Alcohol use: No    Alcohol/week: 0.0 standard drinks of alcohol   Drug use: No   Sexual activity: Yes    Comment: married.  Works in Data processing manager, Set designer  Other Topics Concern   Not on file  Social History Narrative   Not on file   Social Drivers of Health   Financial Resource Strain: Low Risk  (01/09/2024)   Overall Financial Resource Strain (CARDIA)    Difficulty of Paying Living Expenses: Not hard at all  Food Insecurity: No Food Insecurity (01/09/2024)   Hunger Vital Sign    Worried About Running Out of Food in the Last Year: Never  true    Ran Out of Food in the Last Year: Never true  Transportation Needs: No Transportation Needs (01/09/2024)   PRAPARE - Administrator, Civil Service (Medical): No    Lack of Transportation (Non-Medical): No  Physical Activity: Unknown (01/09/2024)   Exercise Vital Sign    Days of Exercise per Week: 6 days    Minutes of Exercise per Session: Not on file  Stress: Stress Concern Present (01/09/2024)   [employer] of Occupational Health - Occupational Stress Questionnaire    Feeling of Stress: Rather much  Social Connections: Socially Integrated (01/09/2024)   Social Connection and Isolation Panel    Frequency of Communication with Friends and Family: More than three times a week    Frequency of Social Gatherings with Friends and Family: More than three times a week    Attends Religious Services: More than 4 times per year    Active Member of [employer] or Organizations: Yes    Attends Engineer, structural: More than 4 times per year    Marital Status: Married  Catering manager Violence: Not on file      Review of Systems  All other systems reviewed and are negative.      Objective:   Physical Exam Vitals reviewed.  HENT:     Head: Normocephalic and atraumatic.     Mouth/Throat:     Mouth: No oral lesions.     Pharynx: No pharyngeal swelling.     Tonsils: No tonsillar exudate or tonsillar abscesses.  Eyes:     Conjunctiva/sclera:     Right eye: No exudate or hemorrhage.    Left eye: No exudate or hemorrhage. Cardiovascular:     Rate and Rhythm: Normal rate and regular rhythm.     Heart sounds: Normal heart sounds.  Pulmonary:     Effort: Pulmonary effort is normal.     Breath sounds: Normal breath sounds.  Abdominal:     General: Bowel sounds are normal.     Palpations: Abdomen is soft. There is no fluid wave, hepatomegaly, splenomegaly or mass.     Tenderness: There is no abdominal tenderness. Negative signs include Murphy's sign and McBurney's  sign.     Hernia: No hernia is present. There is no hernia in the umbilical area, ventral area, left inguinal area, right femoral area, left femoral area or right inguinal area.   Musculoskeletal:     Cervical back: Neck supple.  Assessment & Plan:  Pain of upper abdomen Based on his history, I feel the patient may have torn abdominal muscle or developed a ventral hernia.  However I am unable to elicit the pain on exam today or palpate a hernia.  The episodic nature raises concern about gallstones however the pain seems to be more midline than I would anticipate with a gallstone and also seems to be worse with movement.  My differential diagnosis is essentially ventral hernia versus abdominal muscle tear gallstones.  Appendicitis is unlikely given the location.  Proceed with a CT scan of the abdomen and pelvis to determine if there is a small hernia that I cannot detect today on exam

## 2024-01-13 ENCOUNTER — Other Ambulatory Visit

## 2024-01-13 ENCOUNTER — Ambulatory Visit
Admission: RE | Admit: 2024-01-13 | Discharge: 2024-01-13 | Disposition: A | Discharge status: Home / Self Care | Source: Ambulatory Visit | Attending: Family Medicine | Admitting: Family Medicine

## 2024-01-13 ENCOUNTER — Ambulatory Visit: Admitting: Family Medicine

## 2024-01-13 DIAGNOSIS — K4091 Unilateral inguinal hernia, without obstruction or gangrene, recurrent: Secondary | ICD-10-CM | POA: Diagnosis not present

## 2024-01-13 DIAGNOSIS — R1011 Right upper quadrant pain: Secondary | ICD-10-CM

## 2024-01-13 MED ORDER — IOPAMIDOL (ISOVUE-300) INJECTION 61%
100.0000 mL | Freq: Once | INTRAVENOUS | Status: AC | PRN
  Administered 2024-01-13: 100 mL via INTRAVENOUS

## 2024-01-15 ENCOUNTER — Other Ambulatory Visit: Payer: Self-pay | Admitting: Family Medicine

## 2024-01-16 ENCOUNTER — Other Ambulatory Visit (INDEPENDENT_AMBULATORY_CARE_PROVIDER_SITE_OTHER): Payer: Self-pay

## 2024-01-16 ENCOUNTER — Ambulatory Visit (INDEPENDENT_AMBULATORY_CARE_PROVIDER_SITE_OTHER): Admitting: Orthopaedic Surgery

## 2024-01-16 ENCOUNTER — Ambulatory Visit: Payer: Self-pay | Admitting: Family Medicine

## 2024-01-16 DIAGNOSIS — M1611 Unilateral primary osteoarthritis, right hip: Secondary | ICD-10-CM | POA: Diagnosis not present

## 2024-01-16 NOTE — Progress Notes (Signed)
 Office Visit Note   Patient: Mike Perez           Date of Birth: [birth year]           MRN: [id number] Visit Date: 01/16/2024              Requested by: Duanne Butler DASEN, MD [id number] [facility] [address] [place],  [place] [address] PCP: Duanne Butler DASEN, MD   Assessment & Plan: Visit Diagnoses:  1. Primary osteoarthritis of right hip     Plan: History of Present Illness Mike Perez is a 63 year old male with severe right hip arthritis who presents with worsening hip pain and functional limitations.  Severe arthritis in the right hip was noted on a CT scan of the abdomen and pelvis. He experiences worsening pain that affects daily activities and quality of life. Celebrex  is taken twice daily for pain management, with significant discomfort upon missing doses. 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 A worsening limp occurs as the day progresses, especially when walking his dog.  Hip pain significantly impacts his ability to perform activities such as walking and working on trucks. Central serous retinopathy precludes the use of steroid treatments, including cortisone injections, for hip pain.  Physical Exam MUSCULOSKELETAL: Limited rotation in right hip. Pain with rotation of hip. Antalgic gait.  Results RADIOLOGY Abdomen and pelvis CT: Severe osteoarthritis in right hip  Assessment and Plan Severe right hip osteoarthritis Severe osteoarthritis confirmed by CT and xrays, causing significant pain and mobility issues. Celebrex  provides partial relief. Cortisone injections contraindicated due to central serous retinopathy. - Recommend hip replacement surgery to improve pain, range of motion, and function. Discussed risks, recovery time 6-8 weeks, and activity restrictions. Surgery scheduling flexible. - Obtain baseline x-rays for surgical planning. - Initiate insurance authorization for surgery. - Obtain medical  clearance from Dr. Duanne. - Provided hip replacement surgery handout. - Coordinate with surgery scheduler for date, possibly pre-Thanksgiving. - Discussed potential for same-day discharge.  Patient has attempted conservative treatment for at least 6 consecutive weeks within the past 12 weeks, including but not limited to physical therapy, home exercise program, NSAIDs, activity modification, and/or corticosteroid injections. Despite these efforts, symptoms have not improved or have worsened. Conservative measures have been deemed unsuccessful at this time. After a detailed discussion covering diagnosis and treatment options--including the risks, benefits, alternatives, and potential complications of surgical and nonsurgical management--the patient elected to proceed with surgery.  Follow-Up Instructions: No follow-ups on file.   Orders:  Orders Placed This Encounter  Procedures   XR HIP UNILAT W OR W/O PELVIS 2-3 VIEWS RIGHT   No orders of the defined types were placed in this encounter.     Procedures: No procedures performed   Clinical Data: No additional findings.   Subjective: Chief Complaint  Patient presents with   Right Hip - Pain    HPI  Review of Systems  Constitutional: Negative.   HENT: Negative.    Eyes: Negative.   Respiratory: Negative.    Cardiovascular: Negative.   Gastrointestinal: Negative.   Endocrine: Negative.   Genitourinary: Negative.   Skin: Negative.   Allergic/Immunologic: Negative.   Neurological: Negative.   Hematological: Negative.   Psychiatric/Behavioral: Negative.    All other systems reviewed and are negative.    Objective: Vital Signs: There were no vitals taken for this visit.  Physical Exam Vitals and nursing note reviewed.  Constitutional:  Appearance: He is well-developed.  HENT:     Head: Normocephalic and atraumatic.  Eyes:     Pupils: Pupils are equal, round, and reactive to light.  Pulmonary:     Effort:  Pulmonary effort is normal.  Abdominal:     Palpations: Abdomen is soft.  Musculoskeletal:        General: Normal range of motion.     Cervical back: Neck supple.  Skin:    General: Skin is warm.  Neurological:     Mental Status: He is alert and oriented to person, place, and time.  Psychiatric:        Behavior: Behavior normal.        Thought Content: Thought content normal.        Judgment: Judgment normal.     Ortho Exam  Specialty Comments:  No specialty comments available.  Imaging: XR HIP UNILAT W OR W/O PELVIS 2-3 VIEWS RIGHT Result Date: 01/16/2024 X-rays of the right hip show advanced degenerative joint disease with bone-on-bone joint space narrowing.  Kellgren-Lawrence stage IV.  Cam deformity of the femoral head    PMFS History: Patient Active Problem List   Diagnosis Date Noted   Ascending aortic aneurysm (HCC) 02/26/2022   Conductive hearing loss, bilateral 12/07/2021   Bilateral impacted cerumen 12/07/2021   Low testosterone  in male 06/22/2021   Central serous retinopathy 02/07/2021   Primary osteoarthritis of right hip 10/30/2019   Primary osteoarthritis of left hip 10/30/2019   Tendinosis of rotator cuff 10/30/2019   Tendinopathy of right biceps tendon 10/30/2019   Hx of adenomatous polyp of colon 07/08/2014   Prediabetes 04/07/2013   Low back pain    Past Medical History:  Diagnosis Date   Arthritis    bilateral,hands,hips   Ascending aortic aneurysm (HCC)    Hx of adenomatous polyp of colon 07/08/2014   Hyperlipidemia    Low back pain     Family History  Problem Relation Age of Onset   Diabetes Father    Colon polyps Brother    Alcohol abuse Brother    Hyperlipidemia Brother    Colon cancer Maternal Grandmother    Esophageal cancer Neg Hx    Rectal cancer Neg Hx    Stomach cancer Neg Hx    Crohn's disease Neg Hx     Past Surgical History:  Procedure Laterality Date   COLONOSCOPY  09/17/2003   normal dr Vicci   HERNIA REPAIR      1971   POLYPECTOMY     Social History   Occupational History   Not on file  Tobacco Use   Smoking status: Never    Passive exposure: Never   Smokeless tobacco: Never  Vaping Use   Vaping status: Never Used  Substance and Sexual Activity   Alcohol use: No    Alcohol/week: 0.0 standard drinks of alcohol   Drug use: No   Sexual activity: Yes    Comment: married.  Works in Data processing manager, Set designer

## 2024-01-23 ENCOUNTER — Encounter: Payer: Self-pay | Admitting: Family Medicine

## 2024-01-24 ENCOUNTER — Telehealth: Payer: Self-pay | Admitting: Orthopaedic Surgery

## 2024-01-24 NOTE — Telephone Encounter (Signed)
 Patient is scheduled for right total hip arthroplasty 04-06-24 at [facility].  Patient's wife said she is thinking about outpatient vs overnight observation.  She asked if patient could plan to stay overnight, but if on the day of the surgery he decided to go home -he could -WOULD THIS BE OK?  The case has been entered as overnight obs (in 3-C).    She would feel better if she could talk to the doctor or the physician assistant about this arrangement prior to surgery.  Please call Venus at [phone].

## 2024-01-24 NOTE — Telephone Encounter (Signed)
 Yes he can go home same day if he feels well

## 2024-02-10 ENCOUNTER — Encounter: Payer: Self-pay | Admitting: Family Medicine

## 2024-02-11 ENCOUNTER — Encounter: Payer: Self-pay | Admitting: Orthopaedic Surgery

## 2024-02-12 ENCOUNTER — Ambulatory Visit (INDEPENDENT_AMBULATORY_CARE_PROVIDER_SITE_OTHER)

## 2024-02-12 DIAGNOSIS — Z23 Encounter for immunization: Secondary | ICD-10-CM | POA: Diagnosis not present

## 2024-02-12 NOTE — Addendum Note (Signed)
 Addended by: ERIKA ELIDA RAMAN on: 02/12/2024 03:58 PM   Modules accepted: Orders

## 2024-02-12 NOTE — Progress Notes (Signed)
 Patient is in office today for a nurse visit for Flu shot Immunization. Patient Injection was given in the  Left deltoid. Patient tolerated injection well.  Elida GORMAN Manila, CMA

## 2024-02-13 ENCOUNTER — Other Ambulatory Visit: Payer: Self-pay | Admitting: Family Medicine

## 2024-02-13 ENCOUNTER — Other Ambulatory Visit (HOSPITAL_COMMUNITY): Payer: Self-pay

## 2024-02-13 ENCOUNTER — Encounter: Payer: Self-pay | Admitting: Family Medicine

## 2024-02-13 ENCOUNTER — Other Ambulatory Visit: Payer: Self-pay

## 2024-02-13 ENCOUNTER — Encounter (HOSPITAL_COMMUNITY): Payer: Self-pay

## 2024-02-13 ENCOUNTER — Ambulatory Visit: Admitting: Family Medicine

## 2024-02-13 VITALS — BP 120/78 | HR 86 | Temp 98.2°F | Ht 73.0 in | Wt 216.0 lb

## 2024-02-13 DIAGNOSIS — F321 Major depressive disorder, single episode, moderate: Secondary | ICD-10-CM | POA: Diagnosis not present

## 2024-02-13 DIAGNOSIS — K469 Unspecified abdominal hernia without obstruction or gangrene: Secondary | ICD-10-CM

## 2024-02-13 MED ORDER — ESCITALOPRAM OXALATE 10 MG PO TABS
10.0000 mg | ORAL_TABLET | Freq: Every day | ORAL | 5 refills | Status: DC
  Filled 2024-02-13: qty 30, 30d supply, fill #0
  Filled 2024-03-07 – 2024-03-09 (×2): qty 30, 30d supply, fill #1

## 2024-02-13 MED ORDER — ALPRAZOLAM 0.5 MG PO TABS
0.5000 mg | ORAL_TABLET | Freq: Three times a day (TID) | ORAL | 0 refills | Status: AC | PRN
  Filled 2024-02-13: qty 30, 10d supply, fill #0

## 2024-02-13 NOTE — Progress Notes (Signed)
 Subjective:    [name], male    DOB: Jun 11, 1960, 63 y.o.   MRN: [id number]  Anxiety     Patient is a very pleasant 63 year old Caucasian gentleman who presents today requesting assistance stress.  His mother and his father are in poor health.  This is causing tremendous stress as he is trying to care for them.  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  He reports feeling anxious all the time.  He reports sadness.  He reports anhedonia.  He reports anxiety attacks.  He reports trouble sleeping.  He states that he feels hopeless for the future.  He denies any suicidal thoughts. Past Medical History:  Diagnosis Date   Arthritis    bilateral,hands,hips   Ascending aortic aneurysm    Hx of adenomatous polyp of colon 07/08/2014   Hyperlipidemia    Low back pain    Past Surgical History:  Procedure Laterality Date   COLONOSCOPY  09/17/2003   normal dr Vicci   HERNIA REPAIR     1971   POLYPECTOMY     Current Outpatient Medications on File Prior to Visit  Medication Sig Dispense Refill   aspirin 81 MG tablet Take 81 mg by mouth daily.     calcium  carbonate (CALCIUM  600) 600 MG TABS tablet Take 1 tablet (600 mg total) by mouth 2 (two) times daily with a meal. 30 tablet    celecoxib  (CELEBREX ) 200 MG capsule Take 1 capsule (200 mg total) by mouth 2 (two) times daily. 180 capsule 3   Cholecalciferol (VITAMIN D3) 50 MCG (2000 UT) capsule Take 1 capsule (2,000 Units total) by mouth daily.     ezetimibe  (ZETIA ) 10 MG tablet Take 1 tablet (10 mg total) by mouth daily. 90 tablet 2   methocarbamol  (ROBAXIN -750) 750 MG tablet Take 1 tablet (750 mg total) by mouth 4 (four) times daily. 120 tablet 2   Multiple Vitamin (MULTIVITAMIN) tablet Take 1 tablet by mouth daily.     Omega-3 Fatty Acids (FISH OIL CONCENTRATE PO) Take by mouth.     orlistat  (XENICAL ) 120 MG  capsule Take 1 capsule (120 mg total) by mouth 3 (three) times daily with meals. 90 capsule 3   triamcinolone  cream (KENALOG ) 0.1 % Apply 1 Application topically 2 (two) times daily. 30 g 11   ZEPBOUND  2.5 MG/0.5ML injection vial INJECT 0.5 ML (2.5 MG) UNDER THE SKIN ONCE WEEKLY (0.5ML= 50 UNITS) 2 mL 1   No current facility-administered medications on file prior to visit.   Allergies  Allergen Reactions   Niaspan [Niacin Er (Antihyperlipidemic)]     Severe flushing   Social History   Socioeconomic History   Marital status: Married    Spouse name: Not on file   Number of children: Not on file   Years of education: Not on file   Highest education level: 12th grade  Occupational History   Not on file  Tobacco Use   Smoking status: Never    Passive exposure: Never   Smokeless tobacco: Never  Vaping Use   Vaping status: Never Used  Substance and Sexual Activity   Alcohol use: No    Alcohol/week: 0.0 standard drinks of alcohol   Drug use: No   Sexual activity: Yes    Comment: married.  Works in Data processing manager, Set designer  Other Topics Concern  Not on file  Social History Narrative   Not on file   Social Drivers of Health   Financial Resource Strain: Low Risk  (01/09/2024)   Overall Financial Resource Strain (CARDIA)    Difficulty of Paying Living Expenses: Not hard at all  Food Insecurity: No Food Insecurity (01/09/2024)   Hunger Vital Sign    Worried About Running Out of Food in the Last Year: Never true    Ran Out of Food in the Last Year: Never true  Transportation Needs: No Transportation Needs (01/09/2024)   PRAPARE - Administrator, Civil Service (Medical): No    Lack of Transportation (Non-Medical): No  Physical Activity: Unknown (01/09/2024)   Exercise Vital Sign    Days of Exercise per Week: 6 days    Minutes of Exercise per Session: Not on file  Stress: Stress Concern Present (01/09/2024)   [employer] of Occupational Health - Occupational  Stress Questionnaire    Feeling of Stress: Rather much  Social Connections: Socially Integrated (01/09/2024)   Social Connection and Isolation Panel    Frequency of Communication with Friends and Family: More than three times a week    Frequency of Social Gatherings with Friends and Family: More than three times a week    Attends Religious Services: More than 4 times per year    Active Member of [employer] or Organizations: Yes    Attends Engineer, structural: More than 4 times per year    Marital Status: Married  Catering manager Violence: Not on file      Review of Systems  All other systems reviewed and are negative.      Objective:   Physical Exam Constitutional:      Appearance: Normal appearance. He is normal weight.  Cardiovascular:     Rate and Rhythm: Normal rate and regular rhythm.     Heart sounds: Normal heart sounds.  Pulmonary:     Effort: Pulmonary effort is normal.     Breath sounds: Normal breath sounds.  Neurological:     Mental Status: He is alert.  Psychiatric:        Mood and Affect: Mood normal.        Behavior: Behavior normal.        Thought Content: Thought content normal.        Judgment: Judgment normal.           Assessment & Plan:   Depression, major, single episode, moderate (HCC) Symptoms been present for more than 6 months.  Has gradually been worsening.  I believe he has developed clinical depression.  Recommended trying Lexapro  10 mg a day.  Reassess in 4 to 6 weeks.  Use Xanax  0.5 mg every 8 hours as needed sparingly for anxiety.

## 2024-02-25 ENCOUNTER — Encounter: Payer: Self-pay | Admitting: Family Medicine

## 2024-02-25 ENCOUNTER — Encounter (HOSPITAL_COMMUNITY): Payer: Self-pay

## 2024-02-25 ENCOUNTER — Other Ambulatory Visit (HOSPITAL_COMMUNITY): Payer: Self-pay

## 2024-02-25 ENCOUNTER — Encounter: Payer: Self-pay | Admitting: Orthopaedic Surgery

## 2024-03-01 NOTE — Telephone Encounter (Signed)
See message.  Thanks.

## 2024-03-02 ENCOUNTER — Other Ambulatory Visit: Payer: Self-pay

## 2024-03-02 DIAGNOSIS — M1611 Unilateral primary osteoarthritis, right hip: Secondary | ICD-10-CM

## 2024-03-07 ENCOUNTER — Encounter (HOSPITAL_COMMUNITY): Payer: Self-pay

## 2024-03-07 ENCOUNTER — Other Ambulatory Visit (HOSPITAL_COMMUNITY): Payer: Self-pay

## 2024-03-09 ENCOUNTER — Other Ambulatory Visit (HOSPITAL_COMMUNITY): Payer: Self-pay

## 2024-03-17 ENCOUNTER — Other Ambulatory Visit: Payer: Self-pay | Admitting: Physician Assistant

## 2024-03-17 ENCOUNTER — Other Ambulatory Visit (HOSPITAL_COMMUNITY): Payer: Self-pay

## 2024-03-17 MED ORDER — ASPIRIN 81 MG PO CHEW
81.0000 mg | CHEWABLE_TABLET | Freq: Two times a day (BID) | ORAL | 0 refills | Status: AC
  Filled 2024-03-17 – 2024-03-18 (×2): qty 84, 42d supply, fill #0

## 2024-03-17 MED ORDER — ONDANSETRON HCL 4 MG PO TABS
4.0000 mg | ORAL_TABLET | Freq: Three times a day (TID) | ORAL | 0 refills | Status: AC | PRN
  Filled 2024-03-17 – 2024-03-18 (×2): qty 40, 14d supply, fill #0

## 2024-03-17 MED ORDER — OXYCODONE-ACETAMINOPHEN 5-325 MG PO TABS
1.0000 | ORAL_TABLET | Freq: Four times a day (QID) | ORAL | 0 refills | Status: AC | PRN
  Filled 2024-03-17 – 2024-03-18 (×2): qty 40, 5d supply, fill #0

## 2024-03-17 MED ORDER — METHOCARBAMOL 750 MG PO TABS
750.0000 mg | ORAL_TABLET | Freq: Three times a day (TID) | ORAL | 2 refills | Status: AC | PRN
Start: 2024-03-17 — End: ?
  Filled 2024-03-17 – 2024-03-18 (×2): qty 30, 10d supply, fill #0

## 2024-03-17 MED ORDER — DOCUSATE SODIUM 100 MG PO CAPS
100.0000 mg | ORAL_CAPSULE | Freq: Every day | ORAL | 2 refills | Status: AC | PRN
  Filled 2024-03-17 – 2024-03-18 (×2): qty 30, 30d supply, fill #0

## 2024-03-18 ENCOUNTER — Encounter (HOSPITAL_COMMUNITY): Payer: Self-pay

## 2024-03-18 ENCOUNTER — Other Ambulatory Visit (HOSPITAL_COMMUNITY): Payer: Self-pay

## 2024-03-18 DIAGNOSIS — H5203 Hypermetropia, bilateral: Secondary | ICD-10-CM | POA: Diagnosis not present

## 2024-03-20 ENCOUNTER — Other Ambulatory Visit (HOSPITAL_COMMUNITY): Payer: Self-pay

## 2024-03-20 ENCOUNTER — Other Ambulatory Visit: Payer: Self-pay | Admitting: Family Medicine

## 2024-03-20 ENCOUNTER — Other Ambulatory Visit: Payer: Self-pay

## 2024-03-20 MED ORDER — ESCITALOPRAM OXALATE 10 MG PO TABS
10.0000 mg | ORAL_TABLET | Freq: Every day | ORAL | 1 refills | Status: AC
  Filled 2024-03-20 – 2024-04-06 (×3): qty 90, 90d supply, fill #0

## 2024-03-23 ENCOUNTER — Encounter: Payer: Self-pay | Admitting: Radiology

## 2024-03-23 ENCOUNTER — Other Ambulatory Visit (HOSPITAL_COMMUNITY): Payer: Self-pay

## 2024-03-26 NOTE — Progress Notes (Signed)
 Surgical Instructions   Your procedure is scheduled on April 06, 2024. Report to [facility] Main Entrance A at 5:30 A.M., then check in with the Admitting office. Any questions or running late day of surgery: call [phone]  Questions prior to your surgery date: call [phone], Monday-Friday, 8am-4pm. If you experience any cold or flu symptoms such as cough, fever, chills, shortness of breath, etc. between now and your scheduled surgery, please notify us  at the above number.     Remember:  Do not eat after midnight the night before your surgery  You may drink clear liquids until 4:30 the morning of your surgery.   Clear liquids allowed are: Water, Non-Citrus Juices (without pulp), Carbonated Beverages, Clear Tea (no milk, honey, etc.), Black Coffee Only (NO MILK, CREAM OR POWDERED CREAMER of any kind), and Gatorade.    Take these medicines the morning of surgery with A SIP OF WATER  escitalopram  (LEXAPRO )  ezetimibe  (ZETIA )  orlistat  (XENICAL )   May take these medicines IF NEEDED: ALPRAZolam  (XANAX )  ondansetron (ZOFRAN)  oxyCODONE-acetaminophen  (PERCOCET   Aspirin follow instructions given to your by your surgeon, if no instructions reach out to office for further instructions.  ZEPBOUND  LAST DOSE   One week prior to surgery, STOP taking any  (unless otherwise instructed by your surgeon) Aleve, Naproxen, Ibuprofen, Motrin, Advil, Goody's, BC's, all herbal medications, fish oil, and non-prescription vitamins. THIS INCLUDES YOUR celecoxib  (CELEBREX ) and Omega-3 Fatty Acids (FISH OIL                      Do NOT Smoke (Tobacco/Vaping) for 24 hours prior to your procedure.  If you use a CPAP at night, you may bring your mask/headgear for your overnight stay.   You will be asked to remove any contacts, glasses, piercing's, hearing aid's, dentures/partials prior to surgery. Please bring cases for these items if needed.    Patients discharged the day of surgery will not  be allowed to drive home, and someone needs to stay with them for 24 hours.  SURGICAL WAITING ROOM VISITATION Patients may have no more than 2 support people in the waiting area - these visitors may rotate.   Pre-op nurse will coordinate an appropriate time for 1 ADULT support person, who may not rotate, to accompany patient in pre-op.  Children under the age of 62 must have an adult with them who is not the patient and must remain in the main waiting area with an adult.  If the patient needs to stay at the hospital during part of their recovery, the visitor guidelines for inpatient rooms apply.  Please refer to the [facility] website for the visitor guidelines for any additional information.   If you received a COVID test during your pre-op visit  it is requested that you wear a mask when out in public, stay away from anyone that may not be feeling well and notify your surgeon if you develop symptoms. If you have been in contact with anyone that has tested positive in the last 10 days please notify you surgeon.      Pre-operative 4 CHG Bathing Instructions   You can play a key role in reducing the risk of infection after surgery. Your skin needs to be as free of germs as possible. You can reduce the number of germs on your skin by washing with CHG (chlorhexidine  gluconate) soap before surgery. CHG is an antiseptic soap that kills germs and continues to kill germs even after  washing.   DO NOT use if you have an allergy to chlorhexidine /CHG or antibacterial soaps. If your skin becomes reddened or irritated, stop using the CHG and notify one of our RNs at [phone].   Please shower with the CHG soap starting 4 days before surgery using the following schedule:     Please keep in mind the following:  DO NOT shave, including legs and underarms, starting the day of your first shower.   You may shave your face at any point before/day of surgery.  Place clean sheets on your bed the day you  start using CHG soap. Use a clean washcloth (not used since being washed) for each shower. DO NOT sleep with pets once you start using the CHG.   CHG Shower Instructions:  Wash your face and private area with normal soap. If you choose to wash your hair, wash first with your normal shampoo.  After you use shampoo/soap, rinse your hair and body thoroughly to remove shampoo/soap residue.  Turn the water OFF and apply  bottle of CHG soap to a CLEAN washcloth.  Apply CHG soap ONLY FROM YOUR NECK DOWN TO YOUR TOES (washing for 3-5 minutes)  DO NOT use CHG soap on face, private areas, open wounds, or sores.  Pay special attention to the area where your surgery is being performed.  If you are having back surgery, having someone wash your back for you may be helpful. Wait 2 minutes after CHG soap is applied, then you may rinse off the CHG soap.  Pat dry with a clean towel  Put on clean clothes/pajamas   If you choose to wear lotion, please use ONLY the CHG-compatible lotions that are listed below.  Additional instructions for the day of surgery:  If you choose, you may shower the morning of surgery with an antibacterial soap.  DO NOT APPLY any lotions, deodorants, cologne, or perfumes.   Do not bring valuables to the hospital. [facility] is not responsible for any belongings/valuables. Do not wear nail polish, gel polish, artificial nails, or any other type of covering on natural nails (fingers and toes) Do not wear jewelry or makeup Put on clean/comfortable clothes.  Please brush your teeth.  Ask your nurse before applying any prescription medications to the skin.     CHG Compatible Lotions   Aveeno Moisturizing lotion  Cetaphil Moisturizing Cream  Cetaphil Moisturizing Lotion  Clairol Herbal Essence Moisturizing Lotion, Dry Skin  Clairol Herbal Essence Moisturizing Lotion, Extra Dry Skin  Clairol Herbal Essence Moisturizing Lotion, Normal Skin  Curel Age Defying Therapeutic  Moisturizing Lotion with Alpha Hydroxy  Curel Extreme Care Body Lotion  Curel Soothing Hands Moisturizing Hand Lotion  Curel Therapeutic Moisturizing Cream, Fragrance-Free  Curel Therapeutic Moisturizing Lotion, Fragrance-Free  Curel Therapeutic Moisturizing Lotion, Original Formula  Eucerin Daily Replenishing Lotion  Eucerin Dry Skin Therapy Plus Alpha Hydroxy Crme  Eucerin Dry Skin Therapy Plus Alpha Hydroxy Lotion  Eucerin Original Crme  Eucerin Original Lotion  Eucerin Plus Crme Eucerin Plus Lotion  Eucerin TriLipid Replenishing Lotion  Keri Anti-Bacterial Hand Lotion  Keri Deep Conditioning Original Lotion Dry Skin Formula Softly Scented  Keri Deep Conditioning Original Lotion, Fragrance Free Sensitive Skin Formula  Keri Lotion Fast Absorbing Fragrance Free Sensitive Skin Formula  Keri Lotion Fast Absorbing Softly Scented Dry Skin Formula  Keri Original Lotion  Keri Skin Renewal Lotion Keri Silky Smooth Lotion  Keri Silky Smooth Sensitive Skin Lotion  Nivea Body Creamy Conditioning Oil  Nivea Body Extra Enriched  Lotion  Retail Buyer Moisturizing Lotion Nivea Crme  Nivea Skin Firming Lotion  NutraDerm 30 Skin Lotion  NutraDerm Skin Lotion  NutraDerm Therapeutic Skin Cream  NutraDerm Therapeutic Skin Lotion  ProShield Protective Hand Cream  Provon moisturizing lotion  Please read over the following fact sheets that you were given.

## 2024-03-27 ENCOUNTER — Encounter (HOSPITAL_COMMUNITY)
Admission: RE | Admit: 2024-03-27 | Discharge: 2024-03-27 | Disposition: A | Discharge status: Home / Self Care | Source: Ambulatory Visit | Attending: Orthopaedic Surgery | Admitting: Orthopaedic Surgery

## 2024-03-27 ENCOUNTER — Other Ambulatory Visit: Payer: Self-pay

## 2024-03-27 ENCOUNTER — Encounter (HOSPITAL_COMMUNITY): Payer: Self-pay

## 2024-03-27 VITALS — BP 118/71 | HR 71 | Temp 98.6°F | Resp 17 | Ht 73.0 in | Wt 212.0 lb

## 2024-03-27 DIAGNOSIS — Z01818 Encounter for other preprocedural examination: Secondary | ICD-10-CM | POA: Diagnosis not present

## 2024-03-27 DIAGNOSIS — M1611 Unilateral primary osteoarthritis, right hip: Secondary | ICD-10-CM | POA: Diagnosis not present

## 2024-03-27 DIAGNOSIS — I10 Essential (primary) hypertension: Secondary | ICD-10-CM

## 2024-03-27 DIAGNOSIS — E785 Hyperlipidemia, unspecified: Secondary | ICD-10-CM | POA: Diagnosis not present

## 2024-03-27 DIAGNOSIS — I7121 Aneurysm of the ascending aorta, without rupture: Secondary | ICD-10-CM | POA: Diagnosis not present

## 2024-03-27 LAB — CBC
HCT: 40.2 % (ref 39.0–52.0)
Hemoglobin: 13.1 g/dL (ref 13.0–17.0)
MCH: 30.2 pg (ref 26.0–34.0)
MCHC: 32.6 g/dL (ref 30.0–36.0)
MCV: 92.6 fL (ref 80.0–100.0)
Platelets: 191 K/uL (ref 150–400)
RBC: 4.34 MIL/uL (ref 4.22–5.81)
RDW: 13.7 % (ref 11.5–15.5)
WBC: 6.4 K/uL (ref 4.0–10.5)
nRBC: 0 % (ref 0.0–0.2)

## 2024-03-27 LAB — BASIC METABOLIC PANEL WITH GFR
Anion gap: 9 (ref 5–15)
BUN: 17 mg/dL (ref 8–23)
CO2: 26 mmol/L (ref 22–32)
Calcium: 8.8 mg/dL — ABNORMAL LOW (ref 8.9–10.3)
Chloride: 103 mmol/L (ref 98–111)
Creatinine, Ser: 0.67 mg/dL (ref 0.61–1.24)
GFR, Estimated: >60 mL/min (ref >60)
Glucose, Bld: 89 mg/dL (ref 70–99)
Potassium: 4 mmol/L (ref 3.5–5.1)
Sodium: 138 mmol/L (ref 135–145)

## 2024-03-27 LAB — TYPE AND SCREEN
ABO/RH(D): A NEG
Antibody Screen: NEGATIVE

## 2024-03-27 LAB — SURGICAL PCR SCREEN

## 2024-03-27 NOTE — Progress Notes (Signed)
 PCP - Dr. Butler Burr Cardiologist - denies  PPM/ICD - denies   Chest x-ray - 10/14/14 EKG - 03/27/24 Stress Test - denies ECHO - denies Cardiac Cath - denies  Sleep Study - denies   DM- denies  Last dose of GLP1 agonist-  n/a   Blood Thinner Instructions: n/a Aspirin Instructions: f/u with surgeon  ERAS Protcol - clears until 0430 PRE-SURGERY Ensure given  COVID TEST- n/a   Anesthesia review: yes, ascending aortic aneurysm monitored yearly with PCP  Patient denies shortness of breath, fever, cough and chest pain at PAT appointment   All instructions explained to the patient, with a verbal understanding of the material. Patient agrees to go over the instructions while at home for a better understanding.  The opportunity to ask questions was provided.

## 2024-03-27 NOTE — Progress Notes (Signed)
 Surgical Instructions   Your procedure is scheduled on April 06, 2024. Report to [facility] Main Entrance A at 5:30 A.M., then check in with the Admitting office. Any questions or running late day of surgery: call [phone]  Questions prior to your surgery date: call [phone], Monday-Friday, 8am-4pm. If you experience any cold or flu symptoms such as cough, fever, chills, shortness of breath, etc. between now and your scheduled surgery, please notify us  at the above number.     Remember:  Do not eat after midnight the night before your surgery  You may drink clear liquids until 4:30 the morning of your surgery.   Clear liquids allowed are: Water, Non-Citrus Juices (without pulp), Carbonated Beverages, Clear Tea (no milk, honey, etc.), Black Coffee Only (NO MILK, CREAM OR POWDERED CREAMER of any kind), and Gatorade.    Take these medicines the morning of surgery with A SIP OF WATER  escitalopram  (LEXAPRO )  ezetimibe  (ZETIA )    May take these medicines IF NEEDED: ALPRAZolam  (XANAX )  ondansetron (ZOFRAN)  oxyCODONE-acetaminophen  (PERCOCET   Aspirin follow instructions given to your by your surgeon, if no instructions reach out to office for further instructions.  ZEPBOUND  hold 7 days. Last dose on or before 11/9.  One week prior to surgery, STOP taking any Aleve, Naproxen, Ibuprofen, Motrin, Advil, Goody's, BC's, all herbal medications, fish oil, and non-prescription vitamins. THIS INCLUDES YOUR celecoxib  (CELEBREX ) and Omega-3 Fatty Acids (FISH OIL)                      Do NOT Smoke (Tobacco/Vaping) for 24 hours prior to your procedure.  If you use a CPAP at night, you may bring your mask/headgear for your overnight stay.   You will be asked to remove any contacts, glasses, piercing's, hearing aid's, dentures/partials prior to surgery. Please bring cases for these items if needed.    Patients discharged the day of surgery will not be allowed to drive home, and  someone needs to stay with them for 24 hours.  SURGICAL WAITING ROOM VISITATION Patients may have no more than 2 support people in the waiting area - these visitors may rotate.   Pre-op nurse will coordinate an appropriate time for 1 ADULT support person, who may not rotate, to accompany patient in pre-op.  Children under the age of 34 must have an adult with them who is not the patient and must remain in the main waiting area with an adult.  If the patient needs to stay at the hospital during part of their recovery, the visitor guidelines for inpatient rooms apply.  Please refer to the [facility] website for the visitor guidelines for any additional information.   If you received a COVID test during your pre-op visit  it is requested that you wear a mask when out in public, stay away from anyone that may not be feeling well and notify your surgeon if you develop symptoms. If you have been in contact with anyone that has tested positive in the last 10 days please notify you surgeon.      Pre-operative 4 CHG Bathing Instructions   You can play a key role in reducing the risk of infection after surgery. Your skin needs to be as free of germs as possible. You can reduce the number of germs on your skin by washing with CHG (chlorhexidine  gluconate) soap before surgery. CHG is an antiseptic soap that kills germs and continues to kill germs even after washing.  DO NOT use if you have an allergy to chlorhexidine /CHG or antibacterial soaps. If your skin becomes reddened or irritated, stop using the CHG and notify one of our RNs at [phone].   Please shower with the CHG soap starting 4 days before surgery using the following schedule:     Please keep in mind the following:  DO NOT shave, including legs and underarms, starting the day of your first shower.   You may shave your face at any point before/day of surgery.  Place clean sheets on your bed the day you start using CHG soap. Use a  clean washcloth (not used since being washed) for each shower. DO NOT sleep with pets once you start using the CHG.   CHG Shower Instructions:  Wash your face and private area with normal soap. If you choose to wash your hair, wash first with your normal shampoo.  After you use shampoo/soap, rinse your hair and body thoroughly to remove shampoo/soap residue.  Turn the water OFF and apply  bottle of CHG soap to a CLEAN washcloth.  Apply CHG soap ONLY FROM YOUR NECK DOWN TO YOUR TOES (washing for 3-5 minutes)  DO NOT use CHG soap on face, private areas, open wounds, or sores.  Pay special attention to the area where your surgery is being performed.  If you are having back surgery, having someone wash your back for you may be helpful. Wait 2 minutes after CHG soap is applied, then you may rinse off the CHG soap.  Pat dry with a clean towel  Put on clean clothes/pajamas   If you choose to wear lotion, please use ONLY the CHG-compatible lotions that are listed below.  Additional instructions for the day of surgery:  If you choose, you may shower the morning of surgery with an antibacterial soap.  DO NOT APPLY any lotions, deodorants, cologne, or perfumes.   Do not bring valuables to the hospital. [facility] is not responsible for any belongings/valuables. Do not wear nail polish, gel polish, artificial nails, or any other type of covering on natural nails (fingers and toes) Do not wear jewelry or makeup Put on clean/comfortable clothes.  Please brush your teeth.  Ask your nurse before applying any prescription medications to the skin.     CHG Compatible Lotions   Aveeno Moisturizing lotion  Cetaphil Moisturizing Cream  Cetaphil Moisturizing Lotion  Clairol Herbal Essence Moisturizing Lotion, Dry Skin  Clairol Herbal Essence Moisturizing Lotion, Extra Dry Skin  Clairol Herbal Essence Moisturizing Lotion, Normal Skin  Curel Age Defying Therapeutic Moisturizing Lotion with Alpha  Hydroxy  Curel Extreme Care Body Lotion  Curel Soothing Hands Moisturizing Hand Lotion  Curel Therapeutic Moisturizing Cream, Fragrance-Free  Curel Therapeutic Moisturizing Lotion, Fragrance-Free  Curel Therapeutic Moisturizing Lotion, Original Formula  Eucerin Daily Replenishing Lotion  Eucerin Dry Skin Therapy Plus Alpha Hydroxy Crme  Eucerin Dry Skin Therapy Plus Alpha Hydroxy Lotion  Eucerin Original Crme  Eucerin Original Lotion  Eucerin Plus Crme Eucerin Plus Lotion  Eucerin TriLipid Replenishing Lotion  Keri Anti-Bacterial Hand Lotion  Keri Deep Conditioning Original Lotion Dry Skin Formula Softly Scented  Keri Deep Conditioning Original Lotion, Fragrance Free Sensitive Skin Formula  Keri Lotion Fast Absorbing Fragrance Free Sensitive Skin Formula  Keri Lotion Fast Absorbing Softly Scented Dry Skin Formula  Keri Original Lotion  Keri Skin Renewal Lotion Keri Silky Smooth Lotion  Keri Silky Smooth Sensitive Skin Lotion  Nivea Body Creamy Conditioning Oil  Nivea Body Extra Enriched Lotion  [place]  Body Original Lotion  Nivea Body Sheer Moisturizing Lotion Nivea Crme  Nivea Skin Firming Lotion  NutraDerm 30 Skin Lotion  NutraDerm Skin Lotion  NutraDerm Therapeutic Skin Cream  NutraDerm Therapeutic Skin Lotion  ProShield Protective Hand Cream  Provon moisturizing lotion  Please read over the following fact sheets that you were given.

## 2024-03-30 ENCOUNTER — Encounter: Payer: Self-pay | Admitting: Family Medicine

## 2024-03-30 ENCOUNTER — Encounter: Payer: Self-pay | Admitting: Orthopaedic Surgery

## 2024-03-30 NOTE — Progress Notes (Signed)
 Anesthesia Chart Review:  Case: [id number] Date/Time: 04/06/24 0700   Procedure: ARTHROPLASTY, HIP, TOTAL, ANTERIOR APPROACH (Right: Hip) - 3-C   Anesthesia type: Spinal   Diagnosis: Primary osteoarthritis of right hip [M16.11]   Pre-op diagnosis: right hip osteoarthritis   Location: [facility] ROOM 09 / [facility] OR   Surgeons: Jerri Kay HERO, MD       DISCUSSION: Patient is a 63 year old male scheduled for the above procedure.  History includes never smoker, HLD, ascending thoracic aortic aneurysm (4 cm 07/15/2023), osteoarthritis.  Preoperative EKG and labs noted.  Anesthesia team to evaluate on the day of surgery.  VS: BP 118/71   Pulse 71   Temp 37 C   Resp 17   Ht 6' 1 (1.854 m)   Wt 96.2 kg   SpO2 96%   BMI 27.97 kg/m   PROVIDERS: Duanne Butler DASEN, MD is PCP. Currently monitoring TAA.    LABS: Labs reviewed: Acceptable for surgery. (all labs ordered are listed, but only abnormal results are displayed)  Labs Reviewed  SURGICAL PCR SCREEN - Abnormal; Notable for the following components:      Result Value   MRSA, PCR   (*)    Value: INVALID, UNABLE TO DETERMINE THE PRESENCE OF TARGET DUE TO SPECIMEN INTEGRITY. RECOLLECTION REQUESTED.   Staphylococcus aureus   (*)    Value: INVALID, UNABLE TO DETERMINE THE PRESENCE OF TARGET DUE TO SPECIMEN INTEGRITY. RECOLLECTION REQUESTED.   All other components within normal limits  BASIC METABOLIC PANEL WITH GFR - Abnormal; Notable for the following components:   Calcium  8.8 (*)    All other components within normal limits  CBC  TYPE AND SCREEN     IMAGES: Xray right hip 01/16/2024: X-rays of the right hip show advanced degenerative joint disease with  bone-on-bone joint space narrowing.  Kellgren-Lawrence stage IV.  Cam  deformity of the femoral head   CT Abd/pelvis 01/13/2024: IMPRESSION: - No acute findings. No evidence of appendicitis or paraumbilical hernia. - Tiny fat-containing left inguinal hernia. - Mildly enlarged  prostate, and probable chronic bladder outlet obstruction. - Severe right hip osteoarthritis.  CTA Chest 07/15/2023: IMPRESSION: *Stable mild aneurysmal dilatation of the ascending aorta with proximal ascending aorta measuring 4 x 4 cm proximally, 3.8 x 4 cm distally. Aortic arch normal. Proximal descending thoracic aorta measures 2.7 x 2.5 cm and distal descending thoracic aorta measures 2.2 x 2.4 cm proximal abdominal aorta unremarkable. No dissection. *No acute cardiopulmonary process.    EKG: 03/27/2024: Sinus rhythm with occasional Premature ventricular complexes Incomplete right bundle branch block Borderline ECG Confirmed by Barbaraann Kotyk [phone]) on 03/27/2024 3:53:04 PM  CV: N/A  Past Medical History:  Diagnosis Date   Arthritis    bilateral,hands,hips   Ascending aortic aneurysm    monitored yearly thru PCP   Hx of adenomatous polyp of colon 07/08/2014   Hyperlipidemia    Low back pain     Past Surgical History:  Procedure Laterality Date   COLONOSCOPY  09/17/2003   normal dr Vicci   HERNIA REPAIR     1971   POLYPECTOMY      MEDICATIONS:  ALPRAZolam  (XANAX ) 0.5 MG tablet   aspirin 81 MG chewable tablet   aspirin 81 MG tablet   calcium  carbonate (CALCIUM  600) 600 MG TABS tablet   celecoxib  (CELEBREX ) 200 MG capsule   Cholecalciferol (VITAMIN D3) 50 MCG (2000 UT) capsule   docusate sodium (COLACE) 100 MG capsule   escitalopram  (LEXAPRO ) 10 MG tablet  ezetimibe  (ZETIA ) 10 MG tablet   methocarbamol  (ROBAXIN ) 750 MG tablet   Multiple Vitamin (MULTIVITAMIN) tablet   Omega-3 Fatty Acids (FISH OIL CONCENTRATE PO)   ondansetron (ZOFRAN) 4 MG tablet   orlistat  (XENICAL ) 120 MG capsule   oxyCODONE-acetaminophen  (PERCOCET) 5-325 MG tablet   ZEPBOUND  2.5 MG/0.5ML injection vial   No current facility-administered medications for this encounter.    Isaiah Ruder, PA-C Surgical Short Stay/Anesthesiology [facility] Phone [phone] [facility] Phone [phone] 03/30/2024 6:40 PM

## 2024-03-30 NOTE — Progress Notes (Signed)
 PCR returns invalid, will need one DOS

## 2024-03-30 NOTE — Anesthesia Preprocedure Evaluation (Addendum)
 Anesthesia Evaluation  Patient identified by MRN, date of birth, ID band Patient awake    Reviewed: Allergy & Precautions, NPO status , Patient's Chart, lab work & pertinent test results  History of Anesthesia Complications Negative for: history of anesthetic complications  Airway Mallampati: II  TM Distance: >3 FB Neck ROM: Full    Dental  (+) Dental Advisory Given, Chipped,    Pulmonary neg pulmonary ROS   Pulmonary exam normal breath sounds clear to auscultation       Cardiovascular (-) hypertension(-) angina (-) CAD (CAC of 1 on 02/13/2022), (-) Past MI, (-) Cardiac Stents and (-) CABG + dysrhythmias (PVCs, iRBBB)  Rhythm:Regular Rate:Normal  HLD, ascending aortic aneurysm   Neuro/Psych neg Seizures  Neuromuscular disease (low back pain)    GI/Hepatic negative GI ROS, Neg liver ROS,,,  Endo/Other  Pre-diabetes  Renal/GU negative Renal ROS     Musculoskeletal  (+) Arthritis , Osteoarthritis,    Abdominal   Peds  Hematology negative hematology ROS (+) Lab Results      Component                Value               Date                      WBC                      6.4                 03/27/2024                HGB                      13.1                03/27/2024                HCT                      40.2                03/27/2024                MCV                      92.6                03/27/2024                PLT                      191                 03/27/2024              Anesthesia Other Findings Last Zepbound : 03/13/2024  Reproductive/Obstetrics                              Anesthesia Physical Anesthesia Plan  ASA: 2  Anesthesia Plan: MAC and Spinal   Post-op Pain Management: Tylenol  PO (pre-op)*   Induction: Intravenous  PONV Risk Score and Plan: 1 and Ondansetron, Dexamethasone , Propofol infusion, Midazolam and Treatment may vary due to age or medical  condition  Airway Management Planned: Natural Airway and Simple Face Mask  Additional Equipment:   Intra-op Plan:   Post-operative Plan: Extubation in OR  Informed Consent: I have reviewed the patients History and Physical, chart, labs and discussed the procedure including the risks, benefits and alternatives for the proposed anesthesia with the patient or authorized representative who has indicated his/her understanding and acceptance.     Dental advisory given  Plan Discussed with: CRNA and Anesthesiologist  Anesthesia Plan Comments: (PAT note written 03/30/2024 by Allison Zelenak, PA-C.  I have discussed risks of neuraxial anesthesia including but not limited to infection, bleeding, nerve injury, back pain, headache, seizures, and failure of block. Patient denies bleeding disorders and is not currently anticoagulated. Labs have been reviewed. Risks and benefits discussed. All patient's questions answered.   Discussed with patient risks of MAC including, but not limited to, minor pain or discomfort, hearing people in the room, and possible need for backup general anesthesia. Risks for general anesthesia also discussed including, but not limited to, sore throat, hoarse voice, chipped/damaged teeth, injury to vocal cords, nausea and vomiting, allergic reactions, lung infection, heart attack, stroke, and death. All questions answered.   )         Anesthesia Quick Evaluation

## 2024-03-31 NOTE — Telephone Encounter (Signed)
 Sheri, would you mind calling him about attending a total joint education class?  Thank you.

## 2024-04-01 NOTE — Telephone Encounter (Signed)
 Thanks

## 2024-04-04 ENCOUNTER — Encounter (HOSPITAL_COMMUNITY): Payer: Self-pay

## 2024-04-06 ENCOUNTER — Other Ambulatory Visit: Payer: Self-pay

## 2024-04-06 ENCOUNTER — Observation Stay (HOSPITAL_COMMUNITY)

## 2024-04-06 ENCOUNTER — Observation Stay (HOSPITAL_COMMUNITY)
Admission: RE | Admit: 2024-04-06 | Discharge: 2024-04-06 | Disposition: A | Discharge status: Home / Self Care | Attending: Orthopaedic Surgery | Admitting: Orthopaedic Surgery

## 2024-04-06 ENCOUNTER — Encounter (HOSPITAL_COMMUNITY)
Admission: RE | Disposition: A | Discharge status: Home / Self Care | Payer: Self-pay | Source: Home / Self Care | Attending: Orthopaedic Surgery

## 2024-04-06 ENCOUNTER — Ambulatory Visit (HOSPITAL_COMMUNITY): Payer: Self-pay | Admitting: Anesthesiology

## 2024-04-06 ENCOUNTER — Other Ambulatory Visit (HOSPITAL_COMMUNITY): Payer: Self-pay

## 2024-04-06 ENCOUNTER — Ambulatory Visit (HOSPITAL_COMMUNITY): Payer: Self-pay | Admitting: Vascular Surgery

## 2024-04-06 ENCOUNTER — Ambulatory Visit (HOSPITAL_COMMUNITY)

## 2024-04-06 ENCOUNTER — Encounter (HOSPITAL_COMMUNITY): Payer: Self-pay | Admitting: Orthopaedic Surgery

## 2024-04-06 DIAGNOSIS — Z96641 Presence of right artificial hip joint: Secondary | ICD-10-CM | POA: Diagnosis not present

## 2024-04-06 DIAGNOSIS — M1611 Unilateral primary osteoarthritis, right hip: Principal | ICD-10-CM | POA: Diagnosis present

## 2024-04-06 HISTORY — PX: TOTAL HIP ARTHROPLASTY: SHX124

## 2024-04-06 LAB — ABO/RH: ABO/RH(D): A NEG

## 2024-04-06 SURGERY — ARTHROPLASTY, HIP, TOTAL, ANTERIOR APPROACH
Anesthesia: Monitor Anesthesia Care | Site: Hip | Laterality: Right

## 2024-04-06 MED ORDER — FENTANYL CITRATE (PF) 100 MCG/2ML IJ SOLN
INTRAMUSCULAR | Status: AC
  Filled 2024-04-06: qty 2

## 2024-04-06 MED ORDER — LIDOCAINE 2% (20 MG/ML) 5 ML SYRINGE
INTRAMUSCULAR | Status: AC
Start: 2024-04-06 — End: 2024-04-06
  Filled 2024-04-06: qty 5

## 2024-04-06 MED ORDER — PHENYLEPHRINE HCL-NACL 20-0.9 MG/250ML-% IV SOLN
INTRAVENOUS | Status: DC | PRN
  Administered 2024-04-06: 40 ug/min via INTRAVENOUS

## 2024-04-06 MED ORDER — SORBITOL 70 % SOLN
30.0000 mL | Freq: Every day | Status: DC | PRN
Start: 2024-04-06 — End: 2024-04-07

## 2024-04-06 MED ORDER — ORAL CARE MOUTH RINSE: 15.0000 mL | Freq: Once | OROMUCOSAL | Status: AC

## 2024-04-06 MED ORDER — LACTATED RINGERS IV SOLN: INTRAVENOUS | Status: DC

## 2024-04-06 MED ORDER — ONDANSETRON HCL 4 MG/2ML IJ SOLN
INTRAMUSCULAR | Status: AC
  Filled 2024-04-06: qty 2

## 2024-04-06 MED ORDER — VANCOMYCIN HCL 1 G IV SOLR
INTRAVENOUS | Status: DC | PRN
  Administered 2024-04-06: 1000 mg via TOPICAL

## 2024-04-06 MED ORDER — METOCLOPRAMIDE HCL 5 MG PO TABS: 5.0000 mg | ORAL_TABLET | Freq: Three times a day (TID) | ORAL | Status: DC | PRN

## 2024-04-06 MED ORDER — PRONTOSAN WOUND IRRIGATION OPTIME
TOPICAL | Status: DC | PRN
  Administered 2024-04-06: 1 via TOPICAL

## 2024-04-06 MED ORDER — CEFAZOLIN SODIUM-DEXTROSE 2-4 GM/100ML-% IV SOLN
2.0000 g | Freq: Four times a day (QID) | INTRAVENOUS | Status: AC
  Administered 2024-04-06 (×2): 2 g via INTRAVENOUS
  Filled 2024-04-06 (×2): qty 100

## 2024-04-06 MED ORDER — POVIDONE-IODINE 10 % EX SWAB
2.0000 | Freq: Once | CUTANEOUS | Status: AC
  Administered 2024-04-06: 2 via TOPICAL

## 2024-04-06 MED ORDER — ASPIRIN 81 MG PO CHEW: 81.0000 mg | CHEWABLE_TABLET | Freq: Two times a day (BID) | ORAL | Status: DC

## 2024-04-06 MED ORDER — FENTANYL CITRATE (PF) 250 MCG/5ML IJ SOLN
INTRAMUSCULAR | Status: DC | PRN
  Administered 2024-04-06: 50 ug via INTRAVENOUS

## 2024-04-06 MED ORDER — SODIUM CHLORIDE 0.9 % IR SOLN
Status: DC | PRN
  Administered 2024-04-06: 1000 mL

## 2024-04-06 MED ORDER — 0.9 % SODIUM CHLORIDE (POUR BTL) OPTIME
TOPICAL | Status: DC | PRN
  Administered 2024-04-06: 1000 mL

## 2024-04-06 MED ORDER — CHLORHEXIDINE GLUCONATE 0.12 % MT SOLN: 15.0000 mL | Freq: Once | OROMUCOSAL | Status: AC

## 2024-04-06 MED ORDER — ACETAMINOPHEN 325 MG PO TABS: 325.0000 mg | ORAL_TABLET | Freq: Four times a day (QID) | ORAL | Status: DC | PRN

## 2024-04-06 MED ORDER — POLYETHYLENE GLYCOL 3350 17 G PO PACK
17.0000 g | PACK | Freq: Every day | ORAL | Status: DC
  Administered 2024-04-06: 17 g via ORAL
  Filled 2024-04-06: qty 1

## 2024-04-06 MED ORDER — HYDROCODONE-ACETAMINOPHEN 5-325 MG PO TABS
1.0000 | ORAL_TABLET | Freq: Four times a day (QID) | ORAL | Status: DC | PRN
  Administered 2024-04-06: 1 via ORAL
  Filled 2024-04-06: qty 1

## 2024-04-06 MED ORDER — MIDAZOLAM HCL 2 MG/2ML IJ SOLN
INTRAMUSCULAR | Status: AC
  Filled 2024-04-06: qty 2

## 2024-04-06 MED ORDER — BUPIVACAINE HCL (PF) 0.5 % IJ SOLN
INTRAMUSCULAR | Status: DC | PRN
  Administered 2024-04-06: 12 mg via INTRATHECAL

## 2024-04-06 MED ORDER — METHOCARBAMOL 500 MG PO TABS
500.0000 mg | ORAL_TABLET | Freq: Four times a day (QID) | ORAL | Status: DC | PRN
  Administered 2024-04-06: 500 mg via ORAL
  Filled 2024-04-06: qty 1

## 2024-04-06 MED ORDER — BUPIVACAINE-MELOXICAM ER 400-12 MG/14ML IJ SOLN
INTRAMUSCULAR | Status: AC
Start: 2024-04-06 — End: 2024-04-06
  Filled 2024-04-06: qty 1

## 2024-04-06 MED ORDER — BUPIVACAINE HCL (PF) 0.5 % IJ SOLN: INTRAMUSCULAR | Status: DC | PRN

## 2024-04-06 MED ORDER — ONDANSETRON HCL 4 MG/2ML IJ SOLN: 4.0000 mg | Freq: Four times a day (QID) | INTRAMUSCULAR | Status: DC | PRN

## 2024-04-06 MED ORDER — EPHEDRINE SULFATE-NACL 50-0.9 MG/10ML-% IV SOSY
PREFILLED_SYRINGE | INTRAVENOUS | Status: DC | PRN
  Administered 2024-04-06 (×2): 5 mg via INTRAVENOUS

## 2024-04-06 MED ORDER — ACETAMINOPHEN 500 MG PO TABS
1000.0000 mg | ORAL_TABLET | Freq: Four times a day (QID) | ORAL | Status: DC
  Administered 2024-04-06 (×2): 1000 mg via ORAL
  Filled 2024-04-06 (×2): qty 2

## 2024-04-06 MED ORDER — LIDOCAINE 2% (20 MG/ML) 5 ML SYRINGE
INTRAMUSCULAR | Status: DC | PRN
Start: 2024-04-06 — End: 2024-04-06
  Administered 2024-04-06: 40 mg via INTRAVENOUS

## 2024-04-06 MED ORDER — PANTOPRAZOLE SODIUM 40 MG PO TBEC
40.0000 mg | DELAYED_RELEASE_TABLET | Freq: Every day | ORAL | Status: DC
  Administered 2024-04-06: 40 mg via ORAL
  Filled 2024-04-06: qty 1

## 2024-04-06 MED ORDER — METOCLOPRAMIDE HCL 5 MG/ML IJ SOLN: 5.0000 mg | Freq: Three times a day (TID) | INTRAMUSCULAR | Status: DC | PRN

## 2024-04-06 MED ORDER — TRANEXAMIC ACID-NACL 1000-0.7 MG/100ML-% IV SOLN
1000.0000 mg | Freq: Once | INTRAVENOUS | Status: AC
  Administered 2024-04-06: 1000 mg via INTRAVENOUS
  Filled 2024-04-06: qty 100

## 2024-04-06 MED ORDER — LACTATED RINGERS IV BOLUS
250.0000 mL | Freq: Once | INTRAVENOUS | Status: AC
  Administered 2024-04-06: 250 mL via INTRAVENOUS

## 2024-04-06 MED ORDER — ONDANSETRON HCL 4 MG/2ML IJ SOLN
INTRAMUSCULAR | Status: DC | PRN
Start: 2024-04-06 — End: 2024-04-06
  Administered 2024-04-06: 4 mg via INTRAVENOUS

## 2024-04-06 MED ORDER — TRANEXAMIC ACID-NACL 1000-0.7 MG/100ML-% IV SOLN
1000.0000 mg | INTRAVENOUS | Status: AC
  Administered 2024-04-06: 1000 mg via INTRAVENOUS
  Filled 2024-04-06: qty 100

## 2024-04-06 MED ORDER — MORPHINE SULFATE (PF) 2 MG/ML IV SOLN: 0.5000 mg | Freq: Four times a day (QID) | INTRAVENOUS | Status: DC | PRN

## 2024-04-06 MED ORDER — BUPIVACAINE HCL (PF) 0.5 % IJ SOLN
INTRAMUSCULAR | Status: AC
  Filled 2024-04-06: qty 10

## 2024-04-06 MED ORDER — DEXAMETHASONE SOD PHOSPHATE PF 10 MG/ML IJ SOLN
INTRAMUSCULAR | Status: DC | PRN
  Administered 2024-04-06: 10 mg via INTRAVENOUS

## 2024-04-06 MED ORDER — DOCUSATE SODIUM 100 MG PO CAPS
100.0000 mg | ORAL_CAPSULE | Freq: Two times a day (BID) | ORAL | Status: DC
  Administered 2024-04-06: 100 mg via ORAL
  Filled 2024-04-06: qty 1

## 2024-04-06 MED ORDER — MIDAZOLAM HCL (PF) 2 MG/2ML IJ SOLN
INTRAMUSCULAR | Status: DC | PRN
  Administered 2024-04-06: 2 mg via INTRAVENOUS

## 2024-04-06 MED ORDER — TRANEXAMIC ACID 1000 MG/10ML IV SOLN
2000.0000 mg | INTRAVENOUS | Status: DC
  Filled 2024-04-06: qty 20

## 2024-04-06 MED ORDER — AMISULPRIDE (ANTIEMETIC) 5 MG/2ML IV SOLN: 10.0000 mg | Freq: Once | INTRAVENOUS | Status: DC | PRN

## 2024-04-06 MED ORDER — EPHEDRINE 5 MG/ML INJ
INTRAVENOUS | Status: AC
  Filled 2024-04-06: qty 5

## 2024-04-06 MED ORDER — PROPOFOL 500 MG/50ML IV EMUL
INTRAVENOUS | Status: DC | PRN
  Administered 2024-04-06: 10 mg via INTRAVENOUS
  Administered 2024-04-06: 100 ug/kg/min via INTRAVENOUS

## 2024-04-06 MED ORDER — DIPHENHYDRAMINE HCL 12.5 MG/5ML PO ELIX: 25.0000 mg | ORAL_SOLUTION | ORAL | Status: DC | PRN

## 2024-04-06 MED ORDER — VANCOMYCIN HCL 1000 MG IV SOLR
INTRAVENOUS | Status: AC
Start: 2024-04-06 — End: 2024-04-06
  Filled 2024-04-06: qty 20

## 2024-04-06 MED ORDER — METHOCARBAMOL 1000 MG/10ML IJ SOLN: 500.0000 mg | Freq: Four times a day (QID) | INTRAMUSCULAR | Status: DC | PRN

## 2024-04-06 MED ORDER — PHENOL 1.4 % MT LIQD: 1.0000 | OROMUCOSAL | Status: DC | PRN

## 2024-04-06 MED ORDER — ALUM & MAG HYDROXIDE-SIMETH 200-200-20 MG/5ML PO SUSP: 30.0000 mL | ORAL | Status: DC | PRN

## 2024-04-06 MED ORDER — CEFAZOLIN SODIUM-DEXTROSE 2-4 GM/100ML-% IV SOLN
2.0000 g | INTRAVENOUS | Status: AC
  Administered 2024-04-06: 2 g via INTRAVENOUS
  Filled 2024-04-06: qty 100

## 2024-04-06 MED ORDER — LACTATED RINGERS IV BOLUS: 500.0000 mL | Freq: Once | INTRAVENOUS | Status: DC

## 2024-04-06 MED ORDER — PROPOFOL 1000 MG/100ML IV EMUL
INTRAVENOUS | Status: AC
  Filled 2024-04-06: qty 100

## 2024-04-06 MED ORDER — CHLORHEXIDINE GLUCONATE 0.12 % MT SOLN
OROMUCOSAL | Status: AC
  Administered 2024-04-06: 15 mL via OROMUCOSAL
  Filled 2024-04-06: qty 15

## 2024-04-06 MED ORDER — PHENYLEPHRINE 80 MCG/ML (10ML) SYRINGE FOR IV PUSH (FOR BLOOD PRESSURE SUPPORT)
PREFILLED_SYRINGE | INTRAVENOUS | Status: DC | PRN
Start: 2024-04-06 — End: 2024-04-06
  Administered 2024-04-06 (×3): 80 ug via INTRAVENOUS

## 2024-04-06 MED ORDER — ACETAMINOPHEN 500 MG PO TABS
ORAL_TABLET | ORAL | Status: AC
  Administered 2024-04-06: 1000 mg via ORAL
  Filled 2024-04-06: qty 2

## 2024-04-06 MED ORDER — DEXAMETHASONE SOD PHOSPHATE PF 10 MG/ML IJ SOLN: 10.0000 mg | Freq: Once | INTRAMUSCULAR | Status: DC

## 2024-04-06 MED ORDER — HYDROMORPHONE HCL 1 MG/ML IJ SOLN: 0.2500 mg | INTRAMUSCULAR | Status: DC | PRN

## 2024-04-06 MED ORDER — SODIUM CHLORIDE 0.9 % IV SOLN: INTRAVENOUS | Status: DC

## 2024-04-06 MED ORDER — OXYCODONE HCL 5 MG PO TABS: 5.0000 mg | ORAL_TABLET | Freq: Once | ORAL | Status: DC | PRN

## 2024-04-06 MED ORDER — ACETAMINOPHEN 500 MG PO TABS: 1000.0000 mg | ORAL_TABLET | Freq: Once | ORAL | Status: AC

## 2024-04-06 MED ORDER — MAGNESIUM CITRATE PO SOLN: 1.0000 | Freq: Once | ORAL | Status: DC | PRN

## 2024-04-06 MED ORDER — ONDANSETRON HCL 4 MG PO TABS: 4.0000 mg | ORAL_TABLET | Freq: Four times a day (QID) | ORAL | Status: DC | PRN

## 2024-04-06 MED ORDER — OXYCODONE HCL 5 MG/5ML PO SOLN: 5.0000 mg | Freq: Once | ORAL | Status: DC | PRN

## 2024-04-06 MED ORDER — TRANEXAMIC ACID 1000 MG/10ML IV SOLN
INTRAVENOUS | Status: DC | PRN
  Administered 2024-04-06: 2000 mg via TOPICAL

## 2024-04-06 MED ORDER — HYDROCODONE-ACETAMINOPHEN 7.5-325 MG PO TABS
1.0000 | ORAL_TABLET | Freq: Four times a day (QID) | ORAL | Status: DC | PRN
  Administered 2024-04-06: 1 via ORAL
  Filled 2024-04-06: qty 2

## 2024-04-06 MED ORDER — MENTHOL 3 MG MT LOZG: 1.0000 | LOZENGE | OROMUCOSAL | Status: DC | PRN

## 2024-04-06 SURGICAL SUPPLY — 50 items
BAG COUNTER SPONGE SURGICOUNT (BAG) ×1 IMPLANT
BAG DECANTER FOR FLEXI CONT (MISCELLANEOUS) ×1 IMPLANT
BLADE SAG 18X100X1.27 (BLADE) ×1 IMPLANT
CLSR STERI-STRIP ANTIMIC 1/2X4 (GAUZE/BANDAGES/DRESSINGS) IMPLANT
COVER PERINEAL POST (MISCELLANEOUS) ×1 IMPLANT
COVER SURGICAL LIGHT HANDLE (MISCELLANEOUS) ×1 IMPLANT
CUP ACETAB W/GRIPTION 54 (Plate) IMPLANT
DERMABOND ADVANCED .7 DNX12 (GAUZE/BANDAGES/DRESSINGS) IMPLANT
DRAPE C-ARM 42X72 X-RAY (DRAPES) ×1 IMPLANT
DRAPE POUCH INSTRU U-SHP 10X18 (DRAPES) ×1 IMPLANT
DRAPE STERI IOBAN 125X83 (DRAPES) ×1 IMPLANT
DRAPE U-SHAPE 47X51 STRL (DRAPES) ×2 IMPLANT
DRSG AQUACEL AG ADV 3.5X 6 (GAUZE/BANDAGES/DRESSINGS) IMPLANT
DRSG AQUACEL AG ADV 3.5X10 (GAUZE/BANDAGES/DRESSINGS) ×1 IMPLANT
DURAPREP 26ML APPLICATOR (WOUND CARE) ×2 IMPLANT
ELECTRODE BLDE 4.0 EZ CLN MEGD (MISCELLANEOUS) ×1 IMPLANT
ELECTRODE REM PT RTRN 9FT ADLT (ELECTROSURGICAL) ×1 IMPLANT
GLOVE BIOGEL PI IND STRL 7.0 (GLOVE) ×2 IMPLANT
GLOVE BIOGEL PI IND STRL 7.5 (GLOVE) ×1 IMPLANT
GLOVE ECLIPSE 7.0 STRL STRAW (GLOVE) ×5 IMPLANT
GLOVE INDICATOR 7.0 STRL GRN (GLOVE) ×1 IMPLANT
GLOVE SURG SYN 7.5 PF PI (GLOVE) ×5 IMPLANT
GOWN STRL REUS W/ TWL LRG LVL3 (GOWN DISPOSABLE) IMPLANT
GOWN STRL REUS W/ TWL XL LVL3 (GOWN DISPOSABLE) ×1 IMPLANT
GOWN STRL SURGICAL XL XLNG (GOWN DISPOSABLE) ×1 IMPLANT
GOWN TOGA ZIPPER T7+ PEEL AWAY (MISCELLANEOUS) ×1 IMPLANT
HEAD CERAMIC DELTA 36 PLUS 1.5 (Hips) IMPLANT
HOOD PEEL AWAY T7 (MISCELLANEOUS) ×1 IMPLANT
IV 0.9% NACL 1000 ML (IV SOLUTION) ×1 IMPLANT
KIT BASIN OR (CUSTOM PROCEDURE TRAY) ×1 IMPLANT
LINER NEUTRAL 54X36MM PLUS 4 (Hips) IMPLANT
MARKER SKIN DUAL TIP RULER LAB (MISCELLANEOUS) ×1 IMPLANT
NDL SPNL 18GX3.5 QUINCKE PK (NEEDLE) ×1 IMPLANT
NEEDLE SPNL 18GX3.5 QUINCKE PK (NEEDLE) ×1 IMPLANT
PACK TOTAL JOINT (CUSTOM PROCEDURE TRAY) ×1 IMPLANT
PACK UNIVERSAL I (CUSTOM PROCEDURE TRAY) ×1 IMPLANT
SCREW 6.5MMX25MM (Screw) IMPLANT
SET HNDPC FAN SPRY TIP SCT (DISPOSABLE) ×1 IMPLANT
SOLUTION PRONTOSAN WOUND 350ML (IRRIGATION / IRRIGATOR) ×1 IMPLANT
STEM FEMORAL SZ5 HIGH ACTIS (Stem) IMPLANT
SUT ETHIBOND 2 V 37 (SUTURE) ×1 IMPLANT
SUT STRATAFIX PDS+ 0 24IN (SUTURE) IMPLANT
SUT VIC AB 0 CT1 27XBRD ANBCTR (SUTURE) ×1 IMPLANT
SUT VIC AB 1 CTX36XBRD ANBCTR (SUTURE) ×1 IMPLANT
SUT VIC AB 2-0 CT1 TAPERPNT 27 (SUTURE) ×2 IMPLANT
SYR 30ML LL (SYRINGE) ×2 IMPLANT
TOWEL GREEN STERILE (TOWEL DISPOSABLE) ×1 IMPLANT
TRAY FOLEY W/BAG SLVR 16FR ST (SET/KITS/TRAYS/PACK) IMPLANT
TUBE SUCT ARGYLE STRL (TUBING) ×1 IMPLANT
YANKAUER SUCT BULB TIP NO VENT (SUCTIONS) ×1 IMPLANT

## 2024-04-06 NOTE — Anesthesia Procedure Notes (Signed)
 Spinal  Patient location during procedure: OR Start time: 04/06/2024 7:15 AM End time: 04/06/2024 7:19 AM Reason for block: surgical anesthesia Staffing Performed: anesthesiologist  Anesthesiologist: Peggye Delon Brunswick, MD Performed by: Peggye Delon Brunswick, MD Authorized by: Peggye Delon Brunswick, MD   Preanesthetic Checklist Completed: patient identified, IV checked, site marked, risks and benefits discussed, surgical consent, monitors and equipment checked, pre-op evaluation and timeout performed Spinal Block Patient position: sitting Prep: DuraPrep Patient monitoring: blood pressure and continuous pulse ox Approach: midline Location: L3-4 Injection technique: single-shot Needle Needle type: Pencan  Needle gauge: 24 G Needle length: 9 cm Additional Notes Risks and benefits of neuraxial anesthesia including, but not limited to, infection, bleeding, local anesthetic toxicity, headache, hypotension, back pain, block failure, etc. were discussed with the patient. The patient expressed understanding and consented to the procedure. I confirmed that the patient has no bleeding disorders and is not taking blood thinners. I confirmed the patient's last platelet count with the nurse. Monitors were applied. A time-out was performed immediately prior to the procedure. Sterile technique was used throughout the whole procedure.   1 attempt(s)

## 2024-04-06 NOTE — Transfer of Care (Signed)
 Immediate Anesthesia Transfer of Care Note  Patient: Mike Perez  Procedure(s) Performed: ARTHROPLASTY, HIP, TOTAL, ANTERIOR APPROACH (Right: Hip)  Patient Location: PACU  Anesthesia Type:MAC and Spinal  Level of Consciousness: awake and alert   Airway & Oxygen Therapy: Patient Spontanous Breathing and Patient connected to nasal cannula oxygen  Post-op Assessment: Report given to RN and Post -op Vital signs reviewed and stable  Post vital signs: Reviewed and stable  Last Vitals:  Vitals Value Taken Time  BP 98/57   Temp 97.7   Pulse 75   Resp 15   SpO2 98     Last Pain:  Vitals:   04/06/24 0645  TempSrc:   PainSc: 0-No pain         Complications: No notable events documented.

## 2024-04-06 NOTE — Progress Notes (Signed)
 Per ortho office pt has be pre-scheduled for outpatient therapy. Orders in for [facility] but pt will not need HH since will be attending outpt rehab.

## 2024-04-06 NOTE — Op Note (Addendum)
 ARTHROPLASTY, HIP, TOTAL, ANTERIOR APPROACH  Procedure Note Mike Perez   [id number]  Pre-op Diagnosis: right hip osteoarthritis     Post-op Diagnosis: same  Operative Findings Severe OA Successful correction of leg length discrepancy   Operative Procedures  1. Total hip replacement; Right hip; uncemented [id number]   Surgeon: Kay Cummins, M.D.  Assist: Ronal Morna Grave, PA-C   Anesthesia: spinal  Prosthesis: Depuy Acetabulum: Pinnacle 54 mm Femur: Actis 5 HO Head: 36 mm size: +1.5 Liner: +4 neutral Bearing Type: ceramic/poly  Total Hip Arthroplasty (Anterior Approach) Op Note:  After informed consent was obtained and the operative extremity marked in the holding area, the patient was brought back to the operating room and placed supine on the HANA table. Next, the operative extremity was prepped and draped in normal sterile fashion. Surgical timeout occurred verifying patient identification, surgical site, surgical procedure and administration of antibiotics.  A 10 cm longitudinal incision was made starting from 2 fingerbreadths lateral and inferior to the ASIS towards the lateral aspect of the patella.  A Hueter approach to the hip was performed, using the interval between tensor fascia lata and sartorius.  Dissection was carried bluntly down onto the anterior hip capsule. The lateral femoral circumflex vessels were identified and coagulated. A capsulotomy was performed and the capsular flaps tagged for later repair.  The neck osteotomy was performed 1 fingerbreadth above the lesser trochanter. The femoral head was removed which showed severe disease, the acetabular rim was cleared of soft tissue and osteophytes and attention was turned to reaming the acetabulum.  Sequential reaming was performed under fluoroscopic guidance down to the floor of the cotyloid fossa. We reamed to a size 53 mm, and then impacted the acetabular shell. A 25 mm cancellous screw was placed to  augment fixation.  A +4 liner was then placed after irrigation and attention turned to the femur.  After placing the femoral hook, the leg was taken to externally rotated, extended and adducted position taking care to perform soft tissue releases to allow for adequate mobilization of the femur. Soft tissue was cleared from the shoulder of the greater trochanter and the hook elevator used to improve exposure of the proximal femur.  Lateral bone from the shoulder was rasped away for relief.  Sequential broaching performed up to a size 5.  Standard trial neck and +1.5 head were placed. The leg was brought back up to neutral and the construct reduced.  The position and sizing of components, offset and leg lengths were checked using fluoroscopy.  Based on fluoroscopic findings, we chose to retrial with high offset neck and +1.5 head ball which better restored offset without overlengthening the leg.  Stability of the construct was checked in 45 degrees of hip extension and 90 degrees of external rotation without any subluxation, shuck or impingement of prosthesis. We dislocated the prosthesis, dropped the leg back into position, removed trial components, and irrigated copiously. The final stem and head were chosen then placed, the leg brought back up, the system reduced and fluoroscopy used to verify positioning.  Antibiotic irrigation was placed in the surgical wound.   We irrigated, obtained hemostasis and closed the capsule using #2 ethibond suture.  A topical mixture of 0.25% bupivacaine and meloxicam  was placed deep to the fascia.  One gram of vancomycin powder was placed in the surgical bed.   One gram of topical tranexamic acid was injected into the joint.  The fascia was closed with #1 stratafix, the deep fat  layer was closed with 0 vicryl, the subcutaneous layers closed with 2.0 Vicryl Plus and the skin closed with 3.0 monocryl and steri strips. A sterile dressing was applied. The patient was awakened in the  operating room and taken to recovery in stable condition.  All sponge, needle, and instrument counts were correct at the end of the case.   Morna Grave, my PA, was a medical necessity for opening, closing, limb positioning, retracting, exposing, and overall facilitation and timely completion of the surgery.  Position: supine  Complications: see description of procedure.  Time Out: performed   Drains/Packing: none  Estimated blood loss: see anesthesia record  Returned to Recovery Room: in good condition.   Antibiotics: yes   Mechanical VTE (DVT) Prophylaxis: sequential compression devices, TED thigh-high  Chemical VTE (DVT) Prophylaxis: aspirin   Fluid Replacement: see anesthesia record  Specimens Removed: 1 to pathology   Sponge and Instrument Count Correct? yes   PACU: portable radiograph - low AP   Plan/RTC: Return in 2 weeks for suture removal. Weight Bearing/Load Lower Extremity: full  Hip precautions: none Suture Removal: 2 weeks   N. Ozell Cummins, MD [facility] 8:29 AM   Implant Name Type Inv. Item Serial No. Manufacturer Lot No. LRB No. Used Action  SCREW 6.5MMX25MM - [id number] Screw SCREW 6.5MMX25MM  [employer] [id number] Right 1 Implanted  LINER NEUTRAL 54X36MM PLUS 4 - [id number] Hips LINER NEUTRAL 54X36MM PLUS 4  [employer] [id number] Right 1 Implanted  CUP ACETAB W/GRIPTION 54 - [id number] Plate CUP ACETAB W/GRIPTION 54  [employer] [id number] Right 1 Implanted  HEAD CERAMIC DELTA 36 PLUS 1.5 - [id number] Hips HEAD CERAMIC DELTA 36 PLUS 1.5  [employer] [id number] Right 1 Implanted  STEM FEMORAL SZ5 HIGH ACTIS - [id number] Stem STEM FEMORAL SZ5 HIGH ACTIS  [employer] [id number] Right 1 Implanted

## 2024-04-06 NOTE — Addendum Note (Signed)
 Addendum  created 04/06/24 1222 by Atanacio Arland HERO, CRNA   Flowsheet accepted

## 2024-04-06 NOTE — Anesthesia Postprocedure Evaluation (Signed)
 Anesthesia Post Note  Patient: SEBASTIEN JACKSON  Procedure(s) Performed: ARTHROPLASTY, HIP, TOTAL, ANTERIOR APPROACH (Right: Hip)     Patient location during evaluation: PACU Anesthesia Type: MAC and Spinal Level of consciousness: awake Pain management: pain level controlled Vital Signs Assessment: post-procedure vital signs reviewed and stable Respiratory status: spontaneous breathing, respiratory function stable and nonlabored ventilation Cardiovascular status: blood pressure returned to baseline and stable Postop Assessment: no headache, no backache and no apparent nausea or vomiting Anesthetic complications: no   No notable events documented.  Last Vitals:  Vitals:   04/06/24 1045 04/06/24 1100  BP: 108/70 109/72  Pulse: 64 70  Resp: 13 20  Temp: 36.6 C   SpO2: 96% 97%    Last Pain:  Vitals:   04/06/24 1045  TempSrc:   PainSc: 0-No pain                 Delon Aisha Arch

## 2024-04-06 NOTE — H&P (Signed)
 PREOPERATIVE H&P  Chief Complaint: right hip osteoarthritis  HPI: Mike Perez is a 63 y.o. male who presents for surgical treatment of right hip osteoarthritis.  He denies any changes in medical history.  Past Surgical History:  Procedure Laterality Date  . COLONOSCOPY  09/17/2003   normal dr Vicci  . HERNIA REPAIR     1971  . POLYPECTOMY     Social History   Socioeconomic History  . Marital status: Married    Spouse name: Not on file  . Number of children: 1  . Years of education: Not on file  . Highest education level: 12th grade  Occupational History  . Not on file  Tobacco Use  . Smoking status: Never    Passive exposure: Never  . Smokeless tobacco: Never  Vaping Use  . Vaping status: Never Used  Substance and Sexual Activity  . Alcohol use: No    Alcohol/week: 0.0 standard drinks of alcohol  . Drug use: No  . Sexual activity: Yes    Comment: married.  Works in data processing manager, set designer  Other Topics Concern  . Not on file  Social History Narrative  . Not on file   Social Drivers of Health   Financial Resource Strain: Low Risk  (01/09/2024)   Overall Financial Resource Strain (CARDIA)   . Difficulty of Paying Living Expenses: Not hard at all  Food Insecurity: No Food Insecurity (01/09/2024)   Hunger Vital Sign   . Worried About Programme Researcher, Broadcasting/film/video in the Last Year: Never true   . Ran Out of Food in the Last Year: Never true  Transportation Needs: No Transportation Needs (01/09/2024)   PRAPARE - Transportation   . Lack of Transportation (Medical): No   . Lack of Transportation (Non-Medical): No  Physical Activity: Unknown (01/09/2024)   Exercise Vital Sign   . Days of Exercise per Week: 6 days   . Minutes of Exercise per Session: Not on file  Stress: Stress Concern Present (01/09/2024)   [employer] of Occupational Health - Occupational Stress Questionnaire   . Feeling of Stress: Rather much  Social Connections: Socially Integrated  (01/09/2024)   Social Connection and Isolation Panel   . Frequency of Communication with Friends and Family: More than three times a week   . Frequency of Social Gatherings with Friends and Family: More than three times a week   . Attends Religious Services: More than 4 times per year   . Active Member of Clubs or Organizations: Yes   . Attends Banker Meetings: More than 4 times per year   . Marital Status: Married   Family History  Problem Relation Age of Onset  . Diabetes Father   . Colon polyps Brother   . Alcohol abuse Brother   . Hyperlipidemia Brother   . Colon cancer Maternal Grandmother   . Esophageal cancer Neg Hx   . Rectal cancer Neg Hx   . Stomach cancer Neg Hx   . Crohn's disease Neg Hx    Allergies  Allergen Reactions  . Niaspan [Niacin Er (Antihyperlipidemic)] Other (See Comments)    Severe flushing   Prior to Admission medications   Medication Sig Start Date End Date Taking? Authorizing Provider  ALPRAZolam  (XANAX ) 0.5 MG tablet Take 1 tablet (0.5 mg total) by mouth 3 (three) times daily as needed for anxiety. 02/13/24  Yes Duanne Butler DASEN, MD  aspirin 81 MG chewable tablet Chew 1 tablet (81 mg total) by mouth 2 (two)  times daily. To be taken after surgery to prevent blood clots 03/17/24 03/17/25 Yes Jule Ronal CROME, PA-C  aspirin 81 MG tablet Take 81 mg by mouth daily.   Yes [provider]  calcium  carbonate (CALCIUM  600) 600 MG TABS tablet Take 1 tablet (600 mg total) by mouth 2 (two) times daily with a meal. 03/06/21  Yes Pickard, Butler DASEN, MD  celecoxib  (CELEBREX ) 200 MG capsule Take 1 capsule (200 mg total) by mouth 2 (two) times daily. 11/26/23  Yes Duanne Butler DASEN, MD  Cholecalciferol (VITAMIN D3) 50 MCG (2000 UT) capsule Take 1 capsule (2,000 Units total) by mouth daily. 03/06/21  Yes Duanne Butler DASEN, MD  docusate sodium (COLACE) 100 MG capsule Take 1 capsule (100 mg total) by mouth daily as needed. 03/17/24 03/17/25 Yes Jule Ronal CROME, PA-C  escitalopram  (LEXAPRO ) 10 MG tablet Take 1 tablet (10 mg total) by mouth daily. 03/20/24  Yes Duanne Butler DASEN, MD  ezetimibe  (ZETIA ) 10 MG tablet Take 1 tablet (10 mg total) by mouth daily. 10/16/23  Yes Duanne Butler DASEN, MD  methocarbamol  (ROBAXIN ) 750 MG tablet Take 1 tablet (750 mg total) by mouth 3 (three) times daily as needed. 03/17/24  Yes Jule Ronal CROME, PA-C  Multiple Vitamin (MULTIVITAMIN) tablet Take 1 tablet by mouth daily.   Yes [provider]  Omega-3 Fatty Acids (FISH OIL CONCENTRATE PO) Take 1 capsule by mouth 2 (two) times daily.   Yes [provider]  ondansetron (ZOFRAN) 4 MG tablet Take 1 tablet (4 mg total) by mouth every 8 (eight) hours as needed for nausea or vomiting. 03/17/24  Yes Jule Ronal CROME, PA-C  oxyCODONE-acetaminophen  (PERCOCET) 5-325 MG tablet Take 1-2 tablets by mouth every 6 (six) hours as needed. To be taken after surgery 03/17/24  Yes Jule Ronal L, PA-C  ZEPBOUND  2.5 MG/0.5ML injection vial INJECT 0.5 ML (2.5 MG) UNDER THE SKIN ONCE WEEKLY (0.5ML= 50 UNITS) 01/14/24  Yes Duanne Butler DASEN, MD  orlistat  (XENICAL ) 120 MG capsule Take 1 capsule (120 mg total) by mouth 3 (three) times daily with meals. Patient not taking: Reported on 03/27/2024 07/31/22   Duanne Butler DASEN, MD     Positive ROS: All other systems have been reviewed and were otherwise negative with the exception of those mentioned in the HPI and as above.  Physical Exam: General: Alert, no acute distress Cardiovascular: No pedal edema Respiratory: No cyanosis, no use of accessory musculature GI: abdomen soft Skin: No lesions in the area of chief complaint Neurologic: Sensation intact distally Psychiatric: Patient is competent for consent with normal mood and affect Lymphatic: no lymphedema  MUSCULOSKELETAL: exam stable  Assessment: right hip osteoarthritis  Plan: Plan for Procedure(s): ARTHROPLASTY, HIP, TOTAL, ANTERIOR APPROACH  The risks  benefits and alternatives were discussed with the patient including but not limited to the risks of nonoperative treatment, versus surgical intervention including infection, bleeding, nerve injury,  blood clots, cardiopulmonary complications, morbidity, mortality, among others, and they were willing to proceed.   Ozell Cummins, MD 04/06/2024 5:50 AM

## 2024-04-06 NOTE — Evaluation (Addendum)
 Physical Therapy Evaluation Patient Details Name: Mike Perez MRN: [id number] DOB: 08/20/1960 Today's Date: 04/06/2024  History of Present Illness  63 y.o. male presents to [facility] hospital on 04/06/2024 for elective R THA. PMH includes ascending aortic aneurysm.  Clinical Impression  Pt presents to PT with deficits in functional mobility, gait, balance, strength, ROM. Pt is able to ambulate for household distances with support of the RW. PT later ambulates for ~100' with support of SPC, demonstrating fair stability. PT suggests utilizing a RW initially in the case of increased pain and edema on POD 1 and 2 after surgery. PT provides education on the THA exercise packet and encourages frequent mobilization with staff assistance. Pt negotiates one step multiple times with limited use of railing for stability. PT will follow up tomorrow for a progression of gait and stair training if he remains admitted overnight.           If plan is discharge home, recommend the following: A little help with bathing/dressing/bathroom;Assistance with cooking/housework;Assist for transportation;Help with stairs or ramp for entrance   Can travel by private vehicle        Equipment Recommendations Rolling walker (2 wheels)  Recommendations for Other Services       Functional Status Assessment Patient has had a recent decline in their functional status and demonstrates the ability to make significant improvements in function in a reasonable and predictable amount of time.     Precautions / Restrictions Precautions Precautions: Fall Recall of Precautions/Restrictions: Intact Precaution/Restrictions Comments: direct anterior THA, no precautions Restrictions Weight Bearing Restrictions Per Provider Order: Yes RLE Weight Bearing Per Provider Order: Weight bearing as tolerated      Mobility  Bed Mobility Overal bed mobility: Needs Assistance Bed Mobility: Supine to Sit     Supine to sit: Supervision           Transfers Overall transfer level: Needs assistance Equipment used: Rolling walker (2 wheels) Transfers: Sit to/from Stand Sit to Stand: Supervision                Ambulation/Gait Ambulation/Gait assistance: Supervision Gait Distance (Feet): 250 Feet Assistive device: Rolling walker (2 wheels) Gait Pattern/deviations: Step-through pattern Gait velocity: reduced Gait velocity interpretation: 1.31 - 2.62 ft/sec, indicative of limited community ambulator   General Gait Details: steady step-through gait, reduced stance time on RLE  Stairs Stairs: Yes Stairs assistance: Supervision Stair Management: One rail Left, Step to pattern, Forwards Number of Stairs: 1 (1 step x 2 trials, pt utilizing only fingertips on left rail)    Wheelchair Mobility     Tilt Bed    Modified Rankin (Stroke Patients Only)       Balance Overall balance assessment: Needs assistance Sitting-balance support: No upper extremity supported, Feet supported Sitting balance-Leahy Scale: Good     Standing balance support: Single extremity supported, Reliant on assistive device for balance Standing balance-Leahy Scale: Poor                               Pertinent Vitals/Pain Pain Assessment Pain Assessment: Faces Faces Pain Scale: Hurts little more Pain Location: R hip Pain Descriptors / Indicators: Sore Pain Intervention(s): Monitored during session    Home Living Family/patient expects to be discharged to:: Private residence Living Arrangements: Spouse/significant other Available Help at Discharge: Family;Available PRN/intermittently Type of Home: House Home Access: Stairs to enter Entrance Stairs-Rails: None Entrance Stairs-Number of Steps: 1   Home Layout: One level  Home Equipment: [place] - single point      Prior Function Prior Level of Function : Independent/Modified Independent;Driving             Mobility Comments: ambulatory without DME        Extremity/Trunk Assessment   Upper Extremity Assessment Upper Extremity Assessment: Overall WFL for tasks assessed    Lower Extremity Assessment Lower Extremity Assessment: RLE deficits/detail RLE Deficits / Details: generalized post-op weakness as anticipated on POD 0    Cervical / Trunk Assessment Cervical / Trunk Assessment: Normal  Communication   Communication Communication: No apparent difficulties    Cognition Arousal: Alert Behavior During Therapy: WFL for tasks assessed/performed   PT - Cognitive impairments: No apparent impairments                         Following commands: Intact       Cueing Cueing Techniques: Verbal cues     General Comments General comments (skin integrity, edema, etc.): VSS on RA    Exercises Other Exercises Other Exercises: PT provides education on surgical hip exercise packet   Assessment/Plan    PT Assessment Patient needs continued PT services  PT Problem List Decreased strength;Decreased activity tolerance;Decreased balance;Decreased mobility;Decreased knowledge of use of DME;Pain       PT Treatment Interventions DME instruction;Gait training;Stair training;Functional mobility training;Therapeutic activities;Therapeutic exercise;Balance training;Neuromuscular re-education;Patient/family education    PT Goals (Current goals can be found in the Care Plan section)  Acute Rehab PT Goals Patient Stated Goal: to return home PT Goal Formulation: With patient Time For Goal Achievement: 04/10/24 Potential to Achieve Goals: Good    Frequency 7X/week     Co-evaluation               AM-PAC PT 6 Clicks Mobility  Outcome Measure Help needed turning from your back to your side while in a flat bed without using bedrails?: A Little Help needed moving from lying on your back to sitting on the side of a flat bed without using bedrails?: A Little Help needed moving to and from a bed to a chair (including a  wheelchair)?: A Little Help needed standing up from a chair using your arms (e.g., wheelchair or bedside chair)?: A Little Help needed to walk in hospital room?: A Little Help needed climbing 3-5 steps with a railing? : A Little 6 Click Score: 18    End of Session Equipment Utilized During Treatment: Gait belt Activity Tolerance: Patient tolerated treatment well Patient left: in chair;with call bell/phone within reach Nurse Communication: Mobility status PT Visit Diagnosis: Other abnormalities of gait and mobility (R26.89);Muscle weakness (generalized) (M62.81)    Time: 8696-8663 PT Time Calculation (min) (ACUTE ONLY): 33 min   Charges:   PT Evaluation $PT Eval Low Complexity: 1 Low   PT General Charges $$ ACUTE PT VISIT: 1 Visit         Bernardino JINNY Ruth, PT, DPT Acute Rehabilitation Office [phone]   Bernardino JINNY Ruth 04/06/2024, 2:44 PM

## 2024-04-06 NOTE — Discharge Instructions (Signed)

## 2024-04-07 ENCOUNTER — Encounter (HOSPITAL_COMMUNITY): Payer: Self-pay | Admitting: Orthopaedic Surgery

## 2024-04-08 NOTE — Discharge Summary (Signed)
 [name] MRN: [id number] DOB/AGE: 62-23-1962 62 y.o.  Admit date: 04/06/2024 Discharge date: 04/06/2024  Admission Diagnoses:  Primary osteoarthritis of right hip  Discharge Diagnoses:  Principal Problem:   Primary osteoarthritis of right hip Active Problems:   Status post total replacement of right hip   Past Medical History:  Diagnosis Date   Arthritis    bilateral,hands,hips   Ascending aortic aneurysm    monitored yearly thru PCP   Hx of adenomatous polyp of colon 07/08/2014   Hyperlipidemia    Low back pain     Surgeries: Procedure(s): ARTHROPLASTY, HIP, TOTAL, ANTERIOR APPROACH on 04/06/2024   Consultants (if any):   Discharged Condition: Improved  Hospital Course: Mike Perez is an 63 y.o. male who was admitted 04/06/2024 with a diagnosis of Primary osteoarthritis of right hip and went to the operating room on 04/06/2024 and underwent the above named procedures.    He was given perioperative antibiotics:  Anti-infectives (From admission, onward)    Start     Dose/Rate Route Frequency Ordered Stop   04/06/24 1400  ceFAZolin (ANCEF) IVPB 2g/100 mL premix        2 g 200 mL/hr over 30 Minutes Intravenous Every 6 hours 04/06/24 1113 04/06/24 2007   04/06/24 0751  vancomycin (VANCOCIN) powder  Status:  Discontinued          As needed 04/06/24 0751 04/06/24 0851   04/06/24 0600  ceFAZolin (ANCEF) IVPB 2g/100 mL premix        2 g 200 mL/hr over 30 Minutes Intravenous On call to O.R. 04/06/24 [id number] 04/06/24 0740     .  He was given sequential compression devices, early ambulation, and appropriate chemoprophylaxis for DVT prophylaxis.  He benefited maximally from the hospital stay and there were no complications.    Recent vital signs:  Vitals:   04/06/24 1602 04/06/24 1923  BP: (!) 112/55 111/67  Pulse: 75 75  Resp: 20 18  Temp: 98.8 F (37.1 C) 98.7 F (37.1 C)  SpO2: 95% 96%    Recent laboratory studies:  Lab Results   Component Value Date   HGB 13.1 03/27/2024   HGB 14.1 07/01/2023   HGB 14.4 07/20/2022   Lab Results  Component Value Date   WBC 6.4 03/27/2024   PLT 191 03/27/2024   No results found for: INR Lab Results  Component Value Date   NA 138 03/27/2024   K 4.0 03/27/2024   CL 103 03/27/2024   CO2 26 03/27/2024   BUN 17 03/27/2024   CREATININE 0.67 03/27/2024   GLUCOSE 89 03/27/2024    Discharge Medications:   Allergies as of 04/06/2024       Reactions   Niaspan [niacin Er (antihyperlipidemic)] Other (See Comments)   Severe flushing        Medication List     STOP taking these medications    celecoxib  200 MG capsule Commonly known as: CeleBREX    FISH OIL CONCENTRATE PO   orlistat  120 MG capsule Commonly known as: XENICAL        TAKE these medications    ALPRAZolam  0.5 MG tablet Commonly known as: XANAX  Take 1 tablet (0.5 mg total) by mouth 3 (three) times daily as needed for anxiety.   Aspirin Low Dose 81 MG chewable tablet Generic drug: aspirin Chew 1 tablet (81 mg total) by mouth 2 (two) times daily. To be taken after surgery to prevent blood clots What changed: Another medication with the same name  was removed. Continue taking this medication, and follow the directions you see here.   calcium  carbonate 600 MG Tabs tablet Commonly known as: Calcium  600 Take 1 tablet (600 mg total) by mouth 2 (two) times daily with a meal.   docusate sodium  100 MG capsule Commonly known as: Colace Take 1 capsule (100 mg total) by mouth daily as needed.   escitalopram  10 MG tablet Commonly known as: Lexapro  Take 1 tablet (10 mg total) by mouth daily.   ezetimibe  10 MG tablet Commonly known as: Zetia  Take 1 tablet (10 mg total) by mouth daily.   methocarbamol  750 MG tablet Commonly known as: ROBAXIN  Take 1 tablet (750 mg total) by mouth 3 (three) times daily as needed.   multivitamin tablet Take 1 tablet by mouth daily.   ondansetron  4 MG tablet Commonly  known as: Zofran  Take 1 tablet (4 mg total) by mouth every 8 (eight) hours as needed for nausea or vomiting.   oxyCODONE -acetaminophen  5-325 MG tablet Commonly known as: Percocet Take 1-2 tablets by mouth every 6 (six) hours as needed. To be taken after surgery   Vitamin D3 50 MCG (2000 UT) capsule Take 1 capsule (2,000 Units total) by mouth daily.   Zepbound  2.5 MG/0.5ML injection vial Generic drug: tirzepatide  INJECT 0.5 ML (2.5 MG) UNDER THE SKIN ONCE WEEKLY (0.5ML= 50 UNITS)        Diagnostic Studies: DG Pelvis Portable Result Date: 04/06/2024 CLINICAL DATA:  Right hip replacement. EXAM: PORTABLE PELVIS 1-2 VIEWS COMPARISON:  None Available. FINDINGS: Right hip arthroplasty in expected alignment. No periprosthetic lucency or fracture. Recent postsurgical change includes air and edema in the soft tissues. IMPRESSION: Right hip arthroplasty without immediate postoperative complication. Electronically Signed   By: Andrea Gasman M.D.   On: 04/06/2024 12:18   DG HIP UNILAT WITH PELVIS 1V RIGHT Result Date: 04/06/2024 CLINICAL DATA:  Elective surgery. EXAM: DG HIP (WITH OR WITHOUT PELVIS) 1V RIGHT COMPARISON:  Preoperative imaging. FINDINGS: Four fluoroscopic spot views of the pelvis and right hip obtained in the operating room. Images during hip arthroplasty. Fluoroscopy time 23 seconds. Dose 3 mGy. IMPRESSION: Intraoperative fluoroscopy during right hip arthroplasty. Electronically Signed   By: Andrea Gasman M.D.   On: 04/06/2024 12:17   DG C-Arm 1-60 Min-No Report Result Date: 04/06/2024 Fluoroscopy was utilized by the requesting physician.  No radiographic interpretation.   DG C-Arm 1-60 Min-No Report Result Date: 04/06/2024 Fluoroscopy was utilized by the requesting physician.  No radiographic interpretation.    Disposition: Discharge disposition: 01-Home or Self Care          Follow-up Information     Jule Ronal CROME, PA-C. Schedule an appointment as soon as  possible for a visit in 2 week(s).   Specialty: Orthopedic Surgery Contact information: [address] [place]  [place] [address] [phone]                  Signed: Ozell Cummins 04/08/2024, 2:07 PM

## 2024-04-11 ENCOUNTER — Ambulatory Visit (HOSPITAL_BASED_OUTPATIENT_CLINIC_OR_DEPARTMENT_OTHER): Attending: Orthopaedic Surgery | Admitting: Physical Therapy

## 2024-04-11 ENCOUNTER — Encounter (HOSPITAL_BASED_OUTPATIENT_CLINIC_OR_DEPARTMENT_OTHER): Payer: Self-pay | Admitting: Physical Therapy

## 2024-04-11 ENCOUNTER — Other Ambulatory Visit: Payer: Self-pay

## 2024-04-11 DIAGNOSIS — Z471 Aftercare following joint replacement surgery: Secondary | ICD-10-CM | POA: Diagnosis not present

## 2024-04-11 DIAGNOSIS — Z96641 Presence of right artificial hip joint: Secondary | ICD-10-CM | POA: Insufficient documentation

## 2024-04-11 DIAGNOSIS — R262 Difficulty in walking, not elsewhere classified: Secondary | ICD-10-CM | POA: Diagnosis not present

## 2024-04-11 DIAGNOSIS — M6281 Muscle weakness (generalized): Secondary | ICD-10-CM | POA: Diagnosis not present

## 2024-04-11 DIAGNOSIS — M1611 Unilateral primary osteoarthritis, right hip: Secondary | ICD-10-CM | POA: Diagnosis present

## 2024-04-11 DIAGNOSIS — M25551 Pain in right hip: Secondary | ICD-10-CM | POA: Diagnosis not present

## 2024-04-11 NOTE — Therapy (Signed)
 OUTPATIENT PHYSICAL THERAPY EVALUATION   Patient Name: Mike Perez MRN: [id number] DOB:April 19, 1961, 63 y.o., male Today's Date: 04/11/2024  END OF SESSION:  PT End of Session - 04/11/24 1045     Visit Number 1    Number of Visits 12    Date for Recertification  05/23/24    PT Start Time 1045    PT Stop Time 1118    PT Time Calculation (min) 33 min    Activity Tolerance Patient tolerated treatment well    Behavior During Therapy [facility] for tasks assessed/performed          Past Medical History:  Diagnosis Date   Arthritis    bilateral,hands,hips   Ascending aortic aneurysm    monitored yearly thru PCP   Hx of adenomatous polyp of colon 07/08/2014   Hyperlipidemia    Low back pain    Past Surgical History:  Procedure Laterality Date   COLONOSCOPY  09/17/2003   normal dr Vicci   HERNIA REPAIR     1971   POLYPECTOMY     TOTAL HIP ARTHROPLASTY Right 04/06/2024   Procedure: ARTHROPLASTY, HIP, TOTAL, ANTERIOR APPROACH;  Surgeon: Jerri Kay HERO, MD;  Location: [facility] OR;  Service: Orthopedics;  Laterality: Right;  3-C   Patient Active Problem List   Diagnosis Date Noted   Status post total replacement of right hip 04/06/2024   Ascending aortic aneurysm 02/26/2022   Conductive hearing loss, bilateral 12/07/2021   Bilateral impacted cerumen 12/07/2021   Low testosterone  in male 06/22/2021   Central serous retinopathy 02/07/2021   Primary osteoarthritis of right hip 10/30/2019   Primary osteoarthritis of left hip 10/30/2019   Tendinosis of rotator cuff 10/30/2019   Tendinopathy of right biceps tendon 10/30/2019   Hx of adenomatous polyp of colon 07/08/2014   Prediabetes 04/07/2013   Low back pain     PCP: Butler Burr, MD  REFERRING PROVIDER:  Jerri Kay HERO, MD      REFERRING DIAG:  M16.11 (ICD-10-CM) - Primary osteoarthritis of right hip   Per op note: Operative Findings Severe OA Successful correction of leg length discrepancy   Operative Procedures   1. Total hip replacement; Right hip; uncemented [id number]   Rationale for Evaluation and Treatment: Rehabilitation  THERAPY DIAG:  Pain in right hip  Difficulty in walking, not elsewhere classified  Muscle weakness (generalized)  ONSET DATE: DOS 04/06/24   SUBJECTIVE:                                                                                                                                                                                           SUBJECTIVE  STATEMENT: Today the bone feels very tender.  Before surgery, I had a bad limp for years. Groin pain walking up the hill in the back yard. About fell 2x  at church about 2 weeks ago but no buckling prior. LLD prior to sugery- Rt shorter- over the last year it was getting worse after getting up.  I work on trucks, crouch or roll under. I used to be able to hop right up but recently I have had to go slow and use my arms to help.  I was actually walking without the walker some today.   PERTINENT HISTORY:  no  PAIN:  Are you having pain? Yes: NPRS scale: 6/10 Pain location: Rt  lateral and anterior distal thigh Pain description: discomfort Aggravating factors: stretching quads Relieving factors: rest  PRECAUTIONS:  None  RED FLAGS: None   WEIGHT BEARING RESTRICTIONS:  No  FALLS:  Has patient fallen in last 6 months? No  LIVING ENVIRONMENT: Has a basement with stairs  OCCUPATION:  Works on trucks, has to get up/down from floor.  Would like to start going in next week and back to some work after thanksgiving.   PLOF:  Independent  PATIENT GOALS:  Back to working on trucks   OBJECTIVE:  Note: Objective measures were completed at Evaluation unless otherwise noted.   PATIENT SURVEYS:  LEFS  Extreme difficulty/unable (0), Quite a bit of difficulty (1), Moderate difficulty (2), Little difficulty (3), No difficulty (4) Survey date:    Any of your usual work, housework or school activities 2  2. Usual  hobbies, recreational or sporting activities   3. Getting into/out of the bath 2  4. Walking between rooms 3  5. Putting on socks/shoes 1  6. Squatting    7. Lifting an object, like a bag of groceries from the floor   8. Performing light activities around your home 2  9. Performing heavy activities around your home   10. Getting into/out of a car 3  11. Walking 2 blocks   12. Walking 1 mile   13. Going up/down 10 stairs (1 flight)   14. Standing for 1 hour   15.  sitting for 1 hour 2  16. Running on even ground   17. Running on uneven ground   18. Making sharp turns while running fast   19. Hopping    20. Rolling over in bed 3  Score total:  18/80     COGNITIVE STATUS: Within functional limits for tasks assessed   SENSATION: WFL  EDEMA:  No  POSTURE:  Eval: trunk flexion upon standing  GAIT: Comments: EVAL  trunk flexion, lacking hip extension on right   Body Part #1 Hip    LOWER EXTREMITY ROM:     Active  Right eval   Hip flexion 90   Hip extension 0   Hip abduction    Hip adduction    Hip internal rotation    Hip external rotation    Knee flexion    Knee extension     (Blank rows = not tested)   TREATMENT DATE:   EVAL See HEP    PATIENT EDUCATION:  Education details: Anatomy of condition, POC, HEP, exercise form/rationale Person educated: Patient and Spouse Education method: Explanation, Demonstration, Tactile cues, Verbal cues, and Handouts Education comprehension: verbalized understanding, returned demonstration, verbal cues required, tactile cues required, and needs further education   HOME EXERCISE PROGRAM: Access Code: [email or web address] URL: [email or web address] Date: 04/11/2024 Prepared by: Harlene Cordon  Exercises -  Standing Gluteal Sets  - 7 x weekly - Staggered Stance Forward Backward Weight Shift with Counter Support  - 3-5 x daily - 7 x weekly - 1 sets - 10 reps - Seated Hamstring Stretch  - 2-3 x daily - 7 x weekly  - 2 sets - 3 breaths hold - Seated Knee Extension AROM  - 2-3 x daily - 7 x weekly - 2 sets - 10 reps - Standing Hip Abduction with Counter Support  - 2-3 x daily - 7 x weekly - 1 sets - 10 reps - Side Stepping with Counter Support  - 2-3 x daily - 7 x weekly - 3 sets - 10 reps - Seated Hip Flexion Toward Target  - 7 x weekly   ASSESSMENT:  CLINICAL IMPRESSION: Patient is a 63 y.o. M who was seen today for physical therapy evaluation and treatment for s/p Rt THA and LLD correction.    REHAB POTENTIAL: Good  CLINICAL DECISION MAKING: Stable/uncomplicated  EVALUATION COMPLEXITY: Low   GOALS: Goals reviewed with patient? Yes  SHORT TERM GOALS: Target date: 12/13  Household ambulation without AD, without increased pain Baseline: Goal status: INITIAL  2.  Demo hip ext at least 10 deg for normalized gait pattern Baseline:  Goal status: INITIAL    LONG TERM GOALS: Target date: POC date  Lunge to/from floor with UE assist for balance Baseline:  Goal status: INITIAL  2.  Hip abd strength 80% of opposite LE Baseline:  Goal status: INITIAL  3.  LEFS to improve by MDC x2 Baseline:  Goal status: INITIAL  4.  Community ambulation without AD, without antalgic pattern Baseline:  Goal status: INITIAL  5.  5TSTS 12s or less without UE use Baseline:  Goal status: INITIAL    PLAN:  PT FREQUENCY: 1-2x/week  PT DURATION: POC date  PLANNED INTERVENTIONS: 97164- PT Re-evaluation, 97750- Physical Performance Testing, 97110-Therapeutic exercises, 97530- Therapeutic activity, [id number]- Neuromuscular re-education, 97535- Self Care, [id number]- Manual therapy, [id number]- Gait training, [phone]- Aquatic Therapy, [phone] (1-2 muscles), [id number] (3+ muscles)- Dry Needling, Patient/Family education, Balance training, Stair training, Taping, Joint mobilization, Spinal mobilization, Scar mobilization, and Cryotherapy.  PLAN FOR NEXT SESSION: quad lengthening, glut strength   Harlene Cordon, PT,  DPT 04/11/2024, 11:50 AM

## 2024-04-14 ENCOUNTER — Encounter (HOSPITAL_BASED_OUTPATIENT_CLINIC_OR_DEPARTMENT_OTHER): Payer: Self-pay | Admitting: Physical Therapy

## 2024-04-14 ENCOUNTER — Ambulatory Visit (HOSPITAL_BASED_OUTPATIENT_CLINIC_OR_DEPARTMENT_OTHER): Admitting: Physical Therapy

## 2024-04-14 DIAGNOSIS — M25551 Pain in right hip: Secondary | ICD-10-CM | POA: Diagnosis not present

## 2024-04-14 DIAGNOSIS — R262 Difficulty in walking, not elsewhere classified: Secondary | ICD-10-CM | POA: Diagnosis not present

## 2024-04-14 DIAGNOSIS — M6281 Muscle weakness (generalized): Secondary | ICD-10-CM

## 2024-04-14 DIAGNOSIS — Z96641 Presence of right artificial hip joint: Secondary | ICD-10-CM | POA: Diagnosis not present

## 2024-04-14 DIAGNOSIS — Z471 Aftercare following joint replacement surgery: Secondary | ICD-10-CM | POA: Diagnosis not present

## 2024-04-14 NOTE — Therapy (Signed)
 OUTPATIENT PHYSICAL THERAPY EVALUATION   Patient Name: Mike Perez MRN: [id number] DOB:Jan 10, 1961, 63 y.o., male Today's Date: 04/14/2024  END OF SESSION:  PT End of Session - 04/14/24 0942     Visit Number 2    Number of Visits 12    Date for Recertification  05/23/24    PT Start Time 0930    PT Stop Time 1012    PT Time Calculation (min) 42 min    Activity Tolerance Patient tolerated treatment well    Behavior During Therapy [facility] for tasks assessed/performed           Past Medical History:  Diagnosis Date   Arthritis    bilateral,hands,hips   Ascending aortic aneurysm    monitored yearly thru PCP   Hx of adenomatous polyp of colon 07/08/2014   Hyperlipidemia    Low back pain    Past Surgical History:  Procedure Laterality Date   COLONOSCOPY  09/17/2003   normal dr Vicci   HERNIA REPAIR     1971   POLYPECTOMY     TOTAL HIP ARTHROPLASTY Right 04/06/2024   Procedure: ARTHROPLASTY, HIP, TOTAL, ANTERIOR APPROACH;  Surgeon: Jerri Kay HERO, MD;  Location: [facility] OR;  Service: Orthopedics;  Laterality: Right;  3-C   Patient Active Problem List   Diagnosis Date Noted   Status post total replacement of right hip 04/06/2024   Ascending aortic aneurysm 02/26/2022   Conductive hearing loss, bilateral 12/07/2021   Bilateral impacted cerumen 12/07/2021   Low testosterone  in male 06/22/2021   Central serous retinopathy 02/07/2021   Primary osteoarthritis of right hip 10/30/2019   Primary osteoarthritis of left hip 10/30/2019   Tendinosis of rotator cuff 10/30/2019   Tendinopathy of right biceps tendon 10/30/2019   Hx of adenomatous polyp of colon 07/08/2014   Prediabetes 04/07/2013   Low back pain     PCP: Butler Burr, MD  REFERRING PROVIDER:  Jerri Kay HERO, MD      REFERRING DIAG:  M16.11 (ICD-10-CM) - Primary osteoarthritis of right hip   Per op note: Operative Findings Severe OA Successful correction of leg length discrepancy   Operative Procedures   1. Total hip replacement; Right hip; uncemented cpt-27130   Rationale for Evaluation and Treatment: Rehabilitation  THERAPY DIAG:  Pain in right hip  Difficulty in walking, not elsewhere classified  Muscle weakness (generalized)  ONSET DATE: DOS 04/06/24   SUBJECTIVE:  SUBJECTIVE STATEMENT: No pain today in hip, still feeling the bone pain in the thigh. Some soreness in lateral hip after doing a lot of walking today. Walking with cane, feeling good.   PERTINENT HISTORY:  no  PAIN:  Are you having pain? Yes: NPRS scale: 2/10 Pain location: Rt  lateral and anterior distal thigh Pain description: discomfort Aggravating factors: stretching quads Relieving factors: rest  PRECAUTIONS:  None  RED FLAGS: None   WEIGHT BEARING RESTRICTIONS:  No  FALLS:  Has patient fallen in last 6 months? No  LIVING ENVIRONMENT: Has a basement with stairs  OCCUPATION:  Works on trucks, has to get up/down from floor.  Would like to start going in next week and back to some work after thanksgiving.   PLOF:  Independent  PATIENT GOALS:  Back to working on trucks   OBJECTIVE:  Note: Objective measures were completed at Evaluation unless otherwise noted.   PATIENT SURVEYS:  LEFS  Extreme difficulty/unable (0), Quite a bit of difficulty (1), Moderate difficulty (2), Little difficulty (3), No difficulty (4) Survey date:    Any of your usual work, housework or school activities 2  2. Usual hobbies, recreational or sporting activities   3. Getting into/out of the bath 2  4. Walking between rooms 3  5. Putting on socks/shoes 1  6. Squatting    7. Lifting an object, like a bag of groceries from the floor   8. Performing light activities around your home 2  9. Performing heavy activities around your  home   10. Getting into/out of a car 3  11. Walking 2 blocks   12. Walking 1 mile   13. Going up/down 10 stairs (1 flight)   14. Standing for 1 hour   15.  sitting for 1 hour 2  16. Running on even ground   17. Running on uneven ground   18. Making sharp turns while running fast   19. Hopping    20. Rolling over in bed 3  Score total:  18/80     COGNITIVE STATUS: Within functional limits for tasks assessed   SENSATION: WFL  EDEMA:  No  POSTURE:  Eval: trunk flexion upon standing  GAIT: Comments: EVAL  trunk flexion, lacking hip extension on right   Body Part #1 Hip    LOWER EXTREMITY ROM:     Active  Right eval   Hip flexion 90   Hip extension 0   Hip abduction    Hip adduction    Hip internal rotation    Hip external rotation    Knee flexion    Knee extension     (Blank rows = not tested)   TREATMENT DATE:   04/14/24 Nu step L4 4 min Discussed shoe- needs wider BOS for collapsing arch and Rt foot inversion through stance phase Seated add with LAQ to HS curl Self roller to quads in seated Practiced in/out of bed, in/out of car, dressing Seated hip hinge with ball bw knees Toe scrunches & toe yoga    PATIENT EDUCATION:  Education details: Anatomy of condition, POC, HEP, exercise form/rationale Person educated: Patient and Spouse Education method: Explanation, Demonstration, Tactile cues, Verbal cues, and Handouts Education comprehension: verbalized understanding, returned demonstration, verbal cues required, tactile cues required, and needs further education   HOME EXERCISE PROGRAM: Access Code: [email or web address] URL: [email or web address] Date: 04/11/2024 Prepared by: Harlene Cordon    ASSESSMENT:  CLINICAL IMPRESSION: Soreness when laying on Rt side to get into and out of  bed as expected but encouraged pt that he will not create damage in getting into/out of bed. Limited hip flexion mobility to reach and tie shoes but we  discussed moving to a more stable shoe as mobility improves.    REHAB POTENTIAL: Good  CLINICAL DECISION MAKING: Stable/uncomplicated  EVALUATION COMPLEXITY: Low   GOALS: Goals reviewed with patient? Yes  SHORT TERM GOALS: Target date: 12/13  Household ambulation without AD, without increased pain Baseline: Goal status: INITIAL  2.  Demo hip ext at least 10 deg for normalized gait pattern Baseline:  Goal status: INITIAL    LONG TERM GOALS: Target date: POC date  Lunge to/from floor with UE assist for balance Baseline:  Goal status: INITIAL  2.  Hip abd strength 80% of opposite LE Baseline:  Goal status: INITIAL  3.  LEFS to improve by MDC x2 Baseline:  Goal status: INITIAL  4.  Community ambulation without AD, without antalgic pattern Baseline:  Goal status: INITIAL  5.  5TSTS 12s or less without UE use Baseline:  Goal status: INITIAL    PLAN:  PT FREQUENCY: 1-2x/week  PT DURATION: POC date  PLANNED INTERVENTIONS: 97164- PT Re-evaluation, 97750- Physical Performance Testing, 97110-Therapeutic exercises, 97530- Therapeutic activity, [id number]- Neuromuscular re-education, 97535- Self Care, 02859- Manual therapy, [id number]- Gait training, [phone]- Aquatic Therapy, [phone] (1-2 muscles), 20561 (3+ muscles)- Dry Needling, Patient/Family education, Balance training, Stair training, Taping, Joint mobilization, Spinal mobilization, Scar mobilization, and Cryotherapy.  PLAN FOR NEXT SESSION: quad lengthening, glut strength   Harlene Cordon, PT, DPT 04/14/2024, 10:58 AM

## 2024-04-18 ENCOUNTER — Encounter (HOSPITAL_BASED_OUTPATIENT_CLINIC_OR_DEPARTMENT_OTHER): Payer: Self-pay | Admitting: Physical Therapy

## 2024-04-18 ENCOUNTER — Ambulatory Visit (HOSPITAL_BASED_OUTPATIENT_CLINIC_OR_DEPARTMENT_OTHER): Admitting: Physical Therapy

## 2024-04-18 DIAGNOSIS — R262 Difficulty in walking, not elsewhere classified: Secondary | ICD-10-CM | POA: Diagnosis not present

## 2024-04-18 DIAGNOSIS — M25551 Pain in right hip: Secondary | ICD-10-CM | POA: Diagnosis not present

## 2024-04-18 DIAGNOSIS — M6281 Muscle weakness (generalized): Secondary | ICD-10-CM | POA: Diagnosis not present

## 2024-04-18 DIAGNOSIS — Z471 Aftercare following joint replacement surgery: Secondary | ICD-10-CM | POA: Diagnosis not present

## 2024-04-18 DIAGNOSIS — Z96641 Presence of right artificial hip joint: Secondary | ICD-10-CM | POA: Diagnosis not present

## 2024-04-18 NOTE — Therapy (Signed)
 OUTPATIENT PHYSICAL THERAPY EVALUATION   Patient Name: Mike Perez MRN: [id number] DOB:[birth year], 63 y.o., male Today's Date: 04/18/2024  END OF SESSION:  PT End of Session - 04/18/24 0746     Visit Number 3    Number of Visits 12    Date for Recertification  05/23/24    PT Start Time 0746    PT Stop Time 0827    PT Time Calculation (min) 41 min    Activity Tolerance Patient tolerated treatment well    Behavior During Therapy [facility] for tasks assessed/performed            Past Medical History:  Diagnosis Date   Arthritis    bilateral,hands,hips   Ascending aortic aneurysm    monitored yearly thru PCP   Hx of adenomatous polyp of colon 07/08/2014   Hyperlipidemia    Low back pain    Past Surgical History:  Procedure Laterality Date   COLONOSCOPY  09/17/2003   normal dr Vicci   HERNIA REPAIR     1971   POLYPECTOMY     TOTAL HIP ARTHROPLASTY Right 04/06/2024   Procedure: ARTHROPLASTY, HIP, TOTAL, ANTERIOR APPROACH;  Surgeon: Jerri Kay HERO, MD;  Location: [facility] OR;  Service: Orthopedics;  Laterality: Right;  3-C   Patient Active Problem List   Diagnosis Date Noted   Status post total replacement of right hip 04/06/2024   Ascending aortic aneurysm 02/26/2022   Conductive hearing loss, bilateral 12/07/2021   Bilateral impacted cerumen 12/07/2021   Low testosterone  in male 06/22/2021   Central serous retinopathy 02/07/2021   Primary osteoarthritis of right hip 10/30/2019   Primary osteoarthritis of left hip 10/30/2019   Tendinosis of rotator cuff 10/30/2019   Tendinopathy of right biceps tendon 10/30/2019   Hx of adenomatous polyp of colon 07/08/2014   Prediabetes 04/07/2013   Low back pain     PCP: Butler Burr, MD  REFERRING PROVIDER:  Jerri Kay HERO, MD      REFERRING DIAG:  M16.11 (ICD-10-CM) - Primary osteoarthritis of right hip   Per op note: Operative Findings Severe OA Successful correction of leg length discrepancy   Operative  Procedures  1. Total hip replacement; Right hip; uncemented [id number]   Rationale for Evaluation and Treatment: Rehabilitation  THERAPY DIAG:  Pain in right hip  Difficulty in walking, not elsewhere classified  Muscle weakness (generalized)  ONSET DATE: DOS 04/06/24   SUBJECTIVE:  SUBJECTIVE STATEMENT: At night time I feel the discomfort but fine during the day.   PERTINENT HISTORY:  no  PAIN:  Are you having pain? Yes: NPRS scale: 2/10 Pain location: Rt  lateral and anterior distal thigh Pain description: discomfort Aggravating factors: stretching quads Relieving factors: rest  PRECAUTIONS:  None  RED FLAGS: None   WEIGHT BEARING RESTRICTIONS:  No  FALLS:  Has patient fallen in last 6 months? No  LIVING ENVIRONMENT: Has a basement with stairs  OCCUPATION:  Works on trucks, has to get up/down from floor.  Would like to start going in next week and back to some work after thanksgiving.   PLOF:  Independent  PATIENT GOALS:  Back to working on trucks   OBJECTIVE:  Note: Objective measures were completed at Evaluation unless otherwise noted.   PATIENT SURVEYS:  LEFS  Extreme difficulty/unable (0), Quite a bit of difficulty (1), Moderate difficulty (2), Little difficulty (3), No difficulty (4) Survey date:    Any of your usual work, housework or school activities 2  2. Usual hobbies, recreational or sporting activities   3. Getting into/out of the bath 2  4. Walking between rooms 3  5. Putting on socks/shoes 1  6. Squatting    7. Lifting an object, like a bag of groceries from the floor   8. Performing light activities around your home 2  9. Performing heavy activities around your home   10. Getting into/out of a car 3  11. Walking 2 blocks   12. Walking 1 mile    13. Going up/down 10 stairs (1 flight)   14. Standing for 1 hour   15.  sitting for 1 hour 2  16. Running on even ground   17. Running on uneven ground   18. Making sharp turns while running fast   19. Hopping    20. Rolling over in bed 3  Score total:  18/80     COGNITIVE STATUS: Within functional limits for tasks assessed   SENSATION: WFL  EDEMA:  No  POSTURE:  Eval: trunk flexion upon standing  GAIT: Comments: EVAL  trunk flexion, lacking hip extension on right   Body Part #1 Hip    LOWER EXTREMITY ROM:     Active  Right eval   Hip flexion 90   Hip extension 0   Hip abduction    Hip adduction    Hip internal rotation    Hip external rotation    Knee flexion    Knee extension     (Blank rows = not tested)   TREATMENT DATE:   04/18/24 Nu step 5 min L4 Seated hip hinge Seated hamstring stretch Sit<>stand pause at hinge, hover and seated hinge Gait- glut activation and reducing forward trunk lean at stance phase Prone alt hip ext Standing lunge slide back at wall- red tband  04/14/24 Nu step L4 4 min Discussed shoe- needs wider BOS for collapsing arch and Rt foot inversion through stance phase Seated add with LAQ to HS curl Self roller to quads in seated Practiced in/out of bed, in/out of car, dressing Seated hip hinge with ball bw knees Toe scrunches & toe yoga    PATIENT EDUCATION:  Education details: Anatomy of condition, POC, HEP, exercise form/rationale Person educated: Patient and Spouse Education method: Explanation, Demonstration, Tactile cues, Verbal cues, and Handouts Education comprehension: verbalized understanding, returned demonstration, verbal cues required, tactile cues required, and needs further education   HOME EXERCISE PROGRAM: Access Code: [email or web address] URL:  [email or web address] Date: 04/11/2024 Prepared by: Harlene Cordon    ASSESSMENT:  CLINICAL IMPRESSION: Tendency to sidebend to Lt and lacking  hip ext upon standing through Rt hip- improved with cuing. Will require proprioception training to achieve goals and upright posture.   REHAB POTENTIAL: Good  CLINICAL DECISION MAKING: Stable/uncomplicated  EVALUATION COMPLEXITY: Low   GOALS: Goals reviewed with patient? Yes  SHORT TERM GOALS: Target date: 12/13  Household ambulation without AD, without increased pain Baseline: Goal status: INITIAL  2.  Demo hip ext at least 10 deg for normalized gait pattern Baseline:  Goal status: INITIAL    LONG TERM GOALS: Target date: POC date  Lunge to/from floor with UE assist for balance Baseline:  Goal status: INITIAL  2.  Hip abd strength 80% of opposite LE Baseline:  Goal status: INITIAL  3.  LEFS to improve by MDC x2 Baseline:  Goal status: INITIAL  4.  Community ambulation without AD, without antalgic pattern Baseline:  Goal status: INITIAL  5.  5TSTS 12s or less without UE use Baseline:  Goal status: INITIAL    PLAN:  PT FREQUENCY: 1-2x/week  PT DURATION: POC date  PLANNED INTERVENTIONS: 97164- PT Re-evaluation, 97750- Physical Performance Testing, 97110-Therapeutic exercises, 97530- Therapeutic activity, [id number]- Neuromuscular re-education, 97535- Self Care, 02859- Manual therapy, [id number]- Gait training, [phone]- Aquatic Therapy, [phone] (1-2 muscles), 20561 (3+ muscles)- Dry Needling, Patient/Family education, Balance training, Stair training, Taping, Joint mobilization, Spinal mobilization, Scar mobilization, and Cryotherapy.  PLAN FOR NEXT SESSION: quad lengthening, glut strength   Harlene Cordon, PT, DPT 04/18/2024, 8:39 AM

## 2024-04-20 ENCOUNTER — Encounter (HOSPITAL_BASED_OUTPATIENT_CLINIC_OR_DEPARTMENT_OTHER): Payer: Self-pay | Admitting: Physical Therapy

## 2024-04-20 ENCOUNTER — Ambulatory Visit (HOSPITAL_BASED_OUTPATIENT_CLINIC_OR_DEPARTMENT_OTHER): Admitting: Physical Therapy

## 2024-04-20 DIAGNOSIS — R262 Difficulty in walking, not elsewhere classified: Secondary | ICD-10-CM | POA: Diagnosis present

## 2024-04-20 DIAGNOSIS — M6281 Muscle weakness (generalized): Secondary | ICD-10-CM | POA: Insufficient documentation

## 2024-04-20 DIAGNOSIS — M25551 Pain in right hip: Secondary | ICD-10-CM | POA: Insufficient documentation

## 2024-04-20 NOTE — Therapy (Signed)
 OUTPATIENT PHYSICAL THERAPY EVALUATION   Patient Name: Mike Perez MRN: [id number] DOB:[birth year], 63 y.o., male Today's Date: 04/20/2024  END OF SESSION:  PT End of Session - 04/20/24 1507     Visit Number 4    Number of Visits 12    Date for Recertification  05/23/24    PT Start Time 1450    PT Stop Time 1525    PT Time Calculation (min) 35 min    Activity Tolerance Patient tolerated treatment well    Behavior During Therapy [facility] for tasks assessed/performed            Past Medical History:  Diagnosis Date   Arthritis    bilateral,hands,hips   Ascending aortic aneurysm    monitored yearly thru PCP   Hx of adenomatous polyp of colon 07/08/2014   Hyperlipidemia    Low back pain    Past Surgical History:  Procedure Laterality Date   COLONOSCOPY  09/17/2003   normal dr Vicci   HERNIA REPAIR     1971   POLYPECTOMY     TOTAL HIP ARTHROPLASTY Right 04/06/2024   Procedure: ARTHROPLASTY, HIP, TOTAL, ANTERIOR APPROACH;  Surgeon: Jerri Kay HERO, MD;  Location: [facility] OR;  Service: Orthopedics;  Laterality: Right;  3-C   Patient Active Problem List   Diagnosis Date Noted   Status post total replacement of right hip 04/06/2024   Ascending aortic aneurysm 02/26/2022   Conductive hearing loss, bilateral 12/07/2021   Bilateral impacted cerumen 12/07/2021   Low testosterone  in male 06/22/2021   Central serous retinopathy 02/07/2021   Primary osteoarthritis of right hip 10/30/2019   Primary osteoarthritis of left hip 10/30/2019   Tendinosis of rotator cuff 10/30/2019   Tendinopathy of right biceps tendon 10/30/2019   Hx of adenomatous polyp of colon 07/08/2014   Prediabetes 04/07/2013   Low back pain     PCP: Butler Burr, MD  REFERRING PROVIDER:  Jerri Kay HERO, MD      REFERRING DIAG:  M16.11 (ICD-10-CM) - Primary osteoarthritis of right hip   Per op note: Operative Findings Severe OA Successful correction of leg length discrepancy   Operative  Procedures  1. Total hip replacement; Right hip; uncemented cpt-27130   Rationale for Evaluation and Treatment: Rehabilitation  THERAPY DIAG:  Pain in right hip  Difficulty in walking, not elsewhere classified  Muscle weakness (generalized)  ONSET DATE: DOS 04/06/24   SUBJECTIVE:  SUBJECTIVE STATEMENT: At night time I feel the discomfort but fine during the day.   PERTINENT HISTORY:  no  PAIN:  Are you having pain? Yes: NPRS scale: 2/10 Pain location: Rt  lateral and anterior distal thigh Pain description: discomfort Aggravating factors: stretching quads Relieving factors: rest  PRECAUTIONS:  None  RED FLAGS: None   WEIGHT BEARING RESTRICTIONS:  No  FALLS:  Has patient fallen in last 6 months? No  LIVING ENVIRONMENT: Has a basement with stairs  OCCUPATION:  Works on trucks, has to get up/down from floor.  Would like to start going in next week and back to some work after thanksgiving.   PLOF:  Independent  PATIENT GOALS:  Back to working on trucks   OBJECTIVE:  Note: Objective measures were completed at Evaluation unless otherwise noted.   PATIENT SURVEYS:  LEFS  Extreme difficulty/unable (0), Quite a bit of difficulty (1), Moderate difficulty (2), Little difficulty (3), No difficulty (4) Survey date:    Any of your usual work, housework or school activities 2  2. Usual hobbies, recreational or sporting activities   3. Getting into/out of the bath 2  4. Walking between rooms 3  5. Putting on socks/shoes 1  6. Squatting    7. Lifting an object, like a bag of groceries from the floor   8. Performing light activities around your home 2  9. Performing heavy activities around your home   10. Getting into/out of a car 3  11. Walking 2 blocks   12. Walking 1 mile    13. Going up/down 10 stairs (1 flight)   14. Standing for 1 hour   15.  sitting for 1 hour 2  16. Running on even ground   17. Running on uneven ground   18. Making sharp turns while running fast   19. Hopping    20. Rolling over in bed 3  Score total:  18/80     COGNITIVE STATUS: Within functional limits for tasks assessed   SENSATION: WFL  EDEMA:  No  POSTURE:  Eval: trunk flexion upon standing  GAIT: Comments: EVAL  trunk flexion, lacking hip extension on right   Body Part #1 Hip    LOWER EXTREMITY ROM:     Active  Right eval   Hip flexion 90   Hip extension 0   Hip abduction    Hip adduction    Hip internal rotation    Hip external rotation    Knee flexion    Knee extension     (Blank rows = not tested)   TREATMENT DATE:   12/1 Nu step L8 5 min Slant board squats DF & PF Sit<>stand with Lt foot fwd- 10lb kettle bell at chest  [employer] pew, chest press 5 lb kettle bell   04/18/24 Nu step 5 min L4 Seated hip hinge Seated hamstring stretch Sit<>stand pause at hinge, hover and seated hinge Gait- glut activation and reducing forward trunk lean at stance phase Prone alt hip ext Standing lunge slide back at wall- red tband  04/14/24 Nu step L4 4 min Discussed shoe- needs wider BOS for collapsing arch and Rt foot inversion through stance phase Seated add with LAQ to HS curl Self roller to quads in seated Practiced in/out of bed, in/out of car, dressing Seated hip hinge with ball bw knees Toe scrunches & toe yoga    PATIENT EDUCATION:  Education details: Anatomy of condition, POC, HEP, exercise form/rationale Person educated: Patient and Spouse Education  method: Explanation, Demonstration, Tactile cues, Verbal cues, and Handouts Education comprehension: verbalized understanding, returned demonstration, verbal cues required, tactile cues required, and needs further education   HOME EXERCISE PROGRAM: Access Code: [email or web address] URL:  [email or web address] Date: 04/11/2024 Prepared by: Harlene Cordon    ASSESSMENT:  CLINICAL IMPRESSION: Tendency to sidebend to Lt and lacking hip ext upon standing through Rt hip- improved with cuing. Will require proprioception training to achieve goals and upright posture.   REHAB POTENTIAL: Good  CLINICAL DECISION MAKING: Stable/uncomplicated  EVALUATION COMPLEXITY: Low   GOALS: Goals reviewed with patient? Yes  SHORT TERM GOALS: Target date: 12/13  Household ambulation without AD, without increased pain Baseline: Goal status: INITIAL  2.  Demo hip ext at least 10 deg for normalized gait pattern Baseline:  Goal status: INITIAL    LONG TERM GOALS: Target date: POC date  Lunge to/from floor with UE assist for balance Baseline:  Goal status: INITIAL  2.  Hip abd strength 80% of opposite LE Baseline:  Goal status: INITIAL  3.  LEFS to improve by MDC x2 Baseline:  Goal status: INITIAL  4.  Community ambulation without AD, without antalgic pattern Baseline:  Goal status: INITIAL  5.  5TSTS 12s or less without UE use Baseline:  Goal status: INITIAL    PLAN:  PT FREQUENCY: 1-2x/week  PT DURATION: POC date  PLANNED INTERVENTIONS: 97164- PT Re-evaluation, 97750- Physical Performance Testing, 97110-Therapeutic exercises, 97530- Therapeutic activity, [id number]- Neuromuscular re-education, 97535- Self Care, 02859- Manual therapy, [id number]- Gait training, [id number]- Aquatic Therapy, [phone] (1-2 muscles), 20561 (3+ muscles)- Dry Needling, Patient/Family education, Balance training, Stair training, Taping, Joint mobilization, Spinal mobilization, Scar mobilization, and Cryotherapy.  PLAN FOR NEXT SESSION: quad lengthening, glut strength   Harlene Cordon, PT, DPT 04/20/2024, 4:55 PM

## 2024-04-21 ENCOUNTER — Ambulatory Visit: Admitting: Physician Assistant

## 2024-04-21 DIAGNOSIS — Z96641 Presence of right artificial hip joint: Secondary | ICD-10-CM

## 2024-04-21 NOTE — Progress Notes (Signed)
 Post-Op Visit Note   Patient: Mike Perez           Date of Birth: 1961/03/23           MRN: [id number] Visit Date: 04/21/2024 PCP: Duanne Butler DASEN, MD   Assessment & Plan:  Chief Complaint:  Chief Complaint  Patient presents with   Right Hip - Routine Post Op    R THA- 04/06/2024   Visit Diagnoses:  1. Status post total replacement of right hip     Plan: Patient is a pleasant 63 year old gentleman who comes in today 2 weeks status post right total hip replacement 04/06/2024.  He has been doing well.  He has some discomfort which is relieved with Tylenol  and Robaxin .  He has been compliant taking baby aspirin  twice daily for DVT prophylaxis.  He has been getting outpatient PT and is ambulating with a cane.  Examination of his right hip reveals a well-healed surgical wound.  No evidence of infection or cellulitis.  He does have a very small seroma.  Nontender.  Calves are soft nontender.  He is neurovascularly intact distally.  Today, new Steri-Strips were applied.  He may continue with outpatient PT but I have discussed with him that he may need to back off if he develops discomfort into the groin.  He will continue taking aspirin  twice daily for DVT prophylaxis.  Follow-up in 4 weeks for repeat evaluation and AP pelvis x-rays.  Call with concerns or questions.  Follow-Up Instructions: Return in about 4 weeks (around 05/19/2024).   Orders:  No orders of the defined types were placed in this encounter.  No orders of the defined types were placed in this encounter.   Imaging: No new imaging  PMFS History: Patient Active Problem List   Diagnosis Date Noted   Status post total replacement of right hip 04/06/2024   Ascending aortic aneurysm 02/26/2022   Conductive hearing loss, bilateral 12/07/2021   Bilateral impacted cerumen 12/07/2021   Low testosterone  in male 06/22/2021   Central serous retinopathy 02/07/2021   Primary osteoarthritis of right hip 10/30/2019    Primary osteoarthritis of left hip 10/30/2019   Tendinosis of rotator cuff 10/30/2019   Tendinopathy of right biceps tendon 10/30/2019   Hx of adenomatous polyp of colon 07/08/2014   Prediabetes 04/07/2013   Low back pain    Past Medical History:  Diagnosis Date   Arthritis    bilateral,hands,hips   Ascending aortic aneurysm    monitored yearly thru PCP   Hx of adenomatous polyp of colon 07/08/2014   Hyperlipidemia    Low back pain     Family History  Problem Relation Age of Onset   Diabetes Father    Colon polyps Brother    Alcohol abuse Brother    Hyperlipidemia Brother    Colon cancer Maternal Grandmother    Esophageal cancer Neg Hx    Rectal cancer Neg Hx    Stomach cancer Neg Hx    Crohn's disease Neg Hx     Past Surgical History:  Procedure Laterality Date   COLONOSCOPY  09/17/2003   normal dr Vicci   HERNIA REPAIR     1971   POLYPECTOMY     TOTAL HIP ARTHROPLASTY Right 04/06/2024   Procedure: ARTHROPLASTY, HIP, TOTAL, ANTERIOR APPROACH;  Surgeon: Jerri Kay HERO, MD;  Location: [facility] OR;  Service: Orthopedics;  Laterality: Right;  3-C   Social History   Occupational History   Not on file  Tobacco Use  Smoking status: Never    Passive exposure: Never   Smokeless tobacco: Never  Vaping Use   Vaping status: Never Used  Substance and Sexual Activity   Alcohol use: No    Alcohol/week: 0.0 standard drinks of alcohol   Drug use: No   Sexual activity: Yes    Comment: married.  Works in data processing manager, set designer

## 2024-04-23 ENCOUNTER — Ambulatory Visit (HOSPITAL_BASED_OUTPATIENT_CLINIC_OR_DEPARTMENT_OTHER): Admitting: Physical Therapy

## 2024-04-27 ENCOUNTER — Ambulatory Visit (HOSPITAL_BASED_OUTPATIENT_CLINIC_OR_DEPARTMENT_OTHER): Admitting: Physical Therapy

## 2024-04-27 ENCOUNTER — Encounter (HOSPITAL_BASED_OUTPATIENT_CLINIC_OR_DEPARTMENT_OTHER): Payer: Self-pay | Admitting: Physical Therapy

## 2024-04-27 DIAGNOSIS — M6281 Muscle weakness (generalized): Secondary | ICD-10-CM

## 2024-04-27 DIAGNOSIS — M25551 Pain in right hip: Secondary | ICD-10-CM

## 2024-04-27 DIAGNOSIS — R262 Difficulty in walking, not elsewhere classified: Secondary | ICD-10-CM

## 2024-04-27 NOTE — Therapy (Signed)
 OUTPATIENT PHYSICAL THERAPY EVALUATION   Patient Name: Mike Perez MRN: [id number] DOB:26-Jan-1961, 63 y.o., male Today's Date: 04/27/2024  END OF SESSION:  PT End of Session - 04/27/24 1701     Visit Number 5    Number of Visits 12    Date for Recertification  05/23/24    PT Start Time 1540    PT Stop Time 1622    PT Time Calculation (min) 42 min    Activity Tolerance Patient tolerated treatment well    Behavior During Therapy [facility] for tasks assessed/performed             Past Medical History:  Diagnosis Date   Arthritis    bilateral,hands,hips   Ascending aortic aneurysm    monitored yearly thru PCP   Hx of adenomatous polyp of colon 07/08/2014   Hyperlipidemia    Low back pain    Past Surgical History:  Procedure Laterality Date   COLONOSCOPY  09/17/2003   normal dr Vicci   HERNIA REPAIR     1971   POLYPECTOMY     TOTAL HIP ARTHROPLASTY Right 04/06/2024   Procedure: ARTHROPLASTY, HIP, TOTAL, ANTERIOR APPROACH;  Surgeon: Jerri Kay HERO, MD;  Location: [facility] OR;  Service: Orthopedics;  Laterality: Right;  3-C   Patient Active Problem List   Diagnosis Date Noted   Status post total replacement of right hip 04/06/2024   Ascending aortic aneurysm 02/26/2022   Conductive hearing loss, bilateral 12/07/2021   Bilateral impacted cerumen 12/07/2021   Low testosterone  in male 06/22/2021   Central serous retinopathy 02/07/2021   Primary osteoarthritis of right hip 10/30/2019   Primary osteoarthritis of left hip 10/30/2019   Tendinosis of rotator cuff 10/30/2019   Tendinopathy of right biceps tendon 10/30/2019   Hx of adenomatous polyp of colon 07/08/2014   Prediabetes 04/07/2013   Low back pain     PCP: Butler Burr, MD  REFERRING PROVIDER:  Jerri Kay HERO, MD      REFERRING DIAG:  M16.11 (ICD-10-CM) - Primary osteoarthritis of right hip   Per op note: Operative Findings Severe OA Successful correction of leg length discrepancy   Operative  Procedures  1. Total hip replacement; Right hip; uncemented [id number]   Rationale for Evaluation and Treatment: Rehabilitation  THERAPY DIAG:  Pain in right hip  Difficulty in walking, not elsewhere classified  Muscle weakness (generalized)  ONSET DATE: DOS 04/06/24   SUBJECTIVE:  SUBJECTIVE STATEMENT: At night time I feel the discomfort but fine during the day.   PERTINENT HISTORY:  no  PAIN:  Are you having pain? Yes: NPRS scale: 2/10 Pain location: Rt  lateral and anterior distal thigh Pain description: discomfort Aggravating factors: stretching quads Relieving factors: rest  PRECAUTIONS:  None  RED FLAGS: None   WEIGHT BEARING RESTRICTIONS:  No  FALLS:  Has patient fallen in last 6 months? No  LIVING ENVIRONMENT: Has a basement with stairs  OCCUPATION:  Works on trucks, has to get up/down from floor.  Would like to start going in next week and back to some work after thanksgiving.   PLOF:  Independent  PATIENT GOALS:  Back to working on trucks   OBJECTIVE:  Note: Objective measures were completed at Evaluation unless otherwise noted.   PATIENT SURVEYS:  LEFS  Extreme difficulty/unable (0), Quite a bit of difficulty (1), Moderate difficulty (2), Little difficulty (3), No difficulty (4) Survey date:    Any of your usual work, housework or school activities 2  2. Usual hobbies, recreational or sporting activities   3. Getting into/out of the bath 2  4. Walking between rooms 3  5. Putting on socks/shoes 1  6. Squatting    7. Lifting an object, like a bag of groceries from the floor   8. Performing light activities around your home 2  9. Performing heavy activities around your home   10. Getting into/out of a car 3  11. Walking 2 blocks   12. Walking 1 mile    13. Going up/down 10 stairs (1 flight)   14. Standing for 1 hour   15.  sitting for 1 hour 2  16. Running on even ground   17. Running on uneven ground   18. Making sharp turns while running fast   19. Hopping    20. Rolling over in bed 3  Score total:  18/80     COGNITIVE STATUS: Within functional limits for tasks assessed   SENSATION: WFL  EDEMA:  No  POSTURE:  Eval: trunk flexion upon standing  GAIT: Comments: EVAL  trunk flexion, lacking hip extension on right   Body Part #1 Hip    LOWER EXTREMITY ROM:     Active  Right eval   Hip flexion 90   Hip extension 0   Hip abduction    Hip adduction    Hip internal rotation    Hip external rotation    Knee flexion    Knee extension     (Blank rows = not tested)   TREATMENT DATE:   12/8 Nu step L6 5 min Forwrad step with Lt foot with right glut set for anterior hip opening Supine mod thomas, foot on floor, rolling quads Passive prone quad stretch Supine wide/DF bridge Lunges pull off of counter Gait training with trunk rotation Lunge & half kneel on Rt Lt trunk rotation resisted by blue tband  12/1 Nu step L8 5 min Slant board squats DF & PF Sit<>stand with Lt foot fwd- 10lb kettle bell at chest  [employer] pew, chest press 5 lb kettle bell   04/18/24 Nu step 5 min L4 Seated hip hinge Seated hamstring stretch Sit<>stand pause at hinge, hover and seated hinge Gait- glut activation and reducing forward trunk lean at stance phase Prone alt hip ext Standing lunge slide back at wall- red tband  04/14/24 Nu step L4 4 min Discussed shoe- needs wider BOS for collapsing arch and Rt foot  inversion through stance phase Seated add with LAQ to HS curl Self roller to quads in seated Practiced in/out of bed, in/out of car, dressing Seated hip hinge with ball bw knees Toe scrunches & toe yoga    PATIENT EDUCATION:  Education details: Anatomy of condition, POC, HEP, exercise form/rationale Person  educated: Patient and Spouse Education method: Explanation, Demonstration, Tactile cues, Verbal cues, and Handouts Education comprehension: verbalized understanding, returned demonstration, verbal cues required, tactile cues required, and needs further education   HOME EXERCISE PROGRAM: Access Code: [email or web address] URL: [email or web address] Date: 04/11/2024 Prepared by: Harlene Cordon    ASSESSMENT:  CLINICAL IMPRESSION: Pt has available ROM necessary for hip ext, some tightness in quads but will improve over time. Was able to stand from half kneel without assist today. Will work full floor to stand transfers next appt in order to prepare for full return to work.   REHAB POTENTIAL: Good  CLINICAL DECISION MAKING: Stable/uncomplicated  EVALUATION COMPLEXITY: Low   GOALS: Goals reviewed with patient? Yes  SHORT TERM GOALS: Target date: 12/13  Household ambulation without AD, without increased pain Baseline: Goal status: INITIAL  2.  Demo hip ext at least 10 deg for normalized gait pattern Baseline:  Goal status: INITIAL    LONG TERM GOALS: Target date: POC date  Lunge to/from floor with UE assist for balance Baseline:  Goal status: INITIAL  2.  Hip abd strength 80% of opposite LE Baseline:  Goal status: INITIAL  3.  LEFS to improve by MDC x2 Baseline:  Goal status: INITIAL  4.  Community ambulation without AD, without antalgic pattern Baseline:  Goal status: INITIAL  5.  5TSTS 12s or less without UE use Baseline:  Goal status: INITIAL    PLAN:  PT FREQUENCY: 1-2x/week  PT DURATION: POC date  PLANNED INTERVENTIONS: 97164- PT Re-evaluation, 97750- Physical Performance Testing, 97110-Therapeutic exercises, 97530- Therapeutic activity, [id number]- Neuromuscular re-education, 97535- Self Care, 02859- Manual therapy, [id number]- Gait training, [phone]- Aquatic Therapy, [phone] (1-2 muscles), [phone] (3+ muscles)- Dry Needling, Patient/Family education, Balance  training, Stair training, Taping, Joint mobilization, Spinal mobilization, Scar mobilization, and Cryotherapy.  PLAN FOR NEXT SESSION: quad lengthening, glut strength   Harlene Cordon, PT, DPT 04/27/2024, 5:05 PM

## 2024-04-30 ENCOUNTER — Encounter (HOSPITAL_BASED_OUTPATIENT_CLINIC_OR_DEPARTMENT_OTHER): Admitting: Physical Therapy

## 2024-05-01 ENCOUNTER — Ambulatory Visit (HOSPITAL_BASED_OUTPATIENT_CLINIC_OR_DEPARTMENT_OTHER): Admitting: Physical Therapy

## 2024-05-01 ENCOUNTER — Encounter (HOSPITAL_BASED_OUTPATIENT_CLINIC_OR_DEPARTMENT_OTHER): Payer: Self-pay | Admitting: Physical Therapy

## 2024-05-01 DIAGNOSIS — M25551 Pain in right hip: Secondary | ICD-10-CM

## 2024-05-01 DIAGNOSIS — M6281 Muscle weakness (generalized): Secondary | ICD-10-CM

## 2024-05-01 DIAGNOSIS — R262 Difficulty in walking, not elsewhere classified: Secondary | ICD-10-CM

## 2024-05-01 NOTE — Therapy (Signed)
 OUTPATIENT PHYSICAL THERAPY EVALUATION   Patient Name: Mike Perez MRN: [id number] DOB:[birth year], 63 y.o., male Today's Date: 05/01/2024  END OF SESSION:  PT End of Session - 05/01/24 0933     Visit Number 6    Number of Visits 12    Date for Recertification  05/23/24    PT Start Time 0930    PT Stop Time 1012    PT Time Calculation (min) 42 min    Activity Tolerance Patient tolerated treatment well    Behavior During Therapy [facility] for tasks assessed/performed             Past Medical History:  Diagnosis Date   Arthritis    bilateral,hands,hips   Ascending aortic aneurysm    monitored yearly thru PCP   Hx of adenomatous polyp of colon 07/08/2014   Hyperlipidemia    Low back pain    Past Surgical History:  Procedure Laterality Date   COLONOSCOPY  09/17/2003   normal dr Vicci   HERNIA REPAIR     1971   POLYPECTOMY     TOTAL HIP ARTHROPLASTY Right 04/06/2024   Procedure: ARTHROPLASTY, HIP, TOTAL, ANTERIOR APPROACH;  Surgeon: Jerri Kay HERO, MD;  Location: [facility] OR;  Service: Orthopedics;  Laterality: Right;  3-C   Patient Active Problem List   Diagnosis Date Noted   Status post total replacement of right hip 04/06/2024   Ascending aortic aneurysm 02/26/2022   Conductive hearing loss, bilateral 12/07/2021   Bilateral impacted cerumen 12/07/2021   Low testosterone  in male 06/22/2021   Central serous retinopathy 02/07/2021   Primary osteoarthritis of right hip 10/30/2019   Primary osteoarthritis of left hip 10/30/2019   Tendinosis of rotator cuff 10/30/2019   Tendinopathy of right biceps tendon 10/30/2019   Hx of adenomatous polyp of colon 07/08/2014   Prediabetes 04/07/2013   Low back pain     PCP: Butler Burr, MD  REFERRING PROVIDER:  Jerri Kay HERO, MD      REFERRING DIAG:  M16.11 (ICD-10-CM) - Primary osteoarthritis of right hip   Per op note: Operative Findings Severe OA Successful correction of leg length discrepancy   Operative  Procedures  1. Total hip replacement; Right hip; uncemented [id number]   Rationale for Evaluation and Treatment: Rehabilitation  THERAPY DIAG:  Pain in right hip  Difficulty in walking, not elsewhere classified  Muscle weakness (generalized)  ONSET DATE: DOS 04/06/24   SUBJECTIVE:  SUBJECTIVE STATEMENT: At night time I feel the discomfort but fine during the day.   PERTINENT HISTORY:  no  PAIN:  Are you having pain? Yes: NPRS scale: 2/10 Pain location: Rt  lateral and anterior distal thigh Pain description: discomfort Aggravating factors: stretching quads Relieving factors: rest  PRECAUTIONS:  None  RED FLAGS: None   WEIGHT BEARING RESTRICTIONS:  No  FALLS:  Has patient fallen in last 6 months? No  LIVING ENVIRONMENT: Has a basement with stairs  OCCUPATION:  Works on trucks, has to get up/down from floor.  Would like to start going in next week and back to some work after thanksgiving.   PLOF:  Independent  PATIENT GOALS:  Back to working on trucks   OBJECTIVE:  Note: Objective measures were completed at Evaluation unless otherwise noted.   PATIENT SURVEYS:  LEFS  Extreme difficulty/unable (0), Quite a bit of difficulty (1), Moderate difficulty (2), Little difficulty (3), No difficulty (4) Survey date:    Any of your usual work, housework or school activities 2  2. Usual hobbies, recreational or sporting activities   3. Getting into/out of the bath 2  4. Walking between rooms 3  5. Putting on socks/shoes 1  6. Squatting    7. Lifting an object, like a bag of groceries from the floor   8. Performing light activities around your home 2  9. Performing heavy activities around your home   10. Getting into/out of a car 3  11. Walking 2 blocks   12. Walking 1 mile    13. Going up/down 10 stairs (1 flight)   14. Standing for 1 hour   15.  sitting for 1 hour 2  16. Running on even ground   17. Running on uneven ground   18. Making sharp turns while running fast   19. Hopping    20. Rolling over in bed 3  Score total:  18/80     COGNITIVE STATUS: Within functional limits for tasks assessed   SENSATION: WFL  EDEMA:  No  POSTURE:  Eval: trunk flexion upon standing  GAIT: Comments: EVAL  trunk flexion, lacking hip extension on right   Body Part #1 Hip    LOWER EXTREMITY ROM:     Active  Right eval   Hip flexion 90   Hip extension 0   Hip abduction    Hip adduction    Hip internal rotation    Hip external rotation    Knee flexion    Knee extension     (Blank rows = not tested)   TREATMENT DATE:   12/12 Nu step L7 Ue & LE Up/down from floor TrX: squat, lunge, hip hinge single and double leg Kettle bell dead lift Quadruped: bird dog, rocking, lateral stepping  12/8 Nu step L6 5 min Forwrad step with Lt foot with right glut set for anterior hip opening Supine mod thomas, foot on floor, rolling quads Passive prone quad stretch Supine wide/DF bridge Lunges pull off of counter Gait training with trunk rotation Lunge & half kneel on Rt Lt trunk rotation resisted by blue tband  12/1 Nu step L8 5 min Slant board squats DF & PF Sit<>stand with Lt foot fwd- 10lb kettle bell at chest  [employer] pew, chest press 5 lb kettle bell   04/18/24 Nu step 5 min L4 Seated hip hinge Seated hamstring stretch Sit<>stand pause at hinge, hover and seated hinge Gait- glut activation and reducing forward trunk lean at stance phase  Prone alt hip ext Standing lunge slide back at wall- red tband  04/14/24 Nu step L4 4 min Discussed shoe- needs wider BOS for collapsing arch and Rt foot inversion through stance phase Seated add with LAQ to HS curl Self roller to quads in seated Practiced in/out of bed, in/out of car,  dressing Seated hip hinge with ball bw knees Toe scrunches & toe yoga    PATIENT EDUCATION:  Education details: Anatomy of condition, POC, HEP, exercise form/rationale Person educated: Patient and Spouse Education method: Explanation, Demonstration, Tactile cues, Verbal cues, and Handouts Education comprehension: verbalized understanding, returned demonstration, verbal cues required, tactile cues required, and needs further education   HOME EXERCISE PROGRAM: Access Code: [email or web address] URL: [email or web address] Date: 04/11/2024 Prepared by: Harlene Cordon    ASSESSMENT:  CLINICAL IMPRESSION: Able to perform floor<>stand transfers with minimal difficulty, no pain, and without external assist for balance. Will benefit from gross strength increase for ADLs and return to work.   REHAB POTENTIAL: Good  CLINICAL DECISION MAKING: Stable/uncomplicated  EVALUATION COMPLEXITY: Low   GOALS: Goals reviewed with patient? Yes  SHORT TERM GOALS: Target date: 12/13  Household ambulation without AD, without increased pain Baseline: Goal status: MET  2.  Demo hip ext at least 10 deg for normalized gait pattern Baseline:  Goal status: MET    LONG TERM GOALS: Target date: POC date  Lunge to/from floor with UE assist for balance Baseline:  Goal status: INITIAL  2.  Hip abd strength 80% of opposite LE Baseline:  Goal status: INITIAL  3.  LEFS to improve by MDC x2 Baseline:  Goal status: INITIAL  4.  Community ambulation without AD, without antalgic pattern Baseline:  Goal status: INITIAL  5.  5TSTS 12s or less without UE use Baseline:  Goal status: INITIAL    PLAN:  PT FREQUENCY: 1-2x/week  PT DURATION: POC date  PLANNED INTERVENTIONS: 97164- PT Re-evaluation, 97750- Physical Performance Testing, 97110-Therapeutic exercises, 97530- Therapeutic activity, [id number]- Neuromuscular re-education, 97535- Self Care, 02859- Manual therapy, [id number]- Gait training,  [phone]- Aquatic Therapy, [phone] (1-2 muscles), 20561 (3+ muscles)- Dry Needling, Patient/Family education, Balance training, Stair training, Taping, Joint mobilization, Spinal mobilization, Scar mobilization, and Cryotherapy.  PLAN FOR NEXT SESSION: quad lengthening, glut strength   Harlene Cordon, PT, DPT 05/01/2024, 10:14 AM

## 2024-05-04 ENCOUNTER — Ambulatory Visit (HOSPITAL_BASED_OUTPATIENT_CLINIC_OR_DEPARTMENT_OTHER): Admitting: Physical Therapy

## 2024-05-04 ENCOUNTER — Encounter (HOSPITAL_BASED_OUTPATIENT_CLINIC_OR_DEPARTMENT_OTHER): Payer: Self-pay | Admitting: Physical Therapy

## 2024-05-04 DIAGNOSIS — M25551 Pain in right hip: Secondary | ICD-10-CM | POA: Diagnosis not present

## 2024-05-04 DIAGNOSIS — R262 Difficulty in walking, not elsewhere classified: Secondary | ICD-10-CM

## 2024-05-04 DIAGNOSIS — M6281 Muscle weakness (generalized): Secondary | ICD-10-CM

## 2024-05-04 NOTE — Therapy (Signed)
 OUTPATIENT PHYSICAL THERAPY EVALUATION   Patient Name: Mike Perez MRN: [id number] DOB:[birth year], 62 y.o., male Today's Date: 05/04/2024  END OF SESSION:  PT End of Session - 05/04/24 1427     Visit Number 7    Number of Visits 12    Date for Recertification  05/23/24    PT Start Time 1420    PT Stop Time 1500    PT Time Calculation (min) 40 min    Activity Tolerance Patient tolerated treatment well    Behavior During Therapy [facility] for tasks assessed/performed             Past Medical History:  Diagnosis Date   Arthritis    bilateral,hands,hips   Ascending aortic aneurysm    monitored yearly thru PCP   Hx of adenomatous polyp of colon 07/08/2014   Hyperlipidemia    Low back pain    Past Surgical History:  Procedure Laterality Date   COLONOSCOPY  09/17/2003   normal dr Vicci   HERNIA REPAIR     1971   POLYPECTOMY     TOTAL HIP ARTHROPLASTY Right 04/06/2024   Procedure: ARTHROPLASTY, HIP, TOTAL, ANTERIOR APPROACH;  Surgeon: Jerri Kay HERO, MD;  Location: [facility] OR;  Service: Orthopedics;  Laterality: Right;  3-C   Patient Active Problem List   Diagnosis Date Noted   Status post total replacement of right hip 04/06/2024   Ascending aortic aneurysm 02/26/2022   Conductive hearing loss, bilateral 12/07/2021   Bilateral impacted cerumen 12/07/2021   Low testosterone  in male 06/22/2021   Central serous retinopathy 02/07/2021   Primary osteoarthritis of right hip 10/30/2019   Primary osteoarthritis of left hip 10/30/2019   Tendinosis of rotator cuff 10/30/2019   Tendinopathy of right biceps tendon 10/30/2019   Hx of adenomatous polyp of colon 07/08/2014   Prediabetes 04/07/2013   Low back pain     PCP: Butler Burr, MD  REFERRING PROVIDER:  Jerri Kay HERO, MD      REFERRING DIAG:  M16.11 (ICD-10-CM) - Primary osteoarthritis of right hip   Per op note: Operative Findings Severe OA Successful correction of leg length discrepancy   Operative  Procedures  1. Total hip replacement; Right hip; uncemented [id number]   Rationale for Evaluation and Treatment: Rehabilitation  THERAPY DIAG:  Pain in right hip  Difficulty in walking, not elsewhere classified  Muscle weakness (generalized)  ONSET DATE: DOS 04/06/24   SUBJECTIVE:  SUBJECTIVE STATEMENT: It is doing great! I did a little more around the shop.   PERTINENT HISTORY:  no  PAIN:  Are you having pain? Yes: NPRS scale: 2/10 Pain location: Rt  lateral and anterior distal thigh Pain description: discomfort Aggravating factors: stretching quads Relieving factors: rest  PRECAUTIONS:  None  RED FLAGS: None   WEIGHT BEARING RESTRICTIONS:  No  FALLS:  Has patient fallen in last 6 months? No  LIVING ENVIRONMENT: Has a basement with stairs  OCCUPATION:  Works on trucks, has to get up/down from floor.  Would like to start going in next week and back to some work after thanksgiving.   PLOF:  Independent  PATIENT GOALS:  Back to working on trucks   OBJECTIVE:  Note: Objective measures were completed at Evaluation unless otherwise noted.   PATIENT SURVEYS:  LEFS  Extreme difficulty/unable (0), Quite a bit of difficulty (1), Moderate difficulty (2), Little difficulty (3), No difficulty (4) Survey date:    Any of your usual work, housework or school activities 2  2. Usual hobbies, recreational or sporting activities   3. Getting into/out of the bath 2  4. Walking between rooms 3  5. Putting on socks/shoes 1  6. Squatting    7. Lifting an object, like a bag of groceries from the floor   8. Performing light activities around your home 2  9. Performing heavy activities around your home   10. Getting into/out of a car 3  11. Walking 2 blocks   12. Walking 1 mile   13.  Going up/down 10 stairs (1 flight)   14. Standing for 1 hour   15.  sitting for 1 hour 2  16. Running on even ground   17. Running on uneven ground   18. Making sharp turns while running fast   19. Hopping    20. Rolling over in bed 3  Score total:  18/80     COGNITIVE STATUS: Within functional limits for tasks assessed   SENSATION: WFL  EDEMA:  No  POSTURE:  Eval: trunk flexion upon standing  GAIT: Comments: EVAL  trunk flexion, lacking hip extension on right   Body Part #1 Hip    LOWER EXTREMITY ROM:     Active  Right eval   Hip flexion 90   Hip extension 0   Hip abduction    Hip adduction    Hip internal rotation    Hip external rotation    Knee flexion    Knee extension     (Blank rows = not tested)   TREATMENT DATE:   05/04/24 Nu step 5 min L9 UE + LE Shuttle 4*25 narrow & wide leg press Hip hinge- single and double leg, +10lb kettle bell Half kneel kettlebell pick up Half kneel lunge forward with Rt foot forward Sit<>stand Rt foot on airex- Lt gentle support on toes Rt SLS on airex with lateral taps of Lt foot Sidelying clam, + reverse clam  12/12 Nu step L7 Ue & LE Up/down from floor TrX: squat, lunge, hip hinge single and double leg Kettle bell dead lift Quadruped: bird dog, rocking, lateral stepping  12/8 Nu step L6 5 min Forwrad step with Lt foot with right glut set for anterior hip opening Supine mod thomas, foot on floor, rolling quads Passive prone quad stretch Supine wide/DF bridge Lunges pull off of counter Gait training with trunk rotation Lunge & half kneel on Rt Lt trunk rotation resisted by blue tband  12/1  Nu step L8 5 min Slant board squats DF & PF Sit<>stand with Lt foot fwd- 10lb kettle bell at chest  [employer] pew, chest press 5 lb kettle bell   04/18/24 Nu step 5 min L4 Seated hip hinge Seated hamstring stretch Sit<>stand pause at hinge, hover and seated hinge Gait- glut activation and reducing forward  trunk lean at stance phase Prone alt hip ext Standing lunge slide back at wall- red tband  04/14/24 Nu step L4 4 min Discussed shoe- needs wider BOS for collapsing arch and Rt foot inversion through stance phase Seated add with LAQ to HS curl Self roller to quads in seated Practiced in/out of bed, in/out of car, dressing Seated hip hinge with ball bw knees Toe scrunches & toe yoga    PATIENT EDUCATION:  Education details: Anatomy of condition, POC, HEP, exercise form/rationale Person educated: Patient and Spouse Education method: Explanation, Demonstration, Tactile cues, Verbal cues, and Handouts Education comprehension: verbalized understanding, returned demonstration, verbal cues required, tactile cues required, and needs further education   HOME EXERCISE PROGRAM: Access Code: [email or web address] URL: [email or web address] Date: 04/11/2024 Prepared by: Harlene Cordon    ASSESSMENT:  CLINICAL IMPRESSION: Pt Is progressing very well and is able to independently stand from the floor safely and with good control. Requested that he try more activities at work to ensure he is prepared for full return.   REHAB POTENTIAL: Good  CLINICAL DECISION MAKING: Stable/uncomplicated  EVALUATION COMPLEXITY: Low   GOALS: Goals reviewed with patient? Yes  SHORT TERM GOALS: Target date: 12/13  Household ambulation without AD, without increased pain Baseline: Goal status: MET  2.  Demo hip ext at least 10 deg for normalized gait pattern Baseline:  Goal status: MET    LONG TERM GOALS: Target date: POC date  Lunge to/from floor with UE assist for balance Baseline:  Goal status: INITIAL  2.  Hip abd strength 80% of opposite LE Baseline:  Goal status: INITIAL  3.  LEFS to improve by MDC x2 Baseline:  Goal status: INITIAL  4.  Community ambulation without AD, without antalgic pattern Baseline:  Goal status: INITIAL  5.  5TSTS 12s or less without UE  use Baseline:  Goal status: INITIAL    PLAN:  PT FREQUENCY: 1-2x/week  PT DURATION: POC date  PLANNED INTERVENTIONS: 97164- PT Re-evaluation, 97750- Physical Performance Testing, 97110-Therapeutic exercises, 97530- Therapeutic activity, [id number]- Neuromuscular re-education, 97535- Self Care, 02859- Manual therapy, [id number]- Gait training, [phone]- Aquatic Therapy, [phone] (1-2 muscles), 20561 (3+ muscles)- Dry Needling, Patient/Family education, Balance training, Stair training, Taping, Joint mobilization, Spinal mobilization, Scar mobilization, and Cryotherapy.  PLAN FOR NEXT SESSION: quad lengthening, glut strength   Harlene Cordon, PT, DPT 05/04/2024, 3:17 PM

## 2024-05-07 ENCOUNTER — Encounter (HOSPITAL_BASED_OUTPATIENT_CLINIC_OR_DEPARTMENT_OTHER): Payer: Self-pay | Admitting: Physical Therapy

## 2024-05-07 ENCOUNTER — Ambulatory Visit (HOSPITAL_BASED_OUTPATIENT_CLINIC_OR_DEPARTMENT_OTHER): Admitting: Physical Therapy

## 2024-05-07 DIAGNOSIS — M25551 Pain in right hip: Secondary | ICD-10-CM | POA: Diagnosis not present

## 2024-05-07 DIAGNOSIS — M6281 Muscle weakness (generalized): Secondary | ICD-10-CM

## 2024-05-07 DIAGNOSIS — R262 Difficulty in walking, not elsewhere classified: Secondary | ICD-10-CM

## 2024-05-07 NOTE — Therapy (Signed)
 OUTPATIENT PHYSICAL THERAPY EVALUATION   Patient Name: Mike Perez MRN: [id number] DOB:05-08-1961, 63 y.o., male Today's Date: 05/07/2024  END OF SESSION:  PT End of Session - 05/07/24 1102     Visit Number 8    Number of Visits 12    Date for Recertification  05/23/24    PT Start Time 1100    PT Stop Time 1143    PT Time Calculation (min) 43 min    Activity Tolerance Patient tolerated treatment well    Behavior During Therapy [facility] for tasks assessed/performed             Past Medical History:  Diagnosis Date   Arthritis    bilateral,hands,hips   Ascending aortic aneurysm    monitored yearly thru PCP   Hx of adenomatous polyp of colon 07/08/2014   Hyperlipidemia    Low back pain    Past Surgical History:  Procedure Laterality Date   COLONOSCOPY  09/17/2003   normal dr Vicci   HERNIA REPAIR     1971   POLYPECTOMY     TOTAL HIP ARTHROPLASTY Right 04/06/2024   Procedure: ARTHROPLASTY, HIP, TOTAL, ANTERIOR APPROACH;  Surgeon: Jerri Kay HERO, MD;  Location: [facility] OR;  Service: Orthopedics;  Laterality: Right;  3-C   Patient Active Problem List   Diagnosis Date Noted   Status post total replacement of right hip 04/06/2024   Ascending aortic aneurysm 02/26/2022   Conductive hearing loss, bilateral 12/07/2021   Bilateral impacted cerumen 12/07/2021   Low testosterone  in male 06/22/2021   Central serous retinopathy 02/07/2021   Primary osteoarthritis of right hip 10/30/2019   Primary osteoarthritis of left hip 10/30/2019   Tendinosis of rotator cuff 10/30/2019   Tendinopathy of right biceps tendon 10/30/2019   Hx of adenomatous polyp of colon 07/08/2014   Prediabetes 04/07/2013   Low back pain     PCP: Butler Burr, MD  REFERRING PROVIDER:  Jerri Kay HERO, MD      REFERRING DIAG:  M16.11 (ICD-10-CM) - Primary osteoarthritis of right hip   Per op note: Operative Findings Severe OA Successful correction of leg length discrepancy   Operative  Procedures  1. Total hip replacement; Right hip; uncemented [id number]   Rationale for Evaluation and Treatment: Rehabilitation  THERAPY DIAG:  Pain in right hip  Difficulty in walking, not elsewhere classified  Muscle weakness (generalized)  ONSET DATE: DOS 04/06/24   SUBJECTIVE:  SUBJECTIVE STATEMENT: Still taking extended release tylenol  and muscle relaxers. Overall no problems walked from [employer] to [employer] as was ok.   PERTINENT HISTORY:  no  PAIN:  Are you having pain? Yes: NPRS scale: 0/10 Pain location: Rt  lateral and anterior distal thigh Pain description: discomfort Aggravating factors: stretching quads Relieving factors: rest  PRECAUTIONS:  None  RED FLAGS: None   WEIGHT BEARING RESTRICTIONS:  No  FALLS:  Has patient fallen in last 6 months? No  LIVING ENVIRONMENT: Has a basement with stairs  OCCUPATION:  Works on trucks, has to get up/down from floor.  Would like to start going in next week and back to some work after thanksgiving.   PLOF:  Independent  PATIENT GOALS:  Back to working on trucks   OBJECTIVE:  Note: Objective measures were completed at Evaluation unless otherwise noted.   PATIENT SURVEYS:  LEFS  Extreme difficulty/unable (0), Quite a bit of difficulty (1), Moderate difficulty (2), Little difficulty (3), No difficulty (4) Survey date:  eval 12/18  Any of your usual work, housework or school activities 2 3  2. Usual hobbies, recreational or sporting activities  4  3. Getting into/out of the bath 2 4  4. Walking between rooms 3 4  5. Putting on socks/shoes 1 3  6. Squatting   2  7. Lifting an object, like a bag of groceries from the floor  4  8. Performing light activities around your home 2 4  9. Performing heavy activities around your  home  4  10. Getting into/out of a car 3 4  11. Walking 2 blocks  4  12. Walking 1 mile  4  13. Going up/down 10 stairs (1 flight)  4  14. Standing for 1 hour  4  15.  sitting for 1 hour 2 4  16. Running on even ground  0  17. Running on uneven ground  0  18. Making sharp turns while running fast  0  19. Hopping   4  20. Rolling over in bed 3 4  Score total:  18/80 60/80     COGNITIVE STATUS: Within functional limits for tasks assessed   SENSATION: WFL  EDEMA:  No  POSTURE:  Eval: trunk flexion upon standing  GAIT: Comments: EVAL  trunk flexion, lacking hip extension on right   Body Part #1 Hip    LOWER EXTREMITY ROM:     Active  Right eval   Hip flexion 90   Hip extension 0   Hip abduction    Hip adduction    Hip internal rotation    Hip external rotation    Knee flexion    Knee extension     (Blank rows = not tested)   TREATMENT DATE:   05/07/24 Lateral step down Heel raises, ball bw ankles Squat tap chair Wall squat SLS on airex with head turns Low side steps over airex Seated hinge- specific to donning socks/shoes  05/04/24 Nu step 5 min L9 UE + LE Shuttle 4*25 narrow & wide leg press Hip hinge- single and double leg, +10lb kettle bell Half kneel kettlebell pick up Half kneel lunge forward with Rt foot forward Sit<>stand Rt foot on airex- Lt gentle support on toes Rt SLS on airex with lateral taps of Lt foot Sidelying clam, + reverse clam  12/12 Nu step L7 Ue & LE Up/down from floor TrX: squat, lunge, hip hinge single and double leg Kettle bell dead lift Quadruped: bird dog, rocking,  lateral stepping  12/8 Nu step L6 5 min Forwrad step with Lt foot with right glut set for anterior hip opening Supine mod thomas, foot on floor, rolling quads Passive prone quad stretch Supine wide/DF bridge Lunges pull off of counter Gait training with trunk rotation Lunge & half kneel on Rt Lt trunk rotation resisted by blue tband  12/1 Nu step  L8 5 min Slant board squats DF & PF Sit<>stand with Lt foot fwd- 10lb kettle bell at chest  Airex [employer] pew, chest press 5 lb kettle bell   04/18/24 Nu step 5 min L4 Seated hip hinge Seated hamstring stretch Sit<>stand pause at hinge, hover and seated hinge Gait- glut activation and reducing forward trunk lean at stance phase Prone alt hip ext Standing lunge slide back at wall- red tband  04/14/24 Nu step L4 4 min Discussed shoe- needs wider BOS for collapsing arch and Rt foot inversion through stance phase Seated add with LAQ to HS curl Self roller to quads in seated Practiced in/out of bed, in/out of car, dressing Seated hip hinge with ball bw knees Toe scrunches & toe yoga    PATIENT EDUCATION:  Education details: Anatomy of condition, POC, HEP, exercise form/rationale Person educated: Patient and Spouse Education method: Explanation, Demonstration, Tactile cues, Verbal cues, and Handouts Education comprehension: verbalized understanding, returned demonstration, verbal cues required, tactile cues required, and needs further education   HOME EXERCISE PROGRAM: Access Code: [email or web address] URL: [email or web address] Date: 04/11/2024 Prepared by: Harlene Cordon    ASSESSMENT:  CLINICAL IMPRESSION: Able to reach shoe fully when hip hinge was demonstrated correctly vs overuse of lumbar lordosis for reach.  REHAB POTENTIAL: Good  CLINICAL DECISION MAKING: Stable/uncomplicated  EVALUATION COMPLEXITY: Low   GOALS: Goals reviewed with patient? Yes  SHORT TERM GOALS: Target date: 12/13  Household ambulation without AD, without increased pain Baseline: Goal status: MET  2.  Demo hip ext at least 10 deg for normalized gait pattern Baseline:  Goal status: MET    LONG TERM GOALS: Target date: POC date  Lunge to/from floor with UE assist for balance Baseline:  Goal status: INITIAL  2.  Hip abd strength 80% of opposite LE Baseline:  Goal  status: INITIAL  3.  LEFS to improve by MDC x2 Baseline:  Goal status: INITIAL  4.  Community ambulation without AD, without antalgic pattern Baseline:  Goal status: INITIAL  5.  5TSTS 12s or less without UE use Baseline:  Goal status: INITIAL    PLAN:  PT FREQUENCY: 1-2x/week  PT DURATION: POC date  PLANNED INTERVENTIONS: 97164- PT Re-evaluation, 97750- Physical Performance Testing, 97110-Therapeutic exercises, 97530- Therapeutic activity, [id number]- Neuromuscular re-education, 97535- Self Care, [id number]- Manual therapy, [id number]- Gait training, [phone]- Aquatic Therapy, [phone] (1-2 muscles), 20561 (3+ muscles)- Dry Needling, Patient/Family education, Balance training, Stair training, Taping, Joint mobilization, Spinal mobilization, Scar mobilization, and Cryotherapy.  PLAN FOR NEXT SESSION: quad lengthening, glut strength   Harlene Cordon, PT, DPT 05/07/2024, 11:46 AM

## 2024-05-12 ENCOUNTER — Encounter (HOSPITAL_BASED_OUTPATIENT_CLINIC_OR_DEPARTMENT_OTHER): Payer: Self-pay | Admitting: Physical Therapy

## 2024-05-12 ENCOUNTER — Ambulatory Visit (HOSPITAL_BASED_OUTPATIENT_CLINIC_OR_DEPARTMENT_OTHER): Admitting: Physical Therapy

## 2024-05-12 DIAGNOSIS — R262 Difficulty in walking, not elsewhere classified: Secondary | ICD-10-CM

## 2024-05-12 DIAGNOSIS — M6281 Muscle weakness (generalized): Secondary | ICD-10-CM

## 2024-05-12 DIAGNOSIS — M25551 Pain in right hip: Secondary | ICD-10-CM | POA: Diagnosis not present

## 2024-05-12 NOTE — Therapy (Signed)
 " OUTPATIENT PHYSICAL THERAPY EVALUATION   Patient Name: Mike Perez MRN: [id number] DOB:10/23/1960, 63 y.o., male Today's Date: 05/12/2024  END OF SESSION:  PT End of Session - 05/12/24 1021     Visit Number 9    Number of Visits 12    Date for Recertification  05/23/24    PT Start Time 1018    PT Stop Time 1058    PT Time Calculation (min) 40 min    Activity Tolerance Patient tolerated treatment well    Behavior During Therapy [facility] for tasks assessed/performed             Past Medical History:  Diagnosis Date   Arthritis    bilateral,hands,hips   Ascending aortic aneurysm    monitored yearly thru PCP   Hx of adenomatous polyp of colon 07/08/2014   Hyperlipidemia    Low back pain    Past Surgical History:  Procedure Laterality Date   COLONOSCOPY  09/17/2003   normal dr Vicci   HERNIA REPAIR     1971   POLYPECTOMY     TOTAL HIP ARTHROPLASTY Right 04/06/2024   Procedure: ARTHROPLASTY, HIP, TOTAL, ANTERIOR APPROACH;  Surgeon: Jerri Kay HERO, MD;  Location: [facility] OR;  Service: Orthopedics;  Laterality: Right;  3-C   Patient Active Problem List   Diagnosis Date Noted   Status post total replacement of right hip 04/06/2024   Ascending aortic aneurysm 02/26/2022   Conductive hearing loss, bilateral 12/07/2021   Bilateral impacted cerumen 12/07/2021   Low testosterone  in male 06/22/2021   Central serous retinopathy 02/07/2021   Primary osteoarthritis of right hip 10/30/2019   Primary osteoarthritis of left hip 10/30/2019   Tendinosis of rotator cuff 10/30/2019   Tendinopathy of right biceps tendon 10/30/2019   Hx of adenomatous polyp of colon 07/08/2014   Prediabetes 04/07/2013   Low back pain     PCP: Butler Burr, MD  REFERRING PROVIDER:  Jerri Kay HERO, MD      REFERRING DIAG:  M16.11 (ICD-10-CM) - Primary osteoarthritis of right hip   Per op note: Operative Findings Severe OA Successful correction of leg length discrepancy   Operative  Procedures  1. Total hip replacement; Right hip; uncemented [id number]   Rationale for Evaluation and Treatment: Rehabilitation  THERAPY DIAG:  Pain in right hip  Difficulty in walking, not elsewhere classified  Muscle weakness (generalized)  ONSET DATE: DOS 04/06/24   SUBJECTIVE:  SUBJECTIVE STATEMENT: Dropped to 1 muscle relaxer and 1 extended release tylenol  since last appt. Overall it feels really good. Sometimes I forget that I have a replacement.   PERTINENT HISTORY:  no  PAIN:  Are you having pain? Yes: NPRS scale: 0/10 Pain location: Rt  lateral and anterior distal thigh Pain description: discomfort Aggravating factors: stretching quads Relieving factors: rest  PRECAUTIONS:  None  RED FLAGS: None   WEIGHT BEARING RESTRICTIONS:  No  FALLS:  Has patient fallen in last 6 months? No  LIVING ENVIRONMENT: Has a basement with stairs  OCCUPATION:  Works on trucks, has to get up/down from floor.  Would like to start going in next week and back to some work after thanksgiving.   PLOF:  Independent  PATIENT GOALS:  Back to working on trucks   OBJECTIVE:  Note: Objective measures were completed at Evaluation unless otherwise noted.   PATIENT SURVEYS:  LEFS  Extreme difficulty/unable (0), Quite a bit of difficulty (1), Moderate difficulty (2), Little difficulty (3), No difficulty (4) Survey date:  eval 12/18  Any of your usual work, housework or school activities 2 3  2. Usual hobbies, recreational or sporting activities  4  3. Getting into/out of the bath 2 4  4. Walking between rooms 3 4  5. Putting on socks/shoes 1 3  6. Squatting   2  7. Lifting an object, like a bag of groceries from the floor  4  8. Performing light activities around your home 2 4  9. Performing  heavy activities around your home  4  10. Getting into/out of a car 3 4  11. Walking 2 blocks  4  12. Walking 1 mile  4  13. Going up/down 10 stairs (1 flight)  4  14. Standing for 1 hour  4  15.  sitting for 1 hour 2 4  16. Running on even ground  0  17. Running on uneven ground  0  18. Making sharp turns while running fast  0  19. Hopping   4  20. Rolling over in bed 3 4  Score total:  18/80 60/80     COGNITIVE STATUS: Within functional limits for tasks assessed   SENSATION: WFL  EDEMA:  No  POSTURE:  Eval: trunk flexion upon standing  GAIT: Comments: EVAL  trunk flexion, lacking hip extension on right   Body Part #1 Hip    LOWER EXTREMITY ROM:     Active  Right eval   Hip flexion 90   Hip extension 0   Hip abduction    Hip adduction    Hip internal rotation    Hip external rotation    Knee flexion    Knee extension     (Blank rows = not tested)   TREATMENT DATE:   05/12/24 STM Lt post hip Clams with slow lower Seated hip hinge- deep breath at end range 6 fwd and lateral step up Chair tap & band pull Lateral step up airex Knee flx+IR+AP capsule mobs Qped rock back with hip IR for capsule stretch, neutral for lumbar stretch Incision mobilization   05/07/24 Lateral step down Heel raises, ball bw ankles Squat tap chair Wall squat SLS on airex with head turns Low side steps over airex Seated hinge- specific to donning socks/shoes  05/04/24 Nu step 5 min L9 UE + LE Shuttle 4*25 narrow & wide leg press Hip hinge- single and double leg, +10lb kettle bell Half kneel kettlebell pick up Half kneel  lunge forward with Rt foot forward Sit<>stand Rt foot on airex- Lt gentle support on toes Rt SLS on airex with lateral taps of Lt foot Sidelying clam, + reverse clam  12/12 Nu step L7 Ue & LE Up/down from floor TrX: squat, lunge, hip hinge single and double leg Kettle bell dead lift Quadruped: bird dog, rocking, lateral stepping  12/8 Nu  step L6 5 min Forwrad step with Lt foot with right glut set for anterior hip opening Supine mod thomas, foot on floor, rolling quads Passive prone quad stretch Supine wide/DF bridge Lunges pull off of counter Gait training with trunk rotation Lunge & half kneel on Rt Lt trunk rotation resisted by blue tband  12/1 Nu step L8 5 min Slant board squats DF & PF Sit<>stand with Lt foot fwd- 10lb kettle bell at chest  Airex [employer] pew, chest press 5 lb kettle bell   04/18/24 Nu step 5 min L4 Seated hip hinge Seated hamstring stretch Sit<>stand pause at hinge, hover and seated hinge Gait- glut activation and reducing forward trunk lean at stance phase Prone alt hip ext Standing lunge slide back at wall- red tband  04/14/24 Nu step L4 4 min Discussed shoe- needs wider BOS for collapsing arch and Rt foot inversion through stance phase Seated add with LAQ to HS curl Self roller to quads in seated Practiced in/out of bed, in/out of car, dressing Seated hip hinge with ball bw knees Toe scrunches & toe yoga    PATIENT EDUCATION:  Education details: Anatomy of condition, POC, HEP, exercise form/rationale Person educated: Patient and Spouse Education method: Explanation, Demonstration, Tactile cues, Verbal cues, and Handouts Education comprehension: verbalized understanding, returned demonstration, verbal cues required, tactile cues required, and needs further education   HOME EXERCISE PROGRAM: Access Code: [email or web address] URL: [email or web address] Date: 04/11/2024 Prepared by: Harlene Cordon    ASSESSMENT:  CLINICAL IMPRESSION: Focused strengthening on achieving full hip extension through glut activation rather than pausing just shy as was his habit prior to surgery. Tightness in bilat post hip capsule addressed with manual mobilizations.   REHAB POTENTIAL: Good  CLINICAL DECISION MAKING: Stable/uncomplicated  EVALUATION COMPLEXITY: Low   GOALS: Goals  reviewed with patient? Yes  SHORT TERM GOALS: Target date: 12/13  Household ambulation without AD, without increased pain Baseline: Goal status: MET  2.  Demo hip ext at least 10 deg for normalized gait pattern Baseline:  Goal status: MET    LONG TERM GOALS: Target date: POC date  Lunge to/from floor with UE assist for balance Baseline:  Goal status: INITIAL  2.  Hip abd strength 80% of opposite LE Baseline:  Goal status: INITIAL  3.  LEFS to improve by MDC x2 Baseline:  Goal status: INITIAL  4.  Community ambulation without AD, without antalgic pattern Baseline:  Goal status: INITIAL  5.  5TSTS 12s or less without UE use Baseline:  Goal status: INITIAL    PLAN:  PT FREQUENCY: 1-2x/week  PT DURATION: POC date  PLANNED INTERVENTIONS: 97164- PT Re-evaluation, 97750- Physical Performance Testing, 97110-Therapeutic exercises, 97530- Therapeutic activity, [id number]- Neuromuscular re-education, 97535- Self Care, 02859- Manual therapy, [id number]- Gait training, [phone]- Aquatic Therapy, [phone] (1-2 muscles), 20561 (3+ muscles)- Dry Needling, Patient/Family education, Balance training, Stair training, Taping, Joint mobilization, Spinal mobilization, Scar mobilization, and Cryotherapy.  PLAN FOR NEXT SESSION: quad lengthening, glut strength   Harlene Cordon, PT, DPT 05/12/2024, 1:15 PM  "

## 2024-05-16 ENCOUNTER — Ambulatory Visit (HOSPITAL_BASED_OUTPATIENT_CLINIC_OR_DEPARTMENT_OTHER): Admitting: Physical Therapy

## 2024-05-18 ENCOUNTER — Ambulatory Visit (HOSPITAL_BASED_OUTPATIENT_CLINIC_OR_DEPARTMENT_OTHER): Admitting: Physical Therapy

## 2024-05-18 ENCOUNTER — Telehealth (HOSPITAL_BASED_OUTPATIENT_CLINIC_OR_DEPARTMENT_OTHER): Payer: Self-pay | Admitting: Physical Therapy

## 2024-05-18 NOTE — Telephone Encounter (Signed)
 Spoke with pt's wife. She states that she will call back to reschedule today's appt after looking at her calendar.

## 2024-05-20 ENCOUNTER — Encounter (HOSPITAL_BASED_OUTPATIENT_CLINIC_OR_DEPARTMENT_OTHER): Payer: Self-pay | Admitting: Physical Therapy

## 2024-05-20 ENCOUNTER — Ambulatory Visit (INDEPENDENT_AMBULATORY_CARE_PROVIDER_SITE_OTHER): Admitting: Physician Assistant

## 2024-05-20 ENCOUNTER — Other Ambulatory Visit: Payer: Self-pay

## 2024-05-20 ENCOUNTER — Ambulatory Visit (HOSPITAL_BASED_OUTPATIENT_CLINIC_OR_DEPARTMENT_OTHER): Admitting: Physical Therapy

## 2024-05-20 DIAGNOSIS — M6281 Muscle weakness (generalized): Secondary | ICD-10-CM

## 2024-05-20 DIAGNOSIS — R262 Difficulty in walking, not elsewhere classified: Secondary | ICD-10-CM

## 2024-05-20 DIAGNOSIS — M25551 Pain in right hip: Secondary | ICD-10-CM | POA: Diagnosis not present

## 2024-05-20 DIAGNOSIS — Z96641 Presence of right artificial hip joint: Secondary | ICD-10-CM

## 2024-05-20 NOTE — Therapy (Signed)
 " OUTPATIENT PHYSICAL THERAPY DISCHARGE   Patient Name: Mike Perez MRN: [id number] DOB:02-Jul-1960, 63 y.o., male Today's Date: 05/20/2024  END OF SESSION:  PT End of Session - 05/20/24 0851     Visit Number 10    Number of Visits 12    Date for Recertification  05/23/24    PT Start Time 0847    PT Stop Time 0910    PT Time Calculation (min) 23 min    Activity Tolerance Patient tolerated treatment well    Behavior During Therapy [facility] for tasks assessed/performed             Past Medical History:  Diagnosis Date   Arthritis    bilateral,hands,hips   Ascending aortic aneurysm    monitored yearly thru PCP   Hx of adenomatous polyp of colon 07/08/2014   Hyperlipidemia    Low back pain    Past Surgical History:  Procedure Laterality Date   COLONOSCOPY  09/17/2003   normal dr Vicci   HERNIA REPAIR     1971   POLYPECTOMY     TOTAL HIP ARTHROPLASTY Right 04/06/2024   Procedure: ARTHROPLASTY, HIP, TOTAL, ANTERIOR APPROACH;  Surgeon: Jerri Kay HERO, MD;  Location: [facility] OR;  Service: Orthopedics;  Laterality: Right;  3-C   Patient Active Problem List   Diagnosis Date Noted   Status post total replacement of right hip 04/06/2024   Ascending aortic aneurysm 02/26/2022   Conductive hearing loss, bilateral 12/07/2021   Bilateral impacted cerumen 12/07/2021   Low testosterone  in male 06/22/2021   Central serous retinopathy 02/07/2021   Primary osteoarthritis of right hip 10/30/2019   Primary osteoarthritis of left hip 10/30/2019   Tendinosis of rotator cuff 10/30/2019   Tendinopathy of right biceps tendon 10/30/2019   Hx of adenomatous polyp of colon 07/08/2014   Prediabetes 04/07/2013   Low back pain     PCP: Butler Burr, MD  REFERRING PROVIDER:  Jerri Kay HERO, MD      REFERRING DIAG:  M16.11 (ICD-10-CM) - Primary osteoarthritis of right hip   Per op note: Operative Findings Severe OA Successful correction of leg length discrepancy   Operative  Procedures  1. Total hip replacement; Right hip; uncemented [id number]   Rationale for Evaluation and Treatment: Rehabilitation  THERAPY DIAG:  Pain in right hip  Difficulty in walking, not elsewhere classified  Muscle weakness (generalized)  ONSET DATE: DOS 04/06/24   SUBJECTIVE:  SUBJECTIVE STATEMENT: I am doing really well. Today is my last day!  PERTINENT HISTORY:  no  PAIN:  Are you having pain? Yes: NPRS scale: 0/10 Pain location: Rt  lateral and anterior distal thigh Pain description: discomfort Aggravating factors: stretching quads Relieving factors: rest  PRECAUTIONS:  None  RED FLAGS: None   WEIGHT BEARING RESTRICTIONS:  No  FALLS:  Has patient fallen in last 6 months? No  LIVING ENVIRONMENT: Has a basement with stairs  OCCUPATION:  Works on trucks, has to get up/down from floor.  Would like to start going in next week and back to some work after thanksgiving.   PLOF:  Independent  PATIENT GOALS:  Back to working on trucks   OBJECTIVE:  Note: Objective measures were completed at Evaluation unless otherwise noted.   PATIENT SURVEYS:  LEFS  Extreme difficulty/unable (0), Quite a bit of difficulty (1), Moderate difficulty (2), Little difficulty (3), No difficulty (4) Survey date:  eval 12/18 12/31  Any of your usual work, housework or school activities 2 3   2. Usual hobbies, recreational or sporting activities  4   3. Getting into/out of the bath 2 4   4. Walking between rooms 3 4   5. Putting on socks/shoes 1 3   6. Squatting   2   7. Lifting an object, like a bag of groceries from the floor  4   8. Performing light activities around your home 2 4   9. Performing heavy activities around your home  4   10. Getting into/out of a car 3 4   11. Walking 2  blocks  4   12. Walking 1 mile  4   13. Going up/down 10 stairs (1 flight)  4   14. Standing for 1 hour  4   15.  sitting for 1 hour 2 4   16. Running on even ground  0   17. Running on uneven ground  0   18. Making sharp turns while running fast  0   19. Hopping   4   20. Rolling over in bed 3 4   Score total:  18/80 60/80 78/80      COGNITIVE STATUS: Within functional limits for tasks assessed   SENSATION: WFL  EDEMA:  No  POSTURE:  Eval: trunk flexion upon standing  GAIT: Comments: EVAL  trunk flexion, lacking hip extension on right   Body Part #1 Hip    LOWER EXTREMITY ROM:     Active  Right eval MMT  Rt/Lt 05/20/24  Hip flexion 90 58.5/59.1  Hip extension 0   Hip abduction  55.6/45.0  Hip adduction    Hip internal rotation    Hip external rotation    Knee flexion  60+  Knee extension  60+   (Blank rows = not tested)   TREATMENT DATE:   05/12/24 STM Lt post hip Clams with slow lower Seated hip hinge- deep breath at end range 6 fwd and lateral step up Chair tap & band pull Lateral step up airex Knee flx+IR+AP capsule mobs Qped rock back with hip IR for capsule stretch, neutral for lumbar stretch Incision mobilization   05/07/24 Lateral step down Heel raises, ball bw ankles Squat tap chair Wall squat SLS on airex with head turns Low side steps over airex Seated hinge- specific to donning socks/shoes  05/04/24 Nu step 5 min L9 UE + LE Shuttle 4*25 narrow & wide leg press Hip hinge- single and double leg, +10lb kettle  bell Half kneel kettlebell pick up Half kneel lunge forward with Rt foot forward Sit<>stand Rt foot on airex- Lt gentle support on toes Rt SLS on airex with lateral taps of Lt foot Sidelying clam, + reverse clam  12/12 Nu step L7 Ue & LE Up/down from floor TrX: squat, lunge, hip hinge single and double leg Kettle bell dead lift Quadruped: bird dog, rocking, lateral stepping  12/8 Nu step L6 5 min Forwrad step  with Lt foot with right glut set for anterior hip opening Supine mod thomas, foot on floor, rolling quads Passive prone quad stretch Supine wide/DF bridge Lunges pull off of counter Gait training with trunk rotation Lunge & half kneel on Rt Lt trunk rotation resisted by blue tband  12/1 Nu step L8 5 min Slant board squats DF & PF Sit<>stand with Lt foot fwd- 10lb kettle bell at chest  Airex church pew, chest press 5 lb kettle bell   04/18/24 Nu step 5 min L4 Seated hip hinge Seated hamstring stretch Sit<>stand pause at hinge, hover and seated hinge Gait- glut activation and reducing forward trunk lean at stance phase Prone alt hip ext Standing lunge slide back at wall- red tband  04/14/24 Nu step L4 4 min Discussed shoe- needs wider BOS for collapsing arch and Rt foot inversion through stance phase Seated add with LAQ to HS curl Self roller to quads in seated Practiced in/out of bed, in/out of car, dressing Seated hip hinge with ball bw knees Toe scrunches & toe yoga    PATIENT EDUCATION:  Education details: Anatomy of condition, POC, HEP, exercise form/rationale Person educated: Patient and Spouse Education method: Explanation, Demonstration, Tactile cues, Verbal cues, and Handouts Education comprehension: verbalized understanding, returned demonstration, verbal cues required, tactile cues required, and needs further education   HOME EXERCISE PROGRAM: Access Code: [email or web address] URL: [email or web address] Date: 04/11/2024 Prepared by: Harlene Cordon    ASSESSMENT:  CLINICAL IMPRESSION: Pt has met all of his goals at this time and is prepared for d/c to independent program. Discussed continued care over at least a year and being mindful of equality of hips. Discussed HEP and provided with printout. Encouraged pt to reach out to me with any questions.   REHAB POTENTIAL: Good  CLINICAL DECISION MAKING: Stable/uncomplicated  EVALUATION COMPLEXITY:  Low   GOALS: Goals reviewed with patient? Yes  SHORT TERM GOALS: Target date: 12/13  Household ambulation without AD, without increased pain Baseline: Goal status: MET  2.  Demo hip ext at least 10 deg for normalized gait pattern Baseline:  Goal status: MET    LONG TERM GOALS: Target date: POC date  Lunge to/from floor with UE assist for balance Baseline:  Goal status: MET  2.  Hip abd strength 80% of opposite LE Baseline:  Goal status: MET  3.  LEFS to improve by MDC x2 Baseline:  Goal status: MET  4.  Community ambulation without AD, without antalgic pattern Baseline:  Goal status: MET  5.  5TSTS 12s or less without UE use Baseline: 9s on 12/31 Goal status: MET    PLAN:  PT FREQUENCY: 1-2x/week  PT DURATION: POC date  PLANNED INTERVENTIONS: 97164- PT Re-evaluation, 97750- Physical Performance Testing, 97110-Therapeutic exercises, 97530- Therapeutic activity, 97112- Neuromuscular re-education, 97535- Self Care, [id number]- Manual therapy, [id number]- Gait training, [phone]- Aquatic Therapy, [phone] (1-2 muscles), [id number] (3+ muscles)- Dry Needling, Patient/Family education, Balance training, Stair training, Taping, Joint mobilization, Spinal mobilization, Scar mobilization, and Cryotherapy.    Harlene Cordon, PT, DPT 05/20/2024, 10:05  AM  "

## 2024-05-20 NOTE — Progress Notes (Signed)
 "  Post-Op Visit Note   Patient: Mike Perez           Date of Birth: [birth year]           MRN: [id number] Visit Date: 05/20/2024 PCP: Duanne Butler DASEN, MD   Assessment & Plan:  Chief Complaint:  Chief Complaint  Patient presents with   Right Hip - Routine Post Op    R THA- 04/06/2024   Visit Diagnoses:  1. Status post total replacement of right hip     Plan: Patient is a pleasant 63 year old gentleman comes in today 6 weeks status post right total hip replacement.  He has been doing well.  No complaints.  He is walking without any assistive devices.  He has finished his baby aspirin  for which she was taking for DVT prophylaxis.  Examination of the right hip reveals a fully healed surgical scar without complication.  Painless hip flexion and logroll.  He is neurovascular intact distally.  At this point, continue to advance with activity as tolerated.  Dental prophylaxis reinforced.  Follow-up in 6 weeks for recheck.  Call with concerns or questions.  Follow-Up Instructions: Return in about 6 weeks (around 07/01/2024).   Orders:  Orders Placed This Encounter  Procedures   XR HIP UNILAT W OR W/O PELVIS 2-3 VIEWS RIGHT   No orders of the defined types were placed in this encounter.   Imaging: No results found.  PMFS History: Patient Active Problem List   Diagnosis Date Noted   Status post total replacement of right hip 04/06/2024   Ascending aortic aneurysm 02/26/2022   Conductive hearing loss, bilateral 12/07/2021   Bilateral impacted cerumen 12/07/2021   Low testosterone  in male 06/22/2021   Central serous retinopathy 02/07/2021   Primary osteoarthritis of right hip 10/30/2019   Primary osteoarthritis of left hip 10/30/2019   Tendinosis of rotator cuff 10/30/2019   Tendinopathy of right biceps tendon 10/30/2019   Hx of adenomatous polyp of colon 07/08/2014   Prediabetes 04/07/2013   Low back pain    Past Medical History:  Diagnosis Date   Arthritis     bilateral,hands,hips   Ascending aortic aneurysm    monitored yearly thru PCP   Hx of adenomatous polyp of colon 07/08/2014   Hyperlipidemia    Low back pain     Family History  Problem Relation Age of Onset   Diabetes Father    Colon polyps Brother    Alcohol abuse Brother    Hyperlipidemia Brother    Colon cancer Maternal Grandmother    Esophageal cancer Neg Hx    Rectal cancer Neg Hx    Stomach cancer Neg Hx    Crohn's disease Neg Hx     Past Surgical History:  Procedure Laterality Date   COLONOSCOPY  09/17/2003   normal dr Vicci   HERNIA REPAIR     1971   POLYPECTOMY     TOTAL HIP ARTHROPLASTY Right 04/06/2024   Procedure: ARTHROPLASTY, HIP, TOTAL, ANTERIOR APPROACH;  Surgeon: Jerri Kay HERO, MD;  Location: [facility] OR;  Service: Orthopedics;  Laterality: Right;  3-C   Social History   Occupational History   Not on file  Tobacco Use   Smoking status: Never    Passive exposure: Never   Smokeless tobacco: Never  Vaping Use   Vaping status: Never Used  Substance and Sexual Activity   Alcohol use: No    Alcohol/week: 0.0 standard drinks of alcohol   Drug use: No   Sexual  activity: Yes    Comment: married.  Works in data processing manager, it trainer

## 2024-05-23 ENCOUNTER — Encounter (HOSPITAL_BASED_OUTPATIENT_CLINIC_OR_DEPARTMENT_OTHER): Admitting: Physical Therapy

## 2024-05-27 ENCOUNTER — Other Ambulatory Visit: Payer: Self-pay | Admitting: Family Medicine

## 2024-05-28 ENCOUNTER — Encounter: Payer: Self-pay | Admitting: Family Medicine

## 2024-05-29 ENCOUNTER — Other Ambulatory Visit: Payer: Self-pay

## 2024-05-29 MED ORDER — TIRZEPATIDE-WEIGHT MANAGEMENT 5 MG/0.5ML ~~LOC~~ SOLN: 5.0000 mg | SUBCUTANEOUS | 1 refills | Status: AC

## 2024-08-03 ENCOUNTER — Other Ambulatory Visit

## 2024-08-06 ENCOUNTER — Encounter: Admitting: Family Medicine
